# Patient Record
Sex: Female | Born: 1950 | Race: Black or African American | Hispanic: No | Marital: Married | Smoking: Former smoker
Health system: Southern US, Community
[De-identification: ages and names within clinical notes are randomized; demographics above are authoritative.]

## PROBLEM LIST (undated history)

## (undated) DIAGNOSIS — R12 Heartburn: Secondary | ICD-10-CM

## (undated) DIAGNOSIS — D122 Benign neoplasm of ascending colon: Secondary | ICD-10-CM

## (undated) DIAGNOSIS — E785 Hyperlipidemia, unspecified: Secondary | ICD-10-CM

## (undated) DIAGNOSIS — D529 Folate deficiency anemia, unspecified: Secondary | ICD-10-CM

## (undated) DIAGNOSIS — R1310 Dysphagia, unspecified: Secondary | ICD-10-CM

## (undated) DIAGNOSIS — D649 Anemia, unspecified: Secondary | ICD-10-CM

## (undated) DIAGNOSIS — D124 Benign neoplasm of descending colon: Secondary | ICD-10-CM

## (undated) DIAGNOSIS — R1319 Other dysphagia: Secondary | ICD-10-CM

## (undated) DIAGNOSIS — D125 Benign neoplasm of sigmoid colon: Secondary | ICD-10-CM

## (undated) DIAGNOSIS — I10 Essential (primary) hypertension: Secondary | ICD-10-CM

## (undated) DIAGNOSIS — M1990 Unspecified osteoarthritis, unspecified site: Secondary | ICD-10-CM

## (undated) DIAGNOSIS — J449 Chronic obstructive pulmonary disease, unspecified: Secondary | ICD-10-CM

## (undated) DIAGNOSIS — K219 Gastro-esophageal reflux disease without esophagitis: Secondary | ICD-10-CM

## (undated) HISTORY — DX: Benign neoplasm of sigmoid colon: D12.5

## (undated) HISTORY — DX: Heartburn: R12

## (undated) HISTORY — DX: Benign neoplasm of descending colon: D12.4

## (undated) HISTORY — DX: Anemia, unspecified: D64.9

## (undated) HISTORY — DX: Hyperlipidemia, unspecified: E78.5

## (undated) HISTORY — DX: Dysphagia, unspecified: R13.10

## (undated) HISTORY — DX: Chronic obstructive pulmonary disease, unspecified: J44.9

## (undated) HISTORY — DX: Benign neoplasm of ascending colon: D12.2

## (undated) HISTORY — DX: Gastro-esophageal reflux disease without esophagitis: K21.9

## (undated) HISTORY — DX: Folate deficiency anemia, unspecified: D52.9

## (undated) HISTORY — PX: ECTOPIC PREGNANCY SURGERY: SHX613

## (undated) HISTORY — DX: Essential (primary) hypertension: I10

## (undated) HISTORY — DX: Other dysphagia: R13.19

---

## 1999-06-15 ENCOUNTER — Emergency Department (HOSPITAL_COMMUNITY): Admission: EM | Admit: 1999-06-15 | Discharge: 1999-06-15 | Payer: Self-pay | Admitting: Emergency Medicine

## 1999-06-22 ENCOUNTER — Encounter: Admission: RE | Admit: 1999-06-22 | Discharge: 1999-06-22 | Payer: Self-pay | Admitting: Family Medicine

## 1999-06-22 ENCOUNTER — Encounter: Admission: RE | Admit: 1999-06-22 | Discharge: 1999-07-24 | Payer: Self-pay | Admitting: Family Medicine

## 1999-06-22 ENCOUNTER — Encounter: Payer: Self-pay | Admitting: Family Medicine

## 2005-05-10 ENCOUNTER — Ambulatory Visit: Payer: Self-pay

## 2005-11-22 ENCOUNTER — Ambulatory Visit: Payer: Self-pay

## 2007-01-05 ENCOUNTER — Ambulatory Visit: Payer: Self-pay | Admitting: Family Medicine

## 2009-02-10 ENCOUNTER — Ambulatory Visit: Payer: Self-pay | Admitting: Family Medicine

## 2009-05-20 ENCOUNTER — Ambulatory Visit: Payer: Self-pay | Admitting: Family Medicine

## 2010-02-18 ENCOUNTER — Ambulatory Visit: Payer: Self-pay | Admitting: Family Medicine

## 2010-03-24 ENCOUNTER — Ambulatory Visit: Payer: Self-pay | Admitting: Family Medicine

## 2011-01-08 ENCOUNTER — Ambulatory Visit: Payer: Self-pay | Admitting: Family Medicine

## 2011-05-18 ENCOUNTER — Ambulatory Visit: Payer: Self-pay | Admitting: Family Medicine

## 2014-09-02 ENCOUNTER — Other Ambulatory Visit: Payer: Self-pay | Admitting: Family Medicine

## 2014-09-02 DIAGNOSIS — E785 Hyperlipidemia, unspecified: Secondary | ICD-10-CM

## 2014-09-02 DIAGNOSIS — I10 Essential (primary) hypertension: Secondary | ICD-10-CM

## 2014-10-03 ENCOUNTER — Other Ambulatory Visit: Payer: Self-pay | Admitting: Family Medicine

## 2014-10-03 DIAGNOSIS — E785 Hyperlipidemia, unspecified: Secondary | ICD-10-CM

## 2014-10-03 DIAGNOSIS — I10 Essential (primary) hypertension: Secondary | ICD-10-CM

## 2014-10-04 ENCOUNTER — Other Ambulatory Visit: Payer: Self-pay | Admitting: Family Medicine

## 2014-10-04 DIAGNOSIS — I10 Essential (primary) hypertension: Secondary | ICD-10-CM

## 2014-10-18 ENCOUNTER — Other Ambulatory Visit: Payer: Self-pay | Admitting: Family Medicine

## 2014-10-18 DIAGNOSIS — I10 Essential (primary) hypertension: Secondary | ICD-10-CM

## 2014-10-18 DIAGNOSIS — E785 Hyperlipidemia, unspecified: Secondary | ICD-10-CM

## 2014-10-29 ENCOUNTER — Ambulatory Visit (INDEPENDENT_AMBULATORY_CARE_PROVIDER_SITE_OTHER): Payer: BC Managed Care – PPO | Admitting: Family Medicine

## 2014-10-29 ENCOUNTER — Encounter: Payer: Self-pay | Admitting: Family Medicine

## 2014-10-29 VITALS — BP 110/80 | HR 80 | Ht 67.0 in | Wt 153.0 lb

## 2014-10-29 DIAGNOSIS — R69 Illness, unspecified: Secondary | ICD-10-CM

## 2014-10-29 DIAGNOSIS — D520 Dietary folate deficiency anemia: Secondary | ICD-10-CM

## 2014-10-29 DIAGNOSIS — Z1239 Encounter for other screening for malignant neoplasm of breast: Secondary | ICD-10-CM | POA: Diagnosis not present

## 2014-10-29 DIAGNOSIS — E785 Hyperlipidemia, unspecified: Secondary | ICD-10-CM

## 2014-10-29 DIAGNOSIS — I10 Essential (primary) hypertension: Secondary | ICD-10-CM | POA: Diagnosis not present

## 2014-10-29 DIAGNOSIS — Z1211 Encounter for screening for malignant neoplasm of colon: Secondary | ICD-10-CM

## 2014-10-29 DIAGNOSIS — G629 Polyneuropathy, unspecified: Secondary | ICD-10-CM

## 2014-10-29 LAB — HEMOCCULT GUIAC POC 1CARD (OFFICE): Fecal Occult Blood, POC: NEGATIVE

## 2014-10-29 MED ORDER — METOPROLOL SUCCINATE ER 50 MG PO TB24: 50.0000 mg | ORAL_TABLET | Freq: Every day | ORAL | Status: DC

## 2014-10-29 MED ORDER — LOSARTAN POTASSIUM 100 MG PO TABS: 100.0000 mg | ORAL_TABLET | Freq: Every day | ORAL | Status: DC

## 2014-10-29 MED ORDER — PRAVASTATIN SODIUM 40 MG PO TABS: 40.0000 mg | ORAL_TABLET | Freq: Every day | ORAL | Status: DC

## 2014-10-29 MED ORDER — FOLIC ACID 0.8 MG PO CAPS: 0.4000 mg | ORAL_CAPSULE | Freq: Every day | ORAL | Status: DC

## 2014-10-29 NOTE — Progress Notes (Signed)
Name: Taylor Griffin   MRN: [id number]    DOB: August 08, 1950   Date:10/29/2014       Progress Note  Subjective  Chief Complaint  Chief Complaint  Patient presents with  . Hypertension  . Hyperlipidemia  . Anemia    Hypertension This is a chronic problem. The current episode started more than 1 year ago. The problem has been gradually improving since onset. The problem is controlled. Pertinent negatives include no anxiety, blurred vision, chest pain, headaches, malaise/fatigue, neck pain, orthopnea, palpitations, peripheral edema, PND, shortness of breath or sweats. There are no associated agents to hypertension. Risk factors for coronary artery disease include dyslipidemia, diabetes mellitus, post-menopausal state and smoking/tobacco exposure. Past treatments include angiotensin blockers and beta blockers. The current treatment provides no improvement. There are no compliance problems.  There is no history of angina, kidney disease, CAD/MI, CVA, heart failure, left ventricular hypertrophy, PVD, renovascular disease or retinopathy. There is no history of chronic renal disease.  Hyperlipidemia This is a chronic problem. The current episode started more than 1 year ago. The problem is controlled. Recent lipid tests were reviewed and are normal. She has no history of chronic renal disease, diabetes, hypothyroidism, liver disease, obesity or nephrotic syndrome. Factors aggravating her hyperlipidemia include beta blockers. Associated symptoms include a focal sensory loss. Pertinent negatives include no chest pain, focal weakness, leg pain, myalgias or shortness of breath. She is currently on no antihyperlipidemic treatment. The current treatment provides moderate improvement of lipids. There are no compliance problems.   Anemia Presents for follow-up visit. There has been no abdominal pain, anorexia, bruising/bleeding easily, confusion, fever, leg swelling, light-headedness, malaise/fatigue, pallor,  palpitations, paresthesias, pica or weight loss. Signs of blood loss that are not present include hematemesis, hematochezia, melena and menorrhagia. Past treatments include folic acid. There is no history of alcohol abuse, cancer, chronic liver disease, chronic renal disease, clotting disorder, dementia, heart failure, hemoglobinopathy, HIV/AIDS, hypothyroidism, inflammatory bowel disease, malabsorption, malnutrition, neuropathy, recent illness, recent surgery, recent trauma or rheumatic disease.  Neurologic Problem The patient's primary symptoms include focal sensory loss. The patient's pertinent negatives include no focal weakness, loss of balance or weakness. This is a recurrent problem. The current episode started more than 1 year ago. The neurological problem developed gradually. The problem is unchanged. There was lower extremity focality noted. Pertinent negatives include no abdominal pain, back pain, chest pain, confusion, dizziness, fever, headaches, light-headedness, nausea, neck pain, palpitations or shortness of breath. Past treatments include nothing. There is no history of a clotting disorder or liver disease.    No problem-specific assessment & plan notes found for this encounter.   Past Medical History  Diagnosis Date  . Anemia   . Hyperlipidemia   . Hypertension     Past Surgical History  Procedure Laterality Date  . Ectopic pregnancy surgery      Family History  Problem Relation Age of Onset  . Diabetes Mother   . Hypertension Mother     Social History   Social History  . Marital Status: Married    Spouse Name: N/A  . Number of Children: N/A  . Years of Education: N/A   Occupational History  . Not on file.   Social History Main Topics  . Smoking status: Former Research scientist (life sciences)  . Smokeless tobacco: Not on file  . Alcohol Use: No  . Drug Use: No  . Sexual Activity: No   Other Topics Concern  . Not on file   Social History Narrative  .  No narrative on file     No Known Allergies   Review of Systems  Constitutional: Negative for fever, chills, weight loss and malaise/fatigue.  HENT: Negative for ear discharge, ear pain and sore throat.   Eyes: Negative for blurred vision.  Respiratory: Negative for cough, sputum production, shortness of breath and wheezing.   Cardiovascular: Negative for chest pain, palpitations, orthopnea, leg swelling and PND.  Gastrointestinal: Negative for heartburn, nausea, abdominal pain, diarrhea, constipation, blood in stool, melena, hematochezia, anorexia and hematemesis.  Genitourinary: Negative for dysuria, urgency, frequency, hematuria and menorrhagia.  Musculoskeletal: Negative for myalgias, back pain, joint pain and neck pain.  Skin: Negative for pallor and rash.  Neurological: Negative for dizziness, tingling, sensory change, focal weakness, weakness, light-headedness, headaches, paresthesias and loss of balance.  Endo/Heme/Allergies: Negative for environmental allergies and polydipsia. Does not bruise/bleed easily.  Psychiatric/Behavioral: Negative for depression, suicidal ideas and confusion. The patient is not nervous/anxious and does not have insomnia.      Objective  Filed Vitals:   10/29/14 0936  BP: 110/80  Pulse: 80  Height: 5\' 7"  (1.702 m)  Weight: 153 lb (69.4 kg)    Physical Exam  Constitutional: She is well-developed, well-nourished, and in no distress. No distress.  HENT:  Head: Normocephalic and atraumatic.  Right Ear: External ear normal.  Left Ear: External ear normal.  Nose: Nose normal.  Mouth/Throat: Oropharynx is clear and moist.  Eyes: Conjunctivae and EOM are normal. Pupils are equal, round, and reactive to light. Right eye exhibits no discharge. Left eye exhibits no discharge.  Neck: Normal range of motion. Neck supple. No JVD present. No thyromegaly present.  Cardiovascular: Normal rate, regular rhythm, normal heart sounds and intact distal pulses.  Exam reveals no gallop  and no friction rub.   No murmur heard. Pulmonary/Chest: Effort normal and breath sounds normal. Right breast exhibits no mass and no tenderness. Left breast exhibits no mass and no tenderness. Breasts are symmetrical.  Abdominal: Soft. Bowel sounds are normal. She exhibits no mass. There is no splenomegaly or hepatomegaly. There is no tenderness. There is no guarding.  Genitourinary: Rectum normal. Rectal exam shows no external hemorrhoid, no internal hemorrhoid, no mass and no tenderness. Guaiac negative stool.  Musculoskeletal: Normal range of motion. She exhibits no edema.  Lymphadenopathy:    She has no cervical adenopathy.  Neurological: She is alert. She has normal reflexes.  Skin: Skin is warm and dry. She is not diaphoretic.  Psychiatric: Mood and affect normal.  Nursing note and vitals reviewed.     Assessment & Plan  Problem List Items Addressed This Visit    None    Visit Diagnoses    Essential hypertension    -  Primary    Relevant Medications    aspirin 81 MG tablet    losartan (COZAAR) 100 MG tablet    metoprolol succinate (TOPROL-XL) 50 MG 24 hr tablet    pravastatin (PRAVACHOL) 40 MG tablet    Other Relevant Orders    Renal Function Panel    POCT Urinalysis Dipstick    Hyperlipidemia        Relevant Medications    aspirin 81 MG tablet    losartan (COZAAR) 100 MG tablet    metoprolol succinate (TOPROL-XL) 50 MG 24 hr tablet    pravastatin (PRAVACHOL) 40 MG tablet    Other Relevant Orders    Lipid Profile    Dietary folate deficiency anemia        Relevant Medications  Folic Acid 0.8 MG CAPS    Other Relevant Orders    CBC w/Diff/Platelet    Neuropathy        Relevant Orders    CBC w/Diff/Platelet    Taking medication for chronic disease        Relevant Orders    Hepatic function panel    Breast cancer screening        Relevant Orders    MM Digital Screening    Colon cancer screening        Relevant Orders    POCT Occult Blood Stool     Ambulatory referral to Gastroenterology         Dr. Otilio Miu [facility]  10/29/2014

## 2014-10-30 LAB — CBC WITH DIFFERENTIAL/PLATELET
BASOS ABS: 0.1 10*3/uL (ref 0.0–0.2)
BASOS: 1 %
EOS (ABSOLUTE): 0.3 10*3/uL (ref 0.0–0.4)
EOS: 5 %
HEMATOCRIT: 43.3 % (ref 34.0–46.6)
HEMOGLOBIN: 14.5 g/dL (ref 11.1–15.9)
IMMATURE GRANS (ABS): 0 10*3/uL (ref 0.0–0.1)
Immature Granulocytes: 0 %
LYMPHS ABS: 2.3 10*3/uL (ref 0.7–3.1)
LYMPHS: 43 %
MCH: 31.2 pg (ref 26.6–33.0)
MCHC: 33.5 g/dL (ref 31.5–35.7)
MCV: 93 fL (ref 79–97)
MONOCYTES: 7 %
Monocytes Absolute: 0.4 10*3/uL (ref 0.1–0.9)
NEUTROS ABS: 2.3 10*3/uL (ref 1.4–7.0)
Neutrophils: 44 %
Platelets: 284 10*3/uL (ref 150–379)
RBC: 4.65 x10E6/uL (ref 3.77–5.28)
RDW: 14.3 % (ref 12.3–15.4)
WBC: 5.3 10*3/uL (ref 3.4–10.8)

## 2014-10-30 LAB — LIPID PANEL
Chol/HDL Ratio: 4.9 ratio units — ABNORMAL HIGH (ref 0.0–4.4)
Cholesterol, Total: 241 mg/dL — ABNORMAL HIGH (ref 100–199)
HDL: 49 mg/dL (ref >39)
LDL Calculated: 155 mg/dL — ABNORMAL HIGH (ref 0–99)
TRIGLYCERIDES: 184 mg/dL — AB (ref 0–149)
VLDL Cholesterol Cal: 37 mg/dL (ref 5–40)

## 2014-10-30 LAB — RENAL FUNCTION PANEL: PHOSPHORUS: 3.6 mg/dL (ref 2.5–4.5)

## 2014-10-30 LAB — HEPATIC FUNCTION PANEL: BILIRUBIN, DIRECT: 0.18 mg/dL (ref 0.00–0.40)

## 2014-11-05 ENCOUNTER — Telehealth: Payer: Self-pay | Admitting: Gastroenterology

## 2014-11-05 ENCOUNTER — Other Ambulatory Visit: Payer: Self-pay

## 2014-11-05 ENCOUNTER — Ambulatory Visit
Admission: RE | Admit: 2014-11-05 | Discharge: 2014-11-05 | Disposition: A | Discharge status: Home / Self Care | Payer: BC Managed Care – PPO | Source: Ambulatory Visit | Attending: Family Medicine | Admitting: Family Medicine

## 2014-11-05 DIAGNOSIS — Z1212 Encounter for screening for malignant neoplasm of rectum: Principal | ICD-10-CM

## 2014-11-05 DIAGNOSIS — Z1211 Encounter for screening for malignant neoplasm of colon: Secondary | ICD-10-CM

## 2014-11-05 DIAGNOSIS — Z1231 Encounter for screening mammogram for malignant neoplasm of breast: Secondary | ICD-10-CM | POA: Insufficient documentation

## 2014-11-05 DIAGNOSIS — Z1239 Encounter for other screening for malignant neoplasm of breast: Secondary | ICD-10-CM

## 2014-11-05 NOTE — Telephone Encounter (Signed)
Gastroenterology Pre-Procedure Review  Request Date: 11-22-2014 Requesting Physician: Dr. Ronnald Ramp  PATIENT REVIEW QUESTIONS: The patient responded to the following health history questions as indicated:    1. Are you having any GI issues? no 2. Do you have a personal history of Polyps? no 3. Do you have a family history of Colon Cancer or Polyps? no 4. Diabetes Mellitus? no 5. Joint replacements in the past 12 months?no 6. Major health problems in the past 3 months?no 7. Any artificial heart valves, MVP, or defibrillator?no    MEDICATIONS & ALLERGIES:    Patient reports the following regarding taking any anticoagulation/antiplatelet therapy:   Plavix, Coumadin, Eliquis, Xarelto, Lovenox, Pradaxa, Brilinta, or Effient? no Aspirin? yes (Daily)  Patient confirms/reports the following medications:  Current Outpatient Prescriptions  Medication Sig Dispense Refill   aspirin 81 MG tablet Take 81 mg by mouth daily.     Folic Acid 0.8 MG CAPS Take 0.5 capsules (0.4 mg total) by mouth daily at 6 (six) AM. 30 each 6   losartan (COZAAR) 100 MG tablet Take 1 tablet (100 mg total) by mouth daily. 30 tablet 6   metoprolol succinate (TOPROL-XL) 50 MG 24 hr tablet Take 1 tablet (50 mg total) by mouth daily. Take with or immediately following a meal. 30 tablet 6   pravastatin (PRAVACHOL) 40 MG tablet Take 1 tablet (40 mg total) by mouth daily. 30 tablet 6   No current facility-administered medications for this visit.    Patient confirms/reports the following allergies:  No Known Allergies  No orders of the defined types were placed in this encounter.    AUTHORIZATION INFORMATION Primary Insurance: 1D#: Group #:  Secondary Insurance: 1D#: Group #:  SCHEDULE INFORMATION: Date: 11-22-2014 Time: Location:[facility]

## 2014-11-21 NOTE — Discharge Instructions (Signed)

## 2014-11-22 ENCOUNTER — Ambulatory Visit: Payer: BC Managed Care – PPO | Admitting: Anesthesiology

## 2014-11-22 ENCOUNTER — Encounter
Admission: RE | Disposition: A | Discharge status: Home / Self Care | Payer: Self-pay | Source: Ambulatory Visit | Attending: Gastroenterology

## 2014-11-22 ENCOUNTER — Other Ambulatory Visit: Payer: Self-pay | Admitting: Gastroenterology

## 2014-11-22 ENCOUNTER — Ambulatory Visit
Admission: RE | Admit: 2014-11-22 | Discharge: 2014-11-22 | Disposition: A | Discharge status: Home / Self Care | Payer: BC Managed Care – PPO | Source: Ambulatory Visit | Attending: Gastroenterology | Admitting: Gastroenterology

## 2014-11-22 DIAGNOSIS — F17210 Nicotine dependence, cigarettes, uncomplicated: Secondary | ICD-10-CM | POA: Diagnosis not present

## 2014-11-22 DIAGNOSIS — Z79899 Other long term (current) drug therapy: Secondary | ICD-10-CM | POA: Diagnosis not present

## 2014-11-22 DIAGNOSIS — D649 Anemia, unspecified: Secondary | ICD-10-CM | POA: Diagnosis not present

## 2014-11-22 DIAGNOSIS — E785 Hyperlipidemia, unspecified: Secondary | ICD-10-CM | POA: Diagnosis not present

## 2014-11-22 DIAGNOSIS — D125 Benign neoplasm of sigmoid colon: Secondary | ICD-10-CM

## 2014-11-22 DIAGNOSIS — Z1211 Encounter for screening for malignant neoplasm of colon: Secondary | ICD-10-CM | POA: Diagnosis present

## 2014-11-22 DIAGNOSIS — K640 First degree hemorrhoids: Secondary | ICD-10-CM | POA: Diagnosis not present

## 2014-11-22 DIAGNOSIS — Z9889 Other specified postprocedural states: Secondary | ICD-10-CM | POA: Insufficient documentation

## 2014-11-22 DIAGNOSIS — I10 Essential (primary) hypertension: Secondary | ICD-10-CM | POA: Insufficient documentation

## 2014-11-22 DIAGNOSIS — Z833 Family history of diabetes mellitus: Secondary | ICD-10-CM | POA: Diagnosis not present

## 2014-11-22 DIAGNOSIS — Z7982 Long term (current) use of aspirin: Secondary | ICD-10-CM | POA: Insufficient documentation

## 2014-11-22 DIAGNOSIS — D124 Benign neoplasm of descending colon: Secondary | ICD-10-CM | POA: Diagnosis not present

## 2014-11-22 DIAGNOSIS — K635 Polyp of colon: Secondary | ICD-10-CM | POA: Insufficient documentation

## 2014-11-22 DIAGNOSIS — Z8249 Family history of ischemic heart disease and other diseases of the circulatory system: Secondary | ICD-10-CM | POA: Insufficient documentation

## 2014-11-22 DIAGNOSIS — D122 Benign neoplasm of ascending colon: Secondary | ICD-10-CM | POA: Diagnosis not present

## 2014-11-22 DIAGNOSIS — M1990 Unspecified osteoarthritis, unspecified site: Secondary | ICD-10-CM | POA: Insufficient documentation

## 2014-11-22 DIAGNOSIS — K5730 Diverticulosis of large intestine without perforation or abscess without bleeding: Secondary | ICD-10-CM | POA: Insufficient documentation

## 2014-11-22 HISTORY — PX: COLONOSCOPY WITH PROPOFOL: SHX5780

## 2014-11-22 HISTORY — DX: Unspecified osteoarthritis, unspecified site: M19.90

## 2014-11-22 SURGERY — COLONOSCOPY WITH PROPOFOL
Anesthesia: Monitor Anesthesia Care | Wound class: Contaminated

## 2014-11-22 MED ORDER — LACTATED RINGERS IV SOLN
INTRAVENOUS | Status: DC
  Administered 2014-11-22 (×2): via INTRAVENOUS

## 2014-11-22 MED ORDER — OXYCODONE HCL 5 MG/5ML PO SOLN: 5.0000 mg | Freq: Once | ORAL | Status: DC | PRN

## 2014-11-22 MED ORDER — PROPOFOL 10 MG/ML IV BOLUS
INTRAVENOUS | Status: DC | PRN
  Administered 2014-11-22: 80 mg via INTRAVENOUS
  Administered 2014-11-22: 40 mg via INTRAVENOUS
  Administered 2014-11-22: 80 mg via INTRAVENOUS

## 2014-11-22 MED ORDER — DEXAMETHASONE SODIUM PHOSPHATE 4 MG/ML IJ SOLN
8.0000 mg | Freq: Once | INTRAMUSCULAR | Status: DC | PRN
Start: 2014-11-22 — End: 2014-11-22

## 2014-11-22 MED ORDER — ACETAMINOPHEN 325 MG PO TABS: 325.0000 mg | ORAL_TABLET | ORAL | Status: DC | PRN

## 2014-11-22 MED ORDER — ACETAMINOPHEN 160 MG/5ML PO SOLN: 325.0000 mg | ORAL | Status: DC | PRN

## 2014-11-22 MED ORDER — FENTANYL CITRATE (PF) 100 MCG/2ML IJ SOLN: 25.0000 ug | INTRAMUSCULAR | Status: DC | PRN

## 2014-11-22 MED ORDER — STERILE WATER FOR IRRIGATION IR SOLN
Status: DC | PRN
  Administered 2014-11-22: 08:00:00

## 2014-11-22 MED ORDER — OXYCODONE HCL 5 MG PO TABS: 5.0000 mg | ORAL_TABLET | Freq: Once | ORAL | Status: DC | PRN

## 2014-11-22 MED ORDER — LACTATED RINGERS IV SOLN: 500.0000 mL | INTRAVENOUS | Status: DC

## 2014-11-22 MED ORDER — LIDOCAINE HCL (CARDIAC) 20 MG/ML IV SOLN
INTRAVENOUS | Status: DC | PRN
  Administered 2014-11-22: 30 mg via INTRAVENOUS

## 2014-11-22 SURGICAL SUPPLY — 28 items
CANISTER SUCT 1200ML W/VALVE (MISCELLANEOUS) ×2 IMPLANT
FCP ESCP3.2XJMB 240X2.8X (MISCELLANEOUS)
FORCEPS BIOP RAD 4 LRG CAP 4 (CUTTING FORCEPS) ×2 IMPLANT
FORCEPS BIOP RJ4 240 W/NDL (MISCELLANEOUS)
FORCEPS ESCP3.2XJMB 240X2.8X (MISCELLANEOUS) IMPLANT
GOWN CVR UNV OPN BCK APRN NK (MISCELLANEOUS) ×2 IMPLANT
GOWN ISOL THUMB LOOP REG UNIV (MISCELLANEOUS) ×2
HEMOCLIP INSTINCT (CLIP) IMPLANT
INJECTOR VARIJECT VIN23 (MISCELLANEOUS) IMPLANT
KIT CO2 TUBING (TUBING) IMPLANT
KIT DEFENDO VALVE AND CONN (KITS) IMPLANT
KIT ENDO PROCEDURE OLY (KITS) ×2 IMPLANT
LIGATOR MULTIBAND 6SHOOTER MBL (MISCELLANEOUS) IMPLANT
MARKER SPOT ENDO TATTOO 5ML (MISCELLANEOUS) IMPLANT
PAD GROUND ADULT SPLIT (MISCELLANEOUS) IMPLANT
SNARE SHORT THROW 13M SML OVAL (MISCELLANEOUS) IMPLANT
SNARE SHORT THROW 30M LRG OVAL (MISCELLANEOUS) IMPLANT
SPOT EX ENDOSCOPIC TATTOO (MISCELLANEOUS)
SUCTION POLY TRAP 4CHAMBER (MISCELLANEOUS) IMPLANT
TRAP SUCTION POLY (MISCELLANEOUS) IMPLANT
TUBING CONN 6MMX3.1M (TUBING)
TUBING SUCTION CONN 0.25 STRL (TUBING) IMPLANT
UNDERPAD 30X60 958B10 (PK) (MISCELLANEOUS) IMPLANT
VALVE BIOPSY ENDO (VALVE) IMPLANT
VARIJECT INJECTOR VIN23 (MISCELLANEOUS)
WATER AUXILLARY (MISCELLANEOUS) IMPLANT
WATER STERILE IRR 250ML POUR (IV SOLUTION) ×2 IMPLANT
WATER STERILE IRR 500ML POUR (IV SOLUTION) IMPLANT

## 2014-11-22 NOTE — H&P (Signed)
  [facility]  [address] [place], [place] [address] Phone: [phone] Fax : [phone]  Primary Care Physician:  Otilio Miu, MD Primary Gastroenterologist:  Dr. Allen Norris  Pre-Procedure History & Physical: HPI:  Taylor Griffin is a 64 y.o. female is here for a screening colonoscopy.   Past Medical History  Diagnosis Date  . Hyperlipidemia   . Arthritis     WRIST AND FINGERS  . Hypertension     CONTROLLED WITH MEDS  . Anemia     Past Surgical History  Procedure Laterality Date  . Ectopic pregnancy surgery      Prior to Admission medications   Medication Sig Start Date End Date Taking? Authorizing Provider  aspirin 81 MG tablet Take 81 mg by mouth daily. AM   Yes Historical Provider, MD  Folic Acid 0.8 MG CAPS Take 0.5 capsules (0.4 mg total) by mouth daily at 6 (six) AM. 10/29/14  Yes Juline Patch, MD  losartan (COZAAR) 100 MG tablet Take 1 tablet (100 mg total) by mouth daily. 10/29/14  Yes Juline Patch, MD  metoprolol succinate (TOPROL-XL) 50 MG 24 hr tablet Take 1 tablet (50 mg total) by mouth daily. Take with or immediately following a meal. 10/29/14  Yes Juline Patch, MD  pravastatin (PRAVACHOL) 40 MG tablet Take 1 tablet (40 mg total) by mouth daily. 10/29/14  Yes Juline Patch, MD    Allergies as of 11/05/2014  . (No Known Allergies)    Family History  Problem Relation Age of Onset  . Diabetes Mother   . Hypertension Mother     Social History   Social History  . Marital Status: Married    Spouse Name: N/A  . Number of Children: N/A  . Years of Education: N/A   Occupational History  . Not on file.   Social History Main Topics  . Smoking status: Former Smoker -- 0.50 packs/day for 18 years    Types: Cigarettes  . Smokeless tobacco: Not on file  . Alcohol Use: No  . Drug Use: No  . Sexual Activity: No   Other Topics Concern  . Not on file   Social History Narrative    Review of Systems: See HPI, otherwise  negative ROS  Physical Exam: BP 139/84 mmHg  Pulse 61  Temp(Src) 97.9 F (36.6 C) (Temporal)  Resp 16  Ht 5\' 7"  (1.702 m)  Wt 150 lb (68.04 kg)  BMI 23.49 kg/m2  SpO2 97% General:   Alert,  pleasant and cooperative in NAD Head:  Normocephalic and atraumatic. Neck:  Supple; no masses or thyromegaly. Lungs:  Clear throughout to auscultation.    Heart:  Regular rate and rhythm. Abdomen:  Soft, nontender and nondistended. Normal bowel sounds, without guarding, and without rebound.   Neurologic:  Alert and  oriented x4;  grossly normal neurologically.  Impression/Plan: Taylor Griffin is now here to undergo a screening colonoscopy.  Risks, benefits, and alternatives regarding colonoscopy have been reviewed with the patient.  Questions have been answered.  All parties agreeable.

## 2014-11-22 NOTE — Anesthesia Procedure Notes (Signed)
Procedure Name: MAC Performed by: LEBLANC, MONIQUE Pre-anesthesia Checklist: Patient identified, Emergency Drugs available, Suction available, Patient being monitored and Timeout performed Patient Re-evaluated:Patient Re-evaluated prior to inductionOxygen Delivery Method: Nasal cannula       

## 2014-11-22 NOTE — Anesthesia Postprocedure Evaluation (Signed)
  Anesthesia Post-op Note  Patient: Taylor Griffin  Procedure(s) Performed: Procedure(s) with comments: COLONOSCOPY WITH PROPOFOL (N/A) - LATEX ALLERGY  Anesthesia type:MAC  Patient location: PACU  Post pain: Pain level controlled  Post assessment: Post-op Vital signs reviewed, Patient's Cardiovascular Status Stable, Respiratory Function Stable, Patent Airway and No signs of Nausea or vomiting  Post vital signs: Reviewed and stable  Last Vitals:  Filed Vitals:   11/22/14 0758  BP:   Pulse: 58  Temp:   Resp: 22    Level of consciousness: awake, alert  and patient cooperative  Complications: No apparent anesthesia complications

## 2014-11-22 NOTE — Op Note (Signed)
[facility] Gastroenterology Patient Name: Taylor Griffin Procedure Date: 11/22/2014 7:18 AM MRN: [id number] Account #: [id number] Date of Birth: 07-18-1950 Admit Type: Outpatient Age: 64 Room: [facility] OR ROOM 01 Gender: Female Note Status: Finalized Procedure:         Colonoscopy Indications:       Screening for colorectal malignant neoplasm Providers:         Lucilla Lame, MD Referring MD:      Juline Patch, MD (Referring MD) Medicines:         Propofol per Anesthesia Complications:     No immediate complications. Procedure:         Pre-Anesthesia Assessment:                    - Prior to the procedure, a History and Physical was                     performed, and patient medications and allergies were                     reviewed. The patient's tolerance of previous anesthesia                     was also reviewed. The risks and benefits of the procedure                     and the sedation options and risks were discussed with the                     patient. All questions were answered, and informed consent                     was obtained. Prior Anticoagulants: The patient has taken                     no previous anticoagulant or antiplatelet agents. ASA                     Grade Assessment: II - A patient with mild systemic                     disease. After reviewing the risks and benefits, the                     patient was deemed in satisfactory condition to undergo                     the procedure.                    After obtaining informed consent, the colonoscope was                     passed under direct vision. Throughout the procedure, the                     patient's blood pressure, pulse, and oxygen saturations                     were monitored continuously. The Olympus CF H180AL                     colonoscope (S#: [id number]) was introduced through the anus  and advanced to the the cecum, identified by appendiceal                orifice and ileocecal valve. The colonoscopy was performed                     without difficulty. The patient tolerated the procedure                     well. The quality of the bowel preparation was excellent. Findings:      The perianal and digital rectal examinations were normal.      A 3 mm polyp was found in the ascending colon. The polyp was sessile.       The polyp was removed with a cold biopsy forceps. Resection and       retrieval were complete.      A 4 mm polyp was found in the descending colon. The polyp was sessile.       The polyp was removed with a cold biopsy forceps. Resection and       retrieval were complete.      A 3 mm polyp was found in the sigmoid colon. The polyp was sessile. The       polyp was removed with a cold biopsy forceps. Resection and retrieval       were complete.      Non-bleeding internal hemorrhoids were found during retroflexion. The       hemorrhoids were Grade I (internal hemorrhoids that do not prolapse).      Multiple small-mouthed diverticula were found in the sigmoid colon. Impression:        - One 3 mm polyp in the ascending colon. Resected and                     retrieved.                    - One 4 mm polyp in the descending colon. Resected and                     retrieved.                    - One 3 mm polyp in the sigmoid colon. Resected and                     retrieved.                    - Non-bleeding internal hemorrhoids.                    - Diverticulosis in the sigmoid colon. Recommendation:    - Await pathology results.                    - Repeat colonoscopy in 5 years if polyp adenoma and 10                     years if hyperplastic Procedure Code(s): --- Professional ---                    [phone], Colonoscopy, flexible; with biopsy, single or                     multiple Diagnosis Code(s): --- Professional ---  Z12.11, Encounter for screening for malignant neoplasm of                      colon                    D12.2, Benign neoplasm of ascending colon                    D12.4, Benign neoplasm of descending colon                    D12.5, Benign neoplasm of sigmoid colon CPT copyright 2014 American Medical Association. All rights reserved. The codes documented in this report are preliminary and upon coder review may  be revised to meet current compliance requirements. Lucilla Lame, MD 11/22/2014 7:50:17 AM This report has been signed electronically. Number of Addenda: 0 Note Initiated On: 11/22/2014 7:18 AM Scope Withdrawal Time: 0 hours 7 minutes 3 seconds  Total Procedure Duration: 0 hours 10 minutes 24 seconds       [facility]

## 2014-11-22 NOTE — Anesthesia Preprocedure Evaluation (Signed)
Anesthesia Evaluation  Patient identified by MRN, date of birth, ID band Patient awake    Reviewed: Allergy & Precautions, H&P , NPO status , Patient's Chart, lab work & pertinent test results, reviewed documented beta blocker date and time   Airway Mallampati: II  TM Distance: >3 FB Neck ROM: full    Dental no notable dental hx.    Pulmonary former smoker,    Pulmonary exam normal breath sounds clear to auscultation       Cardiovascular Exercise Tolerance: Good hypertension, On Medications and On Home Beta Blockers  Rhythm:regular Rate:Normal     Neuro/Psych negative neurological ROS  negative psych ROS   GI/Hepatic negative GI ROS, Neg liver ROS,   Endo/Other  negative endocrine ROS  Renal/GU negative Renal ROS  negative genitourinary   Musculoskeletal   Abdominal   Peds  Hematology  (+) anemia ,   Anesthesia Other Findings   Reproductive/Obstetrics negative OB ROS                             Anesthesia Physical Anesthesia Plan  ASA: II  Anesthesia Plan: MAC   Post-op Pain Management:    Induction:   Airway Management Planned:   Additional Equipment:   Intra-op Plan:   Post-operative Plan:   Informed Consent: I have reviewed the patients History and Physical, chart, labs and discussed the procedure including the risks, benefits and alternatives for the proposed anesthesia with the patient or authorized representative who has indicated his/her understanding and acceptance.     Plan Discussed with: CRNA  Anesthesia Plan Comments:         Anesthesia Quick Evaluation

## 2014-11-22 NOTE — Transfer of Care (Signed)
Immediate Anesthesia Transfer of Care Note  Patient: Taylor Griffin  Procedure(s) Performed: Procedure(s) with comments: COLONOSCOPY WITH PROPOFOL (N/A) - LATEX ALLERGY  Patient Location: PACU  Anesthesia Type: MAC  Level of Consciousness: awake, alert  and patient cooperative  Airway and Oxygen Therapy: Patient Spontanous Breathing and Patient connected to supplemental oxygen  Post-op Assessment: Post-op Vital signs reviewed, Patient's Cardiovascular Status Stable, Respiratory Function Stable, Patent Airway and No signs of Nausea or vomiting  Post-op Vital Signs: Reviewed and stable  Complications: No apparent anesthesia complications

## 2014-11-25 ENCOUNTER — Encounter: Payer: Self-pay | Admitting: Gastroenterology

## 2014-11-26 ENCOUNTER — Encounter: Payer: Self-pay | Admitting: Gastroenterology

## 2015-03-13 ENCOUNTER — Encounter (HOSPITAL_COMMUNITY): Payer: Self-pay

## 2015-03-13 ENCOUNTER — Emergency Department (INDEPENDENT_AMBULATORY_CARE_PROVIDER_SITE_OTHER)
Admission: EM | Admit: 2015-03-13 | Discharge: 2015-03-13 | Disposition: A | Discharge status: Home / Self Care | Payer: PPO | Source: Home / Self Care | Attending: Family Medicine | Admitting: Family Medicine

## 2015-03-13 ENCOUNTER — Emergency Department (INDEPENDENT_AMBULATORY_CARE_PROVIDER_SITE_OTHER): Payer: PPO

## 2015-03-13 DIAGNOSIS — M19041 Primary osteoarthritis, right hand: Secondary | ICD-10-CM

## 2015-03-13 MED ORDER — CEPHALEXIN 500 MG PO CAPS: 500.0000 mg | ORAL_CAPSULE | Freq: Three times a day (TID) | ORAL | Status: DC

## 2015-03-13 MED ORDER — NAPROXEN 500 MG PO TABS: 500.0000 mg | ORAL_TABLET | Freq: Two times a day (BID) | ORAL | Status: DC

## 2015-03-13 NOTE — ED Notes (Signed)
Patient complains of right pinky finger being swollen for the last two days Denies any injury or bug bite Very painful

## 2015-03-13 NOTE — ED Provider Notes (Signed)
CSN: [id number]     Arrival date & time 03/13/15  1846 History   First MD Initiated Contact with Patient 03/13/15 1932     Chief Complaint  Patient presents with  . Hand Pain   (Consider location/radiation/quality/duration/timing/severity/associated sxs/prior Treatment) HPI Red swollen right pinky finger 2 days no known injury no known bug bite. He states most of the pain is at the PIP. No history of arthritis. Past Medical History  Diagnosis Date  . Hyperlipidemia   . Arthritis     WRIST AND FINGERS  . Hypertension     CONTROLLED WITH MEDS  . Anemia    Past Surgical History  Procedure Laterality Date  . Ectopic pregnancy surgery    . Colonoscopy with propofol N/A 11/22/2014    Procedure: COLONOSCOPY WITH PROPOFOL;  Surgeon: Lucilla Lame, MD;  Location: [facility];  Service: Endoscopy;  Laterality: N/A;  LATEX ALLERGY   Family History  Problem Relation Age of Onset  . Diabetes Mother   . Hypertension Mother    Social History  Substance Use Topics  . Smoking status: Former Smoker -- 0.50 packs/day for 18 years    Types: Cigarettes  . Smokeless tobacco: None  . Alcohol Use: No   OB History    No data available     Review of Systems Positive for right finger swelling negative for numbness Allergies  Latex  Home Medications   Prior to Admission medications   Medication Sig Start Date End Date Taking? Authorizing Provider  aspirin 81 MG tablet Take 81 mg by mouth daily. AM    Historical Provider, MD  Folic Acid 0.8 MG CAPS Take 0.5 capsules (0.4 mg total) by mouth daily at 6 (six) AM. 10/29/14   Juline Patch, MD  losartan (COZAAR) 100 MG tablet Take 1 tablet (100 mg total) by mouth daily. 10/29/14   Juline Patch, MD  metoprolol succinate (TOPROL-XL) 50 MG 24 hr tablet Take 1 tablet (50 mg total) by mouth daily. Take with or immediately following a meal. 10/29/14   Juline Patch, MD  pravastatin (PRAVACHOL) 40 MG tablet Take 1 tablet (40 mg total) by mouth  daily. 10/29/14   Juline Patch, MD   Meds Ordered and Administered this Visit  Medications - No data to display  There were no vitals taken for this visit. No data found.   Physical Exam  Constitutional: She appears well-developed and well-nourished.  Musculoskeletal:       Right hand: She exhibits decreased range of motion and tenderness.       Hands: Right pinky finger read on the medial aspect of the finger tender over the PIP not fluctuant no signs of external injury.    ED Course  Procedures (including critical care time)  Labs Review Labs Reviewed - No data to display  Imaging Review No results found.   Visual Acuity Review  Right Eye Distance:   Left Eye Distance:   Bilateral Distance:    Right Eye Near:   Left Eye Near:    Bilateral Near:         MDM   1. Inflammation of joint of finger of right hand    Patient is advised to continue home symptomatic treatment. Prescription for naproxen, keflex  sent pharmacy patient has indicated. Patient is advised that if there are new or worsening symptoms or attend the emergency department, or contact primary care provider. Instructions of care provided discharged home in stable condition.  THIS NOTE  WAS GENERATED USING A VOICE RECOGNITION SOFTWARE PROGRAM. ALL REASONABLE EFFORTS  WERE MADE TO PROOFREAD THIS DOCUMENT FOR ACCURACY.     Konrad Felix, [place] 03/13/15 2057

## 2015-03-13 NOTE — Discharge Instructions (Signed)
Osteoarthritis °Osteoarthritis is a disease that causes soreness and inflammation of a joint. It occurs when the cartilage at the affected joint wears down. Cartilage acts as a cushion, covering the ends of bones where they meet to form a joint. Osteoarthritis is the most common form of arthritis. It often occurs in older people. The joints affected most often by this condition include those in the: °· Ends of the fingers. °· Thumbs. °· Neck. °· Lower back. °· Knees. °· Hips. °CAUSES  °Over time, the cartilage that covers the ends of bones begins to wear away. This causes bone to rub on bone, producing pain and stiffness in the affected joints.  °RISK FACTORS °Certain factors can increase your chances of having osteoarthritis, including: °· Older age. °· Excessive body weight. °· Overuse of joints. °· Previous joint injury. °SIGNS AND SYMPTOMS  °· Pain, swelling, and stiffness in the joint. °· Over time, the joint may lose its normal shape. °· Small deposits of bone (osteophytes) may grow on the edges of the joint. °· Bits of bone or cartilage can break off and float inside the joint space. This may cause more pain and damage. °DIAGNOSIS  °Your health care provider will do a physical exam and ask about your symptoms. Various tests may be ordered, such as: °· X-rays of the affected joint. °· Blood tests to rule out other types of arthritis. °Additional tests may be used to diagnose your condition. °TREATMENT  °Goals of treatment are to control pain and improve joint function. Treatment plans may include: °· A prescribed exercise program that allows for rest and joint relief. °· A weight control plan. °· Pain relief techniques, such as: °¨ Properly applied heat and cold. °¨ Electric pulses delivered to nerve endings under the skin (transcutaneous electrical nerve stimulation [TENS]). °¨ Massage. °¨ Certain nutritional supplements. °· Medicines to control pain, such as: °¨ Acetaminophen. °¨ Nonsteroidal  anti-inflammatory drugs (NSAIDs), such as naproxen. °¨ Narcotic or central-acting agents, such as tramadol. °¨ Corticosteroids. These can be given orally or as an injection. °· Surgery to reposition the bones and relieve pain (osteotomy) or to remove loose pieces of bone and cartilage. Joint replacement may be needed in advanced states of osteoarthritis. °HOME CARE INSTRUCTIONS  °· Take medicines only as directed by your health care provider. °· Maintain a healthy weight. Follow your health care provider's instructions for weight control. This may include dietary instructions. °· Exercise as directed. Your health care provider can recommend specific types of exercise. These may include: °¨ Strengthening exercises. These are done to strengthen the muscles that support joints affected by arthritis. They can be performed with weights or with exercise bands to add resistance. °¨ Aerobic activities. These are exercises, such as brisk walking or low-impact aerobics, that get your heart pumping. °¨ Range-of-motion activities. These keep your joints limber. °¨ Balance and agility exercises. These help you maintain daily living skills. °· Rest your affected joints as directed by your health care provider. °· Keep all follow-up visits as directed by your health care provider. °SEEK MEDICAL CARE IF:  °· Your skin turns red. °· You develop a rash in addition to your joint pain. °· You have worsening joint pain. °· You have a fever along with joint or muscle aches. °SEEK IMMEDIATE MEDICAL CARE IF: °· You have a significant loss of weight or appetite. °· You have night sweats. °FOR MORE INFORMATION  °· National Institute of Arthritis and Musculoskeletal and Skin Diseases: www.niams.nih.gov °· National Institute on   Aging: www.nia.nih.gov °· American College of Rheumatology: www.rheumatology.org °  °This information is not intended to replace advice given to you by your health care provider. Make sure you discuss any questions you  have with your health care provider. °  °Document Released: 02/22/2005 Document Revised: 03/15/2014 Document Reviewed: 10/30/2012 °Elsevier Interactive Patient Education ©2016 [employer] ° °Heat Therapy °Heat therapy can help ease sore, stiff, injured, and tight muscles and joints. Heat relaxes your muscles, which may help ease your pain. Heat therapy should only be used on old, pre-existing, or long-lasting (chronic) injuries. Do not use heat therapy unless told by your doctor. °HOW TO USE HEAT THERAPY °There are several different kinds of heat therapy, including: °· Moist heat pack. °· Warm water bath. °· Hot water bottle. °· Electric heating pad. °· Heated gel pack. °· Heated wrap. °· Electric heating pad. °GENERAL HEAT THERAPY RECOMMENDATIONS  °· Do not sleep while using heat therapy. Only use heat therapy while you are awake. °· Your skin may turn pink while using heat therapy. Do not use heat therapy if your skin turns red. °· Do not use heat therapy if you have new pain. °· High heat or long exposure to heat can cause burns. Be careful when using heat therapy to avoid burning your skin. °· Do not use heat therapy on areas of your skin that are already irritated, such as with a rash or sunburn. °GET HELP IF:  °· You have blisters, redness, swelling (puffiness), or numbness. °· You have new pain. °· Your pain is worse. °MAKE SURE YOU: °· Understand these instructions. °· Will watch your condition. °· Will get help right away if you are not doing well or get worse. °  °This information is not intended to replace advice given to you by your health care provider. Make sure you discuss any questions you have with your health care provider. °  °Document Released: 05/17/2011 Document Revised: 03/15/2014 Document Reviewed: 04/17/2013 °Elsevier Interactive Patient Education ©2016 [employer] Inc. ° °

## 2015-05-05 ENCOUNTER — Encounter: Payer: Self-pay | Admitting: Family Medicine

## 2015-05-05 ENCOUNTER — Ambulatory Visit (INDEPENDENT_AMBULATORY_CARE_PROVIDER_SITE_OTHER): Payer: PPO | Admitting: Family Medicine

## 2015-05-05 VITALS — BP 138/94 | HR 80 | Ht 67.0 in | Wt 157.0 lb

## 2015-05-05 DIAGNOSIS — R0609 Other forms of dyspnea: Secondary | ICD-10-CM

## 2015-05-05 DIAGNOSIS — E785 Hyperlipidemia, unspecified: Secondary | ICD-10-CM | POA: Diagnosis not present

## 2015-05-05 DIAGNOSIS — R079 Chest pain, unspecified: Secondary | ICD-10-CM

## 2015-05-05 DIAGNOSIS — D529 Folate deficiency anemia, unspecified: Secondary | ICD-10-CM

## 2015-05-05 DIAGNOSIS — I10 Essential (primary) hypertension: Secondary | ICD-10-CM

## 2015-05-05 DIAGNOSIS — H6123 Impacted cerumen, bilateral: Secondary | ICD-10-CM

## 2015-05-05 DIAGNOSIS — R0600 Dyspnea, unspecified: Secondary | ICD-10-CM

## 2015-05-05 MED ORDER — FOLIC ACID 800 MCG PO TABS: 800.0000 ug | ORAL_TABLET | Freq: Every day | ORAL | Status: DC

## 2015-05-05 MED ORDER — PRAVASTATIN SODIUM 40 MG PO TABS: 40.0000 mg | ORAL_TABLET | Freq: Every day | ORAL | Status: DC

## 2015-05-05 MED ORDER — METOPROLOL SUCCINATE ER 50 MG PO TB24: 50.0000 mg | ORAL_TABLET | Freq: Every day | ORAL | Status: DC

## 2015-05-05 MED ORDER — CARBAMIDE PEROXIDE 6.5 % OT SOLN: 5.0000 [drp] | Freq: Two times a day (BID) | OTIC | Status: DC

## 2015-05-05 MED ORDER — LOSARTAN POTASSIUM 100 MG PO TABS: 100.0000 mg | ORAL_TABLET | Freq: Every day | ORAL | Status: DC

## 2015-05-05 NOTE — Progress Notes (Signed)
Name: Taylor Griffin   MRN: [id number]    DOB: 09/03/1950   Date:05/05/2015       Progress Note  Subjective  Chief Complaint  Chief Complaint  Patient presents with  . Hypertension  . Hyperlipidemia  . Anemia    takes folic acid RX  . Shortness of Breath    gets SOB when pushing vacuum cleaner and has a hurting on R) side of neck    Hypertension This is a chronic problem. The current episode started more than 1 year ago. The problem has been waxing and waning since onset. The problem is controlled. Associated symptoms include chest pain and shortness of breath. Pertinent negatives include no anxiety, blurred vision, headaches, malaise/fatigue, neck pain, orthopnea, palpitations, peripheral edema, PND or sweats. (DOE) There are no associated agents to hypertension. There are no known risk factors for coronary artery disease. Past treatments include angiotensin blockers and beta blockers. The current treatment provides mild improvement. There are no compliance problems.  Hypertensive end-organ damage includes angina. There is no history of kidney disease, CAD/MI, CVA, heart failure, left ventricular hypertrophy, PVD, renovascular disease or retinopathy. There is no history of chronic renal disease or a hypertension causing med.  Hyperlipidemia This is a chronic problem. The current episode started more than 1 year ago. Recent lipid tests were reviewed and are normal. She has no history of chronic renal disease, diabetes, hypothyroidism, liver disease, obesity or nephrotic syndrome. There are no known factors aggravating her hyperlipidemia. Associated symptoms include chest pain and shortness of breath. Pertinent negatives include no focal sensory loss, focal weakness, leg pain or myalgias. The current treatment provides moderate improvement of lipids. There are no compliance problems.  Risk factors for coronary artery disease include dyslipidemia.  Anemia Presents for follow-up visit.  There has been no abdominal pain, anorexia, bruising/bleeding easily, confusion, fever, leg swelling, light-headedness, malaise/fatigue, pallor, palpitations, paresthesias, pica or weight loss. Signs of blood loss that are not present include melena. Past treatments include folic acid. There is no history of chronic renal disease, heart failure or hypothyroidism.  Shortness of Breath This is a recurrent problem. The current episode started 1 to 4 weeks ago. The problem occurs every few minutes. The problem has been gradually worsening. Associated symptoms include chest pain. Pertinent negatives include no abdominal pain, ear pain, fever, headaches, leg pain, leg swelling, neck pain, orthopnea, PND, rash, sore throat, sputum production or wheezing. The symptoms are aggravated by any activity and exercise. Associated symptoms comments: diaphoretic. There is no history of a heart failure.  Chest Pain  This is a new problem. The current episode started 1 to 4 weeks ago. The onset quality is sudden. The problem occurs 2 to 4 times per day. The problem has been waxing and waning. The pain is present in the substernal region. The pain is moderate. The quality of the pain is described as pressure ("something not right"). The pain radiates to the right jaw. Associated symptoms include shortness of breath. Pertinent negatives include no abdominal pain, back pain, cough, dizziness, fever, headaches, leg pain, malaise/fatigue, nausea, orthopnea, palpitations, PND or sputum production.  Her past medical history is significant for hyperlipidemia and hypertension.  Pertinent negatives for past medical history include no diabetes and no PVD.    No problem-specific assessment & plan notes found for this encounter.   Past Medical History  Diagnosis Date  . Hyperlipidemia   . Arthritis     WRIST AND FINGERS  . Hypertension  CONTROLLED WITH MEDS  . Anemia     Past Surgical History  Procedure Laterality Date   . Ectopic pregnancy surgery    . Colonoscopy with propofol N/A 11/22/2014    Procedure: COLONOSCOPY WITH PROPOFOL;  Surgeon: Lucilla Lame, MD;  Location: [place];  Service: Endoscopy;  Laterality: N/A;  LATEX ALLERGY    Family History  Problem Relation Age of Onset  . Diabetes Mother   . Hypertension Mother     Social History   Social History  . Marital Status: Married    Spouse Name: N/A  . Number of Children: N/A  . Years of Education: N/A   Occupational History  . Not on file.   Social History Main Topics  . Smoking status: Former Smoker -- 0.50 packs/day for 18 years    Types: Cigarettes  . Smokeless tobacco: Not on file  . Alcohol Use: No  . Drug Use: No  . Sexual Activity: No   Other Topics Concern  . Not on file   Social History Narrative    Allergies  Allergen Reactions  . Latex Itching     Review of Systems  Constitutional: Negative for fever, chills, weight loss and malaise/fatigue.  HENT: Negative for ear discharge, ear pain and sore throat.   Eyes: Negative for blurred vision.  Respiratory: Positive for shortness of breath. Negative for cough, sputum production and wheezing.   Cardiovascular: Positive for chest pain. Negative for palpitations, orthopnea, leg swelling and PND.  Gastrointestinal: Negative for heartburn, nausea, abdominal pain, diarrhea, constipation, blood in stool, melena and anorexia.  Genitourinary: Negative for dysuria, urgency, frequency and hematuria.  Musculoskeletal: Negative for myalgias, back pain, joint pain and neck pain.  Skin: Negative for pallor and rash.  Neurological: Negative for dizziness, tingling, sensory change, focal weakness, light-headedness, headaches and paresthesias.  Endo/Heme/Allergies: Negative for environmental allergies and polydipsia. Does not bruise/bleed easily.  Psychiatric/Behavioral: Negative for depression, suicidal ideas and confusion. The patient is not nervous/anxious and does not  have insomnia.      Objective  Filed Vitals:   05/05/15 0908 05/05/15 1004  BP: 138/92 138/94  Pulse: 80   Height: 5\' 7"  (1.702 m)   Weight: 157 lb (71.215 kg)     Physical Exam  Constitutional: She is well-developed, well-nourished, and in no distress. No distress.  HENT:  Head: Normocephalic and atraumatic.  Right Ear: External ear normal. Decreased hearing is noted.  Left Ear: External ear normal. Decreased hearing is noted.  Nose: Nose normal.  Mouth/Throat: Oropharynx is clear and moist.  Cerumen impaction  Eyes: Conjunctivae and EOM are normal. Pupils are equal, round, and reactive to light. Right eye exhibits no discharge. Left eye exhibits no discharge.  Neck: Normal range of motion. Neck supple. No JVD present. No thyromegaly present.  Cardiovascular: Normal rate, regular rhythm, normal heart sounds and intact distal pulses.  Exam reveals no gallop and no friction rub.   No murmur heard. Pulmonary/Chest: Effort normal and breath sounds normal. No respiratory distress. She has no wheezes. She has no rales. She exhibits no tenderness.  Abdominal: Soft. Bowel sounds are normal. She exhibits no mass. There is no tenderness. There is no guarding.  Musculoskeletal: Normal range of motion. She exhibits no edema.  Lymphadenopathy:    She has no cervical adenopathy.  Neurological: She is alert.  Skin: Skin is warm and dry. She is not diaphoretic.  Psychiatric: Mood and affect normal.  Nursing note and vitals reviewed.     Assessment &  Plan  Problem List Items Addressed This Visit    None    Visit Diagnoses    Chest pain, unspecified chest pain type    -  Primary    Relevant Orders    EKG 12-Lead (Completed)    Ambulatory referral to Cardiology    DG Chest 2 View    Dyspnea on exertion        Relevant Orders    EKG 12-Lead (Completed)    Ambulatory referral to Cardiology    DG Chest 2 View    Essential hypertension        Relevant Medications    losartan  (COZAAR) 100 MG tablet    metoprolol succinate (TOPROL-XL) 50 MG 24 hr tablet    pravastatin (PRAVACHOL) 40 MG tablet    Other Relevant Orders    Renal Function Panel    Hyperlipidemia        Relevant Medications    losartan (COZAAR) 100 MG tablet    metoprolol succinate (TOPROL-XL) 50 MG 24 hr tablet    pravastatin (PRAVACHOL) 40 MG tablet    Other Relevant Orders    Lipid Profile    Anemia due to folic acid deficiency        Relevant Medications    folic acid (FOLVITE) [id number] MCG tablet    Cerumen impaction, bilateral        Relevant Medications    carbamide peroxide (DEBROX) 6.5 % otic solution         Dr. Deanna Jones [facility]  05/05/2015

## 2015-05-06 LAB — RENAL FUNCTION PANEL
Albumin: 4.3 g/dL (ref 3.6–4.8)
BUN/Creatinine Ratio: 14 (ref 11–26)
BUN: 15 mg/dL (ref 8–27)
CO2: 21 mmol/L (ref 18–29)
CREATININE: 1.1 mg/dL — AB (ref 0.57–1.00)
Calcium: 9.6 mg/dL (ref 8.7–10.3)
Chloride: 104 mmol/L (ref 96–106)
GFR calc Af Amer: 61 mL/min/{1.73_m2} (ref >59)
GFR, EST NON AFRICAN AMERICAN: 53 mL/min/{1.73_m2} — AB (ref >59)
GLUCOSE: 75 mg/dL (ref 65–99)
Phosphorus: 3.7 mg/dL (ref 2.5–4.5)
Potassium: 4.8 mmol/L (ref 3.5–5.2)
SODIUM: 145 mmol/L — AB (ref 134–144)

## 2015-05-06 LAB — LIPID PANEL
CHOL/HDL RATIO: 4.1 ratio (ref 0.0–4.4)
Cholesterol, Total: 203 mg/dL — ABNORMAL HIGH (ref 100–199)
HDL: 49 mg/dL (ref >39)
LDL CALC: 125 mg/dL — AB (ref 0–99)
TRIGLYCERIDES: 146 mg/dL (ref 0–149)
VLDL Cholesterol Cal: 29 mg/dL (ref 5–40)

## 2015-05-19 ENCOUNTER — Encounter: Payer: Self-pay | Admitting: Family Medicine

## 2015-05-19 ENCOUNTER — Ambulatory Visit (INDEPENDENT_AMBULATORY_CARE_PROVIDER_SITE_OTHER): Payer: PPO | Admitting: Family Medicine

## 2015-05-19 VITALS — BP 138/86 | HR 72 | Ht 67.0 in | Wt 156.0 lb

## 2015-05-19 DIAGNOSIS — H6123 Impacted cerumen, bilateral: Secondary | ICD-10-CM | POA: Diagnosis not present

## 2015-05-19 NOTE — Progress Notes (Signed)
Name: Taylor Griffin   MRN: [id number]    DOB: 11/01/1950   Date:05/19/2015       Progress Note  Subjective  Chief Complaint  Chief Complaint  Patient presents with  . Cerumen Impaction    Foreign Body in Ear The incident occurred more than 1 week ago. Suspected object: cerumen bilateral/ Q tip. Pertinent negatives include no abdominal pain, chest pain, cough, fever, sore throat or wheezing.    No problem-specific assessment & plan notes found for this encounter.   Past Medical History  Diagnosis Date  . Hyperlipidemia   . Arthritis     WRIST AND FINGERS  . Hypertension     CONTROLLED WITH MEDS  . Anemia     Past Surgical History  Procedure Laterality Date  . Ectopic pregnancy surgery    . Colonoscopy with propofol N/A 11/22/2014    Procedure: COLONOSCOPY WITH PROPOFOL;  Surgeon: Lucilla Lame, MD;  Location: [place];  Service: Endoscopy;  Laterality: N/A;  LATEX ALLERGY    Family History  Problem Relation Age of Onset  . Diabetes Mother   . Hypertension Mother     Social History   Social History  . Marital Status: Married    Spouse Name: N/A  . Number of Children: N/A  . Years of Education: N/A   Occupational History  . Not on file.   Social History Main Topics  . Smoking status: Former Smoker -- 0.50 packs/day for 18 years    Types: Cigarettes  . Smokeless tobacco: Not on file  . Alcohol Use: No  . Drug Use: No  . Sexual Activity: No   Other Topics Concern  . Not on file   Social History Narrative    Allergies  Allergen Reactions  . Latex Itching     Review of Systems  Constitutional: Negative for fever, chills, weight loss and malaise/fatigue.  HENT: Negative for ear discharge, ear pain and sore throat.   Eyes: Negative for blurred vision.  Respiratory: Negative for cough, sputum production, shortness of breath and wheezing.   Cardiovascular: Negative for chest pain, palpitations and leg swelling.  Gastrointestinal:  Negative for heartburn, nausea, abdominal pain, diarrhea, constipation, blood in stool and melena.  Genitourinary: Negative for dysuria, urgency, frequency and hematuria.  Musculoskeletal: Negative for myalgias, back pain, joint pain and neck pain.  Skin: Negative for rash.  Neurological: Negative for dizziness, tingling, sensory change, focal weakness and headaches.  Endo/Heme/Allergies: Negative for environmental allergies and polydipsia. Does not bruise/bleed easily.  Psychiatric/Behavioral: Negative for depression and suicidal ideas. The patient is not nervous/anxious and does not have insomnia.      Objective  Filed Vitals:   05/19/15 1016  BP: 138/86  Pulse: 72  Height: 5\' 7"  (1.702 m)  Weight: 156 lb (70.761 kg)    Physical Exam  Constitutional: She is well-developed, well-nourished, and in no distress.  HENT:  Right Ear: Tympanic membrane and external ear normal. A foreign body is present. Decreased hearing is noted.  Left Ear: Tympanic membrane and external ear normal. A foreign body is present. Decreased hearing is noted.  Nursing note and vitals reviewed.     Assessment & Plan  Problem List Items Addressed This Visit    None    irrigated ears Return to clinic as needed   Dr. Otilio Miu [facility]  05/19/2015

## 2015-05-27 ENCOUNTER — Encounter: Payer: Self-pay | Admitting: Family Medicine

## 2015-05-27 ENCOUNTER — Ambulatory Visit (INDEPENDENT_AMBULATORY_CARE_PROVIDER_SITE_OTHER): Payer: PPO | Admitting: Family Medicine

## 2015-05-27 VITALS — BP 138/80 | HR 76 | Ht 67.0 in | Wt 156.0 lb

## 2015-05-27 DIAGNOSIS — I10 Essential (primary) hypertension: Secondary | ICD-10-CM

## 2015-05-27 MED ORDER — HYDROCHLOROTHIAZIDE 12.5 MG PO TABS: 12.5000 mg | ORAL_TABLET | Freq: Every day | ORAL | Status: DC

## 2015-05-27 NOTE — Progress Notes (Signed)
Name: Taylor Griffin   MRN: [id number]    DOB: 06/05/1950   Date:05/27/2015       Progress Note  Subjective  Chief Complaint  Chief Complaint  Patient presents with  . Hypertension    checked B/P- up and down- told to stay on med and come in for B/P check    Hypertension This is a chronic problem. The current episode started more than 1 year ago. The problem has been gradually improving since onset. The problem is controlled. Pertinent negatives include no anxiety, blurred vision, chest pain, headaches, malaise/fatigue, neck pain, orthopnea, palpitations, peripheral edema, PND, shortness of breath or sweats. There are no associated agents to hypertension. There are no known risk factors for coronary artery disease. Past treatments include angiotensin blockers and diuretics. The current treatment provides no improvement. There are no compliance problems.  There is no history of angina, kidney disease, CAD/MI, CVA, heart failure, left ventricular hypertrophy, PVD, renovascular disease or retinopathy. There is no history of chronic renal disease or a hypertension causing med.    No problem-specific assessment & plan notes found for this encounter.   Past Medical History  Diagnosis Date  . Hyperlipidemia   . Arthritis     WRIST AND FINGERS  . Hypertension     CONTROLLED WITH MEDS  . Anemia     Past Surgical History  Procedure Laterality Date  . Ectopic pregnancy surgery    . Colonoscopy with propofol N/A 11/22/2014    Procedure: COLONOSCOPY WITH PROPOFOL;  Surgeon: Lucilla Lame, MD;  Location: [place];  Service: Endoscopy;  Laterality: N/A;  LATEX ALLERGY    Family History  Problem Relation Age of Onset  . Diabetes Mother   . Hypertension Mother     Social History   Social History  . Marital Status: Married    Spouse Name: N/A  . Number of Children: N/A  . Years of Education: N/A   Occupational History  . Not on file.   Social History Main Topics  .  Smoking status: Former Smoker -- 0.50 packs/day for 18 years    Types: Cigarettes  . Smokeless tobacco: Not on file  . Alcohol Use: No  . Drug Use: No  . Sexual Activity: No   Other Topics Concern  . Not on file   Social History Narrative    Allergies  Allergen Reactions  . Latex Itching     Review of Systems  Constitutional: Negative for fever, chills, weight loss and malaise/fatigue.  HENT: Negative for ear discharge, ear pain and sore throat.   Eyes: Negative for blurred vision.  Respiratory: Negative for cough, sputum production, shortness of breath and wheezing.   Cardiovascular: Negative for chest pain, palpitations, orthopnea, leg swelling and PND.  Gastrointestinal: Negative for heartburn, nausea, abdominal pain, diarrhea, constipation, blood in stool and melena.  Genitourinary: Negative for dysuria, urgency, frequency and hematuria.  Musculoskeletal: Negative for myalgias, back pain, joint pain and neck pain.  Skin: Negative for rash.  Neurological: Negative for dizziness, tingling, sensory change, focal weakness and headaches.  Endo/Heme/Allergies: Negative for environmental allergies and polydipsia. Does not bruise/bleed easily.  Psychiatric/Behavioral: Negative for depression and suicidal ideas. The patient is not nervous/anxious and does not have insomnia.      Objective  Filed Vitals:   05/27/15 1050  BP: 138/80  Pulse: 76  Height: 5\' 7"  (1.702 m)  Weight: 156 lb (70.761 kg)    Physical Exam  Constitutional: She is well-developed, well-nourished, and in  no distress. No distress.  HENT:  Head: Normocephalic and atraumatic.  Right Ear: External ear normal.  Left Ear: External ear normal.  Nose: Nose normal.  Mouth/Throat: Oropharynx is clear and moist.  Eyes: Conjunctivae and EOM are normal. Pupils are equal, round, and reactive to light. Right eye exhibits no discharge. Left eye exhibits no discharge.  Neck: Normal range of motion. Neck supple. No  JVD present. No thyromegaly present.  Cardiovascular: Normal rate, regular rhythm, normal heart sounds and intact distal pulses.  Exam reveals no gallop and no friction rub.   No murmur heard. Pulmonary/Chest: Effort normal and breath sounds normal.  Abdominal: Soft. Bowel sounds are normal. She exhibits no mass. There is no tenderness. There is no guarding.  Musculoskeletal: Normal range of motion. She exhibits no edema.  Lymphadenopathy:    She has no cervical adenopathy.  Neurological: She is alert. She has normal reflexes.  Skin: Skin is warm and dry. She is not diaphoretic.  Psychiatric: Mood and affect normal.  Nursing note and vitals reviewed.     Assessment & Plan  Problem List Items Addressed This Visit    None    Visit Diagnoses    Essential hypertension    -  Primary    continue losartin 100mg     Relevant Medications    hydrochlorothiazide (HYDRODIURIL) 12.5 MG tablet         Dr. Deanna Jones [facility]  05/27/2015

## 2015-05-27 NOTE — Patient Instructions (Signed)
How to Take Your Blood Pressure °HOW DO I GET A BLOOD PRESSURE MACHINE? °· You can buy an electronic home blood pressure machine at your local pharmacy. Insurance will sometimes cover the cost if you have a prescription. °· Ask your doctor what type of machine is best for you. There are different machines for your arm and your wrist. °· If you decide to buy a machine to check your blood pressure on your arm, first check the size of your arm so you can buy the right size cuff. To check the size of your arm:   °¨ Use a measuring tape that shows both inches and centimeters.   °¨ Wrap the measuring tape around the upper-middle part of your arm. You may need someone to help you measure.   °¨ Write down your arm measurement in both inches and centimeters.   °· To measure your blood pressure correctly, it is important to have the right size cuff.   °¨ If your arm is up to 13 inches (up to 34 centimeters), get an adult cuff size. °¨ If your arm is 13 to 17 inches (35 to 44 centimeters), get a large adult cuff size.   °¨  If your arm is 17 to 20 inches (45 to 52 centimeters), get an adult thigh cuff.   °WHAT DO THE NUMBERS MEAN?  °· There are two numbers that make up your blood pressure. For example: 120/80. °¨ The first number (120 in our example) is called the "systolic pressure." It is a measure of the pressure in your blood vessels when your heart is pumping blood. °¨ The second number (80 in our example) is called the "diastolic pressure." It is a measure of the pressure in your blood vessels when your heart is resting between beats. °· Your doctor will tell you what your blood pressure should be. °WHAT SHOULD I DO BEFORE I CHECK MY BLOOD PRESSURE?  °· Try to rest or relax for at least 30 minutes before you check your blood pressure. °· Do not smoke. °· Do not have any drinks with caffeine, such as: °¨ Soda. °¨ Coffee. °¨ Tea. °· Check your blood pressure in a quiet room. °· Sit down and stretch out your arm on a table.  Keep your arm at about the level of your heart. Let your arm relax. °· Make sure that your legs are not crossed. °HOW DO I CHECK MY BLOOD PRESSURE? °· Follow the directions that came with your machine. °· Make sure you remove any tight-fitting clothing from your arm or wrist. Wrap the cuff around your upper arm or wrist. You should be able to fit a finger between the cuff and your arm. If you cannot fit a finger between the cuff and your arm, it is too tight and should be removed and rewrapped. °· Some units require you to manually pump up the arm cuff. °· Automatic units inflate the cuff when you press a button. °· Cuff deflation is automatic in both models. °· After the cuff is inflated, the unit measures your blood pressure and pulse. The readings are shown on a monitor. Hold still and breathe normally while the cuff is inflated. °· Getting a reading takes less than a minute. °· Some models store readings in a memory. Some provide a printout of readings. If your machine does not store your readings, keep a written record. °· Take readings with you to your next visit with your doctor. °  °This information is not intended to replace advice given to   you by your health care provider. Make sure you discuss any questions you have with your health care provider. °  °Document Released: 02/05/2008 Document Revised: 03/15/2014 Document Reviewed: 04/19/2013 °Elsevier Interactive Patient Education ©2016 [employer] ° °

## 2015-06-23 ENCOUNTER — Encounter: Payer: Self-pay | Admitting: Family Medicine

## 2015-06-23 ENCOUNTER — Ambulatory Visit (INDEPENDENT_AMBULATORY_CARE_PROVIDER_SITE_OTHER): Payer: PPO | Admitting: Family Medicine

## 2015-06-23 VITALS — BP 110/72 | HR 72 | Ht 67.0 in | Wt 156.0 lb

## 2015-06-23 DIAGNOSIS — D529 Folate deficiency anemia, unspecified: Secondary | ICD-10-CM

## 2015-06-23 DIAGNOSIS — I10 Essential (primary) hypertension: Secondary | ICD-10-CM | POA: Diagnosis not present

## 2015-06-23 MED ORDER — HYDROCHLOROTHIAZIDE 12.5 MG PO TABS: 12.5000 mg | ORAL_TABLET | Freq: Every day | ORAL | Status: DC

## 2015-06-23 MED ORDER — FOLIC ACID 800 MCG PO TABS: 800.0000 ug | ORAL_TABLET | Freq: Every day | ORAL | Status: DC

## 2015-06-23 NOTE — Progress Notes (Signed)
Name: Taylor Griffin   MRN: [id number]    DOB: 10-19-1950   Date:06/23/2015       Progress Note  Subjective  Chief Complaint  Chief Complaint  Patient presents with  . Hypertension    recheck B/P- high at last visit    Hypertension This is a chronic problem. The current episode started more than 1 year ago. The problem has been waxing and waning since onset. The problem is controlled. Pertinent negatives include no anxiety, blurred vision, chest pain, headaches, malaise/fatigue, neck pain, orthopnea, palpitations, peripheral edema, PND, shortness of breath or sweats. There are no associated agents to hypertension. Risk factors for coronary artery disease include dyslipidemia. Past treatments include angiotensin blockers, calcium channel blockers and diuretics. The current treatment provides moderate improvement. There are no compliance problems.  There is no history of angina, kidney disease, CAD/MI, CVA, heart failure, left ventricular hypertrophy, PVD, renovascular disease or retinopathy. There is no history of chronic renal disease or a hypertension causing med.    No problem-specific assessment & plan notes found for this encounter.   Past Medical History  Diagnosis Date  . Hyperlipidemia   . Arthritis     WRIST AND FINGERS  . Hypertension     CONTROLLED WITH MEDS  . Anemia     Past Surgical History  Procedure Laterality Date  . Ectopic pregnancy surgery    . Colonoscopy with propofol N/A 11/22/2014    Procedure: COLONOSCOPY WITH PROPOFOL;  Surgeon: Lucilla Lame, MD;  Location: [facility];  Service: Endoscopy;  Laterality: N/A;  LATEX ALLERGY    Family History  Problem Relation Age of Onset  . Diabetes Mother   . Hypertension Mother     Social History   Social History  . Marital Status: Married    Spouse Name: N/A  . Number of Children: N/A  . Years of Education: N/A   Occupational History  . Not on file.   Social History Main Topics  . Smoking  status: Former Smoker -- 0.50 packs/day for 18 years    Types: Cigarettes  . Smokeless tobacco: Not on file  . Alcohol Use: No  . Drug Use: No  . Sexual Activity: No   Other Topics Concern  . Not on file   Social History Narrative    Allergies  Allergen Reactions  . Latex Itching     Review of Systems  Constitutional: Negative for fever, chills, weight loss and malaise/fatigue.  HENT: Negative for ear discharge, ear pain and sore throat.   Eyes: Negative for blurred vision.  Respiratory: Negative for cough, sputum production, shortness of breath and wheezing.   Cardiovascular: Negative for chest pain, palpitations, orthopnea, leg swelling and PND.  Gastrointestinal: Negative for heartburn, nausea, abdominal pain, diarrhea, constipation, blood in stool and melena.  Genitourinary: Negative for dysuria, urgency, frequency and hematuria.  Musculoskeletal: Negative for myalgias, back pain, joint pain and neck pain.  Skin: Negative for rash.  Neurological: Negative for dizziness, tingling, sensory change, focal weakness and headaches.  Endo/Heme/Allergies: Negative for environmental allergies and polydipsia. Does not bruise/bleed easily.  Psychiatric/Behavioral: Negative for depression and suicidal ideas. The patient is not nervous/anxious and does not have insomnia.      Objective  Filed Vitals:   06/23/15 1051  BP: 110/72  Pulse: 72  Height: 5\' 7"  (1.702 m)  Weight: 156 lb (70.761 kg)    Physical Exam  Constitutional: She is well-developed, well-nourished, and in no distress. No distress.  HENT:  Head:  Normocephalic and atraumatic.  Right Ear: External ear normal.  Left Ear: External ear normal.  Nose: Nose normal.  Mouth/Throat: Oropharynx is clear and moist.  Eyes: Conjunctivae and EOM are normal. Pupils are equal, round, and reactive to light. Right eye exhibits no discharge. Left eye exhibits no discharge.  Neck: Normal range of motion. Neck supple. No JVD  present. No thyromegaly present.  Cardiovascular: Normal rate, regular rhythm, normal heart sounds and intact distal pulses.  Exam reveals no gallop and no friction rub.   No murmur heard. Pulmonary/Chest: Effort normal and breath sounds normal.  Abdominal: Soft. Bowel sounds are normal. She exhibits no mass. There is no tenderness. There is no guarding.  Musculoskeletal: Normal range of motion. She exhibits no edema.  Lymphadenopathy:    She has no cervical adenopathy.  Neurological: She is alert.  Skin: Skin is warm and dry. She is not diaphoretic.  Psychiatric: Mood and affect normal.  Nursing note and vitals reviewed.     Assessment & Plan  Problem List Items Addressed This Visit    None    Visit Diagnoses    Essential hypertension    -  Primary    continue losartin 100mg     Relevant Medications    hydrochlorothiazide (HYDRODIURIL) 12.5 MG tablet    Anemia due to folic acid deficiency        Relevant Medications    folic acid (FOLVITE) Q000111Q MCG tablet         Dr. Moses Odoherty [facility]  06/23/2015

## 2015-11-03 ENCOUNTER — Encounter: Payer: Self-pay | Admitting: Family Medicine

## 2015-11-03 ENCOUNTER — Ambulatory Visit (INDEPENDENT_AMBULATORY_CARE_PROVIDER_SITE_OTHER): Payer: PPO | Admitting: Family Medicine

## 2015-11-03 VITALS — BP 120/68 | HR 68 | Ht 67.0 in | Wt 153.0 lb

## 2015-11-03 DIAGNOSIS — I10 Essential (primary) hypertension: Secondary | ICD-10-CM

## 2015-11-03 DIAGNOSIS — E785 Hyperlipidemia, unspecified: Secondary | ICD-10-CM | POA: Diagnosis not present

## 2015-11-03 DIAGNOSIS — D529 Folate deficiency anemia, unspecified: Secondary | ICD-10-CM | POA: Diagnosis not present

## 2015-11-03 MED ORDER — PRAVASTATIN SODIUM 40 MG PO TABS: 40.0000 mg | ORAL_TABLET | Freq: Every day | ORAL | 1 refills | Status: DC

## 2015-11-03 MED ORDER — LOSARTAN POTASSIUM 100 MG PO TABS: 100.0000 mg | ORAL_TABLET | Freq: Every day | ORAL | 1 refills | Status: DC

## 2015-11-03 MED ORDER — METOPROLOL SUCCINATE ER 50 MG PO TB24: 50.0000 mg | ORAL_TABLET | Freq: Every day | ORAL | 1 refills | Status: DC

## 2015-11-03 MED ORDER — FOLIC ACID 800 MCG PO TABS: 800.0000 ug | ORAL_TABLET | Freq: Every day | ORAL | 3 refills | Status: DC

## 2015-11-03 MED ORDER — HYDROCHLOROTHIAZIDE 12.5 MG PO TABS: 12.5000 mg | ORAL_TABLET | Freq: Every day | ORAL | 1 refills | Status: DC

## 2015-11-03 NOTE — Progress Notes (Signed)
Name: Taylor Griffin   MRN: [id number]    DOB: 1951/02/04   Date:11/03/2015       Progress Note  Subjective  Chief Complaint  Chief Complaint  Patient presents with  . Hypertension    wants 90 days Rx  . Hyperlipidemia    Hypertension  This is a chronic problem. The current episode started more than 1 year ago. The problem has been gradually improving since onset. The problem is controlled. Pertinent negatives include no anxiety, blurred vision, chest pain, headaches, malaise/fatigue, neck pain, orthopnea, palpitations, peripheral edema, PND, shortness of breath or sweats. There are no associated agents to hypertension. There are no known risk factors for coronary artery disease. Past treatments include angiotensin blockers, beta blockers and diuretics. The current treatment provides moderate improvement. There are no compliance problems.  There is no history of angina, kidney disease, CAD/MI, CVA, heart failure, left ventricular hypertrophy, PVD, renovascular disease or retinopathy. There is no history of chronic renal disease or a hypertension causing med.  Hyperlipidemia  This is a chronic problem. The current episode started more than 1 year ago. The problem is controlled. Recent lipid tests were reviewed and are normal. She has no history of chronic renal disease, diabetes, hypothyroidism, liver disease, obesity or nephrotic syndrome. There are no known factors aggravating her hyperlipidemia. Pertinent negatives include no chest pain, focal weakness, myalgias or shortness of breath. She is currently on no antihyperlipidemic treatment. The current treatment provides moderate improvement of lipids. There are no compliance problems.  Risk factors for coronary artery disease include dyslipidemia, hypertension and post-menopausal.    No problem-specific Assessment & Plan notes found for this encounter.   Past Medical History:  Diagnosis Date  . Anemia   . Arthritis    WRIST AND  FINGERS  . Hyperlipidemia   . Hypertension    CONTROLLED WITH MEDS    Past Surgical History:  Procedure Laterality Date  . COLONOSCOPY WITH PROPOFOL N/A 11/22/2014   Procedure: COLONOSCOPY WITH PROPOFOL;  Surgeon: Lucilla Lame, MD;  Location: [place];  Service: Endoscopy;  Laterality: N/A;  LATEX ALLERGY  . ECTOPIC PREGNANCY SURGERY      Family History  Problem Relation Age of Onset  . Diabetes Mother   . Hypertension Mother     Social History   Social History  . Marital status: Married    Spouse name: N/A  . Number of children: N/A  . Years of education: N/A   Occupational History  . Not on file.   Social History Main Topics  . Smoking status: Former Smoker    Packs/day: 0.50    Years: 18.00    Types: Cigarettes  . Smokeless tobacco: Never Used  . Alcohol use No  . Drug use: No  . Sexual activity: No   Other Topics Concern  . Not on file   Social History Narrative  . No narrative on file    Allergies  Allergen Reactions  . Latex Itching     Review of Systems  Constitutional: Negative for chills, fever, malaise/fatigue and weight loss.  HENT: Negative for ear discharge, ear pain and sore throat.   Eyes: Negative for blurred vision.  Respiratory: Negative for cough, sputum production, shortness of breath and wheezing.   Cardiovascular: Negative for chest pain, palpitations, orthopnea, leg swelling and PND.  Gastrointestinal: Negative for abdominal pain, blood in stool, constipation, diarrhea, heartburn, melena and nausea.  Genitourinary: Negative for dysuria, frequency, hematuria and urgency.  Musculoskeletal: Negative for back  pain, joint pain, myalgias and neck pain.  Skin: Negative for rash.  Neurological: Negative for dizziness, tingling, sensory change, focal weakness and headaches.  Endo/Heme/Allergies: Negative for environmental allergies and polydipsia. Does not bruise/bleed easily.  Psychiatric/Behavioral: Negative for depression and  suicidal ideas. The patient is not nervous/anxious and does not have insomnia.      Objective  Vitals:   11/03/15 1010  BP: 120/68  Pulse: 68  Weight: 153 lb (69.4 kg)  Height: 5\' 7"  (1.702 m)    Physical Exam  Constitutional: She is well-developed, well-nourished, and in no distress. No distress.  HENT:  Head: Normocephalic and atraumatic.  Right Ear: External ear normal.  Left Ear: External ear normal.  Nose: Nose normal.  Mouth/Throat: Oropharynx is clear and moist.  Eyes: Conjunctivae and EOM are normal. Pupils are equal, round, and reactive to light. Right eye exhibits no discharge. Left eye exhibits no discharge.  Neck: Normal range of motion. Neck supple. No JVD present. No thyromegaly present.  Cardiovascular: Normal rate, regular rhythm, normal heart sounds and intact distal pulses.  Exam reveals no gallop and no friction rub.   No murmur heard. Pulmonary/Chest: Effort normal and breath sounds normal.  Abdominal: Soft. Bowel sounds are normal. She exhibits no mass. There is no tenderness. There is no guarding.  Musculoskeletal: Normal range of motion. She exhibits no edema.  Lymphadenopathy:    She has no cervical adenopathy.  Neurological: She is alert. She has normal reflexes.  Skin: Skin is warm and dry. She is not diaphoretic.  Psychiatric: Mood and affect normal.  Nursing note and vitals reviewed.     Assessment & Plan  Problem List Items Addressed This Visit    None    Visit Diagnoses    Essential hypertension    -  Primary   Relevant Medications   hydrochlorothiazide (HYDRODIURIL) 12.5 MG tablet   losartan (COZAAR) 100 MG tablet   metoprolol succinate (TOPROL-XL) 50 MG 24 hr tablet   pravastatin (PRAVACHOL) 40 MG tablet   Other Relevant Orders   Renal function panel   Hyperlipidemia       Relevant Medications   hydrochlorothiazide (HYDRODIURIL) 12.5 MG tablet   losartan (COZAAR) 100 MG tablet   metoprolol succinate (TOPROL-XL) 50 MG 24 hr  tablet   pravastatin (PRAVACHOL) 40 MG tablet   Essential hypertension       continue losartin 100mg    Relevant Medications   hydrochlorothiazide (HYDRODIURIL) 12.5 MG tablet   losartan (COZAAR) 100 MG tablet   metoprolol succinate (TOPROL-XL) 50 MG 24 hr tablet   pravastatin (PRAVACHOL) 40 MG tablet   Other Relevant Orders   Renal function panel   Anemia due to folic acid deficiency       Relevant Medications   folic acid (FOLVITE) Q000111Q MCG tablet        Dr. Antavia Tandy [facility]  11/03/15

## 2015-11-04 LAB — RENAL FUNCTION PANEL
Albumin: 4.5 g/dL (ref 3.6–4.8)
BUN / CREAT RATIO: 14 (ref 12–28)
BUN: 18 mg/dL (ref 8–27)
CHLORIDE: 100 mmol/L (ref 96–106)
CO2: 25 mmol/L (ref 18–29)
Calcium: 9.5 mg/dL (ref 8.7–10.3)
Creatinine, Ser: 1.25 mg/dL — ABNORMAL HIGH (ref 0.57–1.00)
GFR calc non Af Amer: 45 mL/min/{1.73_m2} — ABNORMAL LOW (ref >59)
GFR, EST AFRICAN AMERICAN: 52 mL/min/{1.73_m2} — AB (ref >59)
GLUCOSE: 88 mg/dL (ref 65–99)
POTASSIUM: 4.2 mmol/L (ref 3.5–5.2)
Phosphorus: 3.7 mg/dL (ref 2.5–4.5)
SODIUM: 144 mmol/L (ref 134–144)

## 2015-12-16 ENCOUNTER — Other Ambulatory Visit: Payer: Self-pay | Admitting: Family Medicine

## 2015-12-16 DIAGNOSIS — D520 Dietary folate deficiency anemia: Secondary | ICD-10-CM

## 2016-02-11 ENCOUNTER — Other Ambulatory Visit: Payer: Self-pay | Admitting: Family Medicine

## 2016-02-11 DIAGNOSIS — D520 Dietary folate deficiency anemia: Secondary | ICD-10-CM

## 2016-04-08 ENCOUNTER — Other Ambulatory Visit: Payer: Self-pay | Admitting: Family Medicine

## 2016-04-08 DIAGNOSIS — D520 Dietary folate deficiency anemia: Secondary | ICD-10-CM

## 2016-04-12 ENCOUNTER — Ambulatory Visit (INDEPENDENT_AMBULATORY_CARE_PROVIDER_SITE_OTHER): Payer: Medicare HMO | Admitting: Family Medicine

## 2016-04-12 ENCOUNTER — Encounter: Payer: Self-pay | Admitting: Family Medicine

## 2016-04-12 VITALS — BP 102/80 | HR 64 | Temp 98.5°F | Ht 67.0 in | Wt 155.0 lb

## 2016-04-12 DIAGNOSIS — K5792 Diverticulitis of intestine, part unspecified, without perforation or abscess without bleeding: Secondary | ICD-10-CM

## 2016-04-12 DIAGNOSIS — J40 Bronchitis, not specified as acute or chronic: Secondary | ICD-10-CM

## 2016-04-12 DIAGNOSIS — D529 Folate deficiency anemia, unspecified: Secondary | ICD-10-CM | POA: Diagnosis not present

## 2016-04-12 DIAGNOSIS — J0100 Acute maxillary sinusitis, unspecified: Secondary | ICD-10-CM | POA: Diagnosis not present

## 2016-04-12 MED ORDER — METRONIDAZOLE 500 MG PO TABS: 500.0000 mg | ORAL_TABLET | Freq: Three times a day (TID) | ORAL | 0 refills | Status: DC

## 2016-04-12 MED ORDER — GUAIFENESIN-CODEINE 100-10 MG/5ML PO SYRP: 5.0000 mL | ORAL_SOLUTION | Freq: Three times a day (TID) | ORAL | 0 refills | Status: DC | PRN

## 2016-04-12 MED ORDER — AMOXICILLIN-POT CLAVULANATE 875-125 MG PO TABS: 1.0000 | ORAL_TABLET | Freq: Two times a day (BID) | ORAL | 0 refills | Status: DC

## 2016-04-12 MED ORDER — FOLIC ACID 800 MCG PO TABS: 800.0000 ug | ORAL_TABLET | Freq: Every day | ORAL | 1 refills | Status: DC

## 2016-04-12 NOTE — Patient Instructions (Signed)

## 2016-04-12 NOTE — Progress Notes (Signed)
Name: Taylor Griffin   MRN: [id number]    DOB: 1950/09/18   Date:04/12/2016       Progress Note  Subjective  Chief Complaint  Chief Complaint  Patient presents with  . Sinusitis    cough and cong- when coughing a pain comes in the side    Cough  This is a new problem. The current episode started in the past 7 days. The problem has been gradually worsening. The problem occurs every few minutes. The cough is productive of purulent sputum. Associated symptoms include a fever, rhinorrhea and a sore throat. Pertinent negatives include no chest pain, chills, ear congestion, ear pain, headaches, heartburn, hemoptysis, myalgias, nasal congestion, postnasal drip, rash, shortness of breath, weight loss or wheezing. The symptoms are aggravated by cold air. She has tried OTC cough suppressant for the symptoms. There is no history of environmental allergies.  Sinusitis  This is a chronic problem. The current episode started in the past 7 days. The problem has been gradually worsening since onset. Associated symptoms include congestion, coughing, sinus pressure and a sore throat. Pertinent negatives include no chills, ear pain, headaches, neck pain or shortness of breath.  Abdominal Pain  This is a new problem. The current episode started in the past 7 days (saturday onset). The onset quality is gradual. The problem occurs constantly. The problem has been gradually worsening. The pain is located in the LLQ. The pain is at a severity of 10/10. The pain is moderate. The abdominal pain radiates to the left flank and back. Associated symptoms include a fever. Pertinent negatives include no constipation, diarrhea, dysuria, frequency, headaches, hematochezia, hematuria, melena, myalgias, nausea, vomiting or weight loss. The pain is aggravated by coughing. The pain is relieved by recumbency.    No problem-specific Assessment & Plan notes found for this encounter.   Past Medical History:  Diagnosis Date  .  Anemia   . Arthritis    WRIST AND FINGERS  . Hyperlipidemia   . Hypertension    CONTROLLED WITH MEDS    Past Surgical History:  Procedure Laterality Date  . COLONOSCOPY WITH PROPOFOL N/A 11/22/2014   Procedure: COLONOSCOPY WITH PROPOFOL;  Surgeon: Lucilla Lame, MD;  Location: [facility];  Service: Endoscopy;  Laterality: N/A;  LATEX ALLERGY  . ECTOPIC PREGNANCY SURGERY      Family History  Problem Relation Age of Onset  . Diabetes Mother   . Hypertension Mother     Social History   Social History  . Marital status: Married    Spouse name: N/A  . Number of children: N/A  . Years of education: N/A   Occupational History  . Not on file.   Social History Main Topics  . Smoking status: Former Smoker    Packs/day: 0.50    Years: 18.00    Types: Cigarettes  . Smokeless tobacco: Never Used  . Alcohol use No  . Drug use: No  . Sexual activity: No   Other Topics Concern  . Not on file   Social History Narrative  . No narrative on file    Allergies  Allergen Reactions  . Latex Itching     Review of Systems  Constitutional: Positive for fever. Negative for chills, malaise/fatigue and weight loss.  HENT: Positive for congestion, rhinorrhea, sinus pressure and sore throat. Negative for ear discharge, ear pain and postnasal drip.   Eyes: Negative for blurred vision.  Respiratory: Positive for cough. Negative for hemoptysis, sputum production, shortness of breath and wheezing.  Cardiovascular: Negative for chest pain, palpitations and leg swelling.  Gastrointestinal: Positive for abdominal pain. Negative for blood in stool, constipation, diarrhea, heartburn, hematochezia, melena, nausea and vomiting.  Genitourinary: Negative for dysuria, frequency, hematuria and urgency.  Musculoskeletal: Negative for back pain, joint pain, myalgias and neck pain.  Skin: Negative for rash.  Neurological: Negative for dizziness, tingling, sensory change, focal weakness and  headaches.  Endo/Heme/Allergies: Negative for environmental allergies and polydipsia. Does not bruise/bleed easily.  Psychiatric/Behavioral: Negative for depression and suicidal ideas. The patient is not nervous/anxious and does not have insomnia.      Objective  Vitals:   04/12/16 1029  BP: 102/80  Pulse: 64  Temp: 98.5 F (36.9 C)  TempSrc: Oral  Weight: 155 lb (70.3 kg)  Height: 5\' 7"  (1.702 m)    Physical Exam  Constitutional: She is well-developed, well-nourished, and in no distress. No distress.  HENT:  Head: Normocephalic and atraumatic.  Right Ear: External ear normal.  Left Ear: External ear normal.  Nose: Nose normal.  Mouth/Throat: Oropharynx is clear and moist.  Eyes: Conjunctivae and EOM are normal. Pupils are equal, round, and reactive to light. Right eye exhibits no discharge. Left eye exhibits no discharge.  Neck: Normal range of motion. Neck supple. No JVD present. No thyromegaly present.  Cardiovascular: Normal rate, regular rhythm, normal heart sounds and intact distal pulses.  Exam reveals no gallop and no friction rub.   No murmur heard. Pulmonary/Chest: Effort normal and breath sounds normal. She has no wheezes. She has no rales.  Abdominal: Soft. Bowel sounds are normal. She exhibits no distension and no mass. There is tenderness. There is no rebound and no guarding.  Musculoskeletal: Normal range of motion. She exhibits no edema.  Lymphadenopathy:    She has no cervical adenopathy.  Neurological: She is alert. She has normal reflexes.  Skin: Skin is warm and dry. She is not diaphoretic.  Psychiatric: Mood and affect normal.  Nursing note and vitals reviewed.     Assessment & Plan  Problem List Items Addressed This Visit    None    Visit Diagnoses    Acute maxillary sinusitis, recurrence not specified    -  Primary   Relevant Medications   amoxicillin-clavulanate (AUGMENTIN) 875-125 MG tablet   metroNIDAZOLE (FLAGYL) 500 MG tablet    guaiFENesin-codeine (ROBITUSSIN AC) 100-10 MG/5ML syrup   Anemia due to folic acid deficiency, unspecified deficiency type       Relevant Medications   folic acid (FOLVITE) Q000111Q MCG tablet   Bronchitis       Relevant Medications   amoxicillin-clavulanate (AUGMENTIN) 875-125 MG tablet   guaiFENesin-codeine (ROBITUSSIN AC) 100-10 MG/5ML syrup   Diverticulitis of intestine without perforation or abscess without bleeding, unspecified part of intestinal tract       Relevant Medications   amoxicillin-clavulanate (AUGMENTIN) 875-125 MG tablet   metroNIDAZOLE (FLAGYL) 500 MG tablet   Other Relevant Orders   CBC with Differential/Platelet        Dr. Deanna Jones [facility]  04/12/16

## 2016-04-13 LAB — CBC WITH DIFFERENTIAL/PLATELET
Basophils Absolute: 0 10*3/uL (ref 0.0–0.2)
Basos: 0 %
EOS (ABSOLUTE): 0 10*3/uL (ref 0.0–0.4)
EOS: 0 %
HEMOGLOBIN: 14.1 g/dL (ref 11.1–15.9)
Hematocrit: 42.6 % (ref 34.0–46.6)
Immature Grans (Abs): 0 10*3/uL (ref 0.0–0.1)
Immature Granulocytes: 0 %
Lymphocytes Absolute: 1.4 10*3/uL (ref 0.7–3.1)
Lymphs: 56 %
MCH: 29.9 pg (ref 26.6–33.0)
MCHC: 33.1 g/dL (ref 31.5–35.7)
MCV: 90 fL (ref 79–97)
MONOCYTES: 18 %
MONOS ABS: 0.4 10*3/uL (ref 0.1–0.9)
NEUTROS PCT: 26 %
Neutrophils Absolute: 0.6 10*3/uL — ABNORMAL LOW (ref 1.4–7.0)
Platelets: 238 10*3/uL (ref 150–379)
RBC: 4.72 x10E6/uL (ref 3.77–5.28)
RDW: 14.3 % (ref 12.3–15.4)
WBC: 2.4 10*3/uL — CL (ref 3.4–10.8)

## 2016-04-14 ENCOUNTER — Other Ambulatory Visit: Payer: Self-pay | Admitting: Family Medicine

## 2016-04-14 DIAGNOSIS — D72819 Decreased white blood cell count, unspecified: Secondary | ICD-10-CM

## 2016-04-15 LAB — CBC WITH DIFFERENTIAL/PLATELET
Basophils Absolute: 0.1 10*3/uL (ref 0.0–0.2)
Basos: 2 %
EOS (ABSOLUTE): 0 10*3/uL (ref 0.0–0.4)
EOS: 1 %
Hematocrit: 40.7 % (ref 34.0–46.6)
Hemoglobin: 13.6 g/dL (ref 11.1–15.9)
IMMATURE GRANS (ABS): 0 10*3/uL (ref 0.0–0.1)
IMMATURE GRANULOCYTES: 0 %
LYMPHS: 46 %
Lymphocytes Absolute: 1.8 10*3/uL (ref 0.7–3.1)
MCH: 30.5 pg (ref 26.6–33.0)
MCHC: 33.4 g/dL (ref 31.5–35.7)
MCV: 91 fL (ref 79–97)
MONOCYTES: 18 %
Monocytes Absolute: 0.7 10*3/uL (ref 0.1–0.9)
NRBC: 1 % — ABNORMAL HIGH (ref 0–0)
Neutrophils Absolute: 1.3 10*3/uL — ABNORMAL LOW (ref 1.4–7.0)
Neutrophils: 33 %
Platelets: 241 10*3/uL (ref 150–379)
RBC: 4.46 x10E6/uL (ref 3.77–5.28)
RDW: 13.9 % (ref 12.3–15.4)
WBC: 3.9 10*3/uL (ref 3.4–10.8)

## 2016-04-22 DIAGNOSIS — H52223 Regular astigmatism, bilateral: Secondary | ICD-10-CM | POA: Diagnosis not present

## 2016-04-22 DIAGNOSIS — H5203 Hypermetropia, bilateral: Secondary | ICD-10-CM | POA: Diagnosis not present

## 2016-04-22 DIAGNOSIS — H524 Presbyopia: Secondary | ICD-10-CM | POA: Diagnosis not present

## 2016-05-03 ENCOUNTER — Encounter: Payer: Self-pay | Admitting: Family Medicine

## 2016-05-03 ENCOUNTER — Ambulatory Visit (INDEPENDENT_AMBULATORY_CARE_PROVIDER_SITE_OTHER): Payer: Medicare HMO | Admitting: Family Medicine

## 2016-05-03 VITALS — BP 118/80 | HR 80 | Ht 67.0 in | Wt 151.0 lb

## 2016-05-03 DIAGNOSIS — Z23 Encounter for immunization: Secondary | ICD-10-CM | POA: Diagnosis not present

## 2016-05-03 DIAGNOSIS — Z1239 Encounter for other screening for malignant neoplasm of breast: Secondary | ICD-10-CM

## 2016-05-03 DIAGNOSIS — Z1231 Encounter for screening mammogram for malignant neoplasm of breast: Secondary | ICD-10-CM

## 2016-05-03 DIAGNOSIS — Z1159 Encounter for screening for other viral diseases: Secondary | ICD-10-CM | POA: Diagnosis not present

## 2016-05-03 DIAGNOSIS — E785 Hyperlipidemia, unspecified: Secondary | ICD-10-CM | POA: Insufficient documentation

## 2016-05-03 DIAGNOSIS — D529 Folate deficiency anemia, unspecified: Secondary | ICD-10-CM

## 2016-05-03 DIAGNOSIS — I10 Essential (primary) hypertension: Secondary | ICD-10-CM

## 2016-05-03 DIAGNOSIS — E782 Mixed hyperlipidemia: Secondary | ICD-10-CM

## 2016-05-03 HISTORY — DX: Folate deficiency anemia, unspecified: D52.9

## 2016-05-03 MED ORDER — ASPIRIN 81 MG PO TABS
81.0000 mg | ORAL_TABLET | Freq: Every day | ORAL | 11 refills | Status: AC
Start: 2016-05-03 — End: ?

## 2016-05-03 MED ORDER — LOSARTAN POTASSIUM 100 MG PO TABS: 100.0000 mg | ORAL_TABLET | Freq: Every day | ORAL | 1 refills | Status: DC

## 2016-05-03 MED ORDER — PRAVASTATIN SODIUM 40 MG PO TABS: 40.0000 mg | ORAL_TABLET | Freq: Every day | ORAL | 1 refills | Status: DC

## 2016-05-03 MED ORDER — HYDROCHLOROTHIAZIDE 12.5 MG PO TABS: 12.5000 mg | ORAL_TABLET | Freq: Every day | ORAL | 1 refills | Status: DC

## 2016-05-03 MED ORDER — METOPROLOL SUCCINATE ER 50 MG PO TB24: 50.0000 mg | ORAL_TABLET | Freq: Every day | ORAL | 1 refills | Status: DC

## 2016-05-03 MED ORDER — FOLIC ACID 800 MCG PO TABS: 800.0000 ug | ORAL_TABLET | Freq: Every day | ORAL | 3 refills | Status: DC

## 2016-05-03 NOTE — Progress Notes (Signed)
Name: Taylor Griffin   MRN: [id number]    DOB: [birth year]   Date:05/03/2016       Progress Note  Subjective  Chief Complaint  Chief Complaint  Patient presents with  . Anemia  . Hyperlipidemia  . Hypertension    Anemia  Presents for follow-up (folic acid anemia) visit. There has been no abdominal pain, anorexia, bruising/bleeding easily, confusion, fever, leg swelling, light-headedness, malaise/fatigue, pallor, palpitations, paresthesias, pica or weight loss. Signs of blood loss that are not present include hematemesis, hematochezia, melena, menorrhagia and vaginal bleeding. There is no history of chronic renal disease, heart failure or hypothyroidism. There are no compliance problems.   Hyperlipidemia  This is a chronic problem. The problem is controlled. Recent lipid tests were reviewed and are normal. She has no history of chronic renal disease, diabetes, hypothyroidism, liver disease, obesity or nephrotic syndrome. There are no known factors aggravating her hyperlipidemia. Pertinent negatives include no chest pain, focal sensory loss, focal weakness, leg pain, myalgias or shortness of breath. Current antihyperlipidemic treatment includes statins. The current treatment provides mild improvement of lipids. There are no compliance problems.  Risk factors for coronary artery disease include dyslipidemia and hypertension.  Hypertension  This is a chronic problem. The current episode started more than 1 year ago. The problem has been gradually improving since onset. The problem is controlled. Pertinent negatives include no anxiety, blurred vision, chest pain, headaches, malaise/fatigue, neck pain, orthopnea, palpitations, peripheral edema, PND, shortness of breath or sweats. There are no associated agents to hypertension. There are no known risk factors for coronary artery disease. Past treatments include angiotensin blockers, beta blockers and diuretics. The current treatment provides  moderate improvement. There are no compliance problems.  There is no history of angina, kidney disease, CVA, heart failure, PVD, renovascular disease or retinopathy. There is no history of chronic renal disease or a hypertension causing med.    No problem-specific Assessment & Plan notes found for this encounter.   Past Medical History:  Diagnosis Date  . Anemia   . Arthritis    WRIST AND FINGERS  . Hyperlipidemia   . Hypertension    CONTROLLED WITH MEDS    Past Surgical History:  Procedure Laterality Date  . COLONOSCOPY WITH PROPOFOL N/A 11/22/2014   Procedure: COLONOSCOPY WITH PROPOFOL;  Surgeon: Lucilla Lame, MD;  Location: [facility];  Service: Endoscopy;  Laterality: N/A;  LATEX ALLERGY  . ECTOPIC PREGNANCY SURGERY      Family History  Problem Relation Age of Onset  . Diabetes Mother   . Hypertension Mother     Social History   Social History  . Marital status: Married    Spouse name: N/A  . Number of children: N/A  . Years of education: N/A   Occupational History  . Not on file.   Social History Main Topics  . Smoking status: Former Smoker    Packs/day: 0.50    Years: 18.00    Types: Cigarettes  . Smokeless tobacco: Never Used  . Alcohol use No  . Drug use: No  . Sexual activity: No   Other Topics Concern  . Not on file   Social History Narrative  . No narrative on file    Allergies  Allergen Reactions  . Latex Itching    Outpatient Medications Prior to Visit  Medication Sig Dispense Refill  . aspirin 81 MG tablet Take 81 mg by mouth daily. AM    . folic acid (FOLVITE) Q000111Q MCG tablet Take 1  tablet (800 mcg total) by mouth daily. Pt needs to take 1 whole pill, not the half- please d/c the half tablet 90 tablet 1  . hydrochlorothiazide (HYDRODIURIL) 12.5 MG tablet Take 1 tablet (12.5 mg total) by mouth daily. 90 tablet 1  . losartan (COZAAR) 100 MG tablet Take 1 tablet (100 mg total) by mouth daily. 90 tablet 1  . metoprolol succinate  (TOPROL-XL) 50 MG 24 hr tablet Take 1 tablet (50 mg total) by mouth daily. Take with or immediately following a meal. 90 tablet 1  . pravastatin (PRAVACHOL) 40 MG tablet Take 1 tablet (40 mg total) by mouth daily. 90 tablet 1  . amoxicillin-clavulanate (AUGMENTIN) 875-125 MG tablet Take 1 tablet by mouth 2 (two) times daily. 20 tablet 0  . guaiFENesin-codeine (ROBITUSSIN AC) 100-10 MG/5ML syrup Take 5 mLs by mouth 3 (three) times daily as needed for cough. 150 mL 0  . metroNIDAZOLE (FLAGYL) 500 MG tablet Take 1 tablet (500 mg total) by mouth 3 (three) times daily. 21 tablet 0   No facility-administered medications prior to visit.     Review of Systems  Constitutional: Negative for chills, fever, malaise/fatigue and weight loss.  HENT: Negative for ear discharge, ear pain and sore throat.   Eyes: Negative for blurred vision.  Respiratory: Negative for cough, sputum production, shortness of breath and wheezing.   Cardiovascular: Negative for chest pain, palpitations, orthopnea, leg swelling and PND.  Gastrointestinal: Negative for abdominal pain, anorexia, blood in stool, constipation, diarrhea, heartburn, hematemesis, hematochezia, melena and nausea.  Genitourinary: Negative for dysuria, frequency, hematuria, menorrhagia, urgency and vaginal bleeding.  Musculoskeletal: Negative for back pain, joint pain, myalgias and neck pain.  Skin: Negative for pallor and rash.  Neurological: Negative for dizziness, tingling, sensory change, focal weakness, light-headedness, headaches and paresthesias.  Endo/Heme/Allergies: Negative for environmental allergies and polydipsia. Does not bruise/bleed easily.  Psychiatric/Behavioral: Negative for confusion, depression and suicidal ideas. The patient is not nervous/anxious and does not have insomnia.        No ahedonism     Objective  Vitals:   05/03/16 1013  BP: 118/80  Pulse: 80  Weight: 151 lb (68.5 kg)  Height: 5\' 7"  (1.702 m)    Physical Exam   Constitutional: She is well-developed, well-nourished, and in no distress. No distress.  HENT:  Head: Normocephalic and atraumatic.  Right Ear: Tympanic membrane, external ear and ear canal normal.  Left Ear: Tympanic membrane, external ear and ear canal normal.  Nose: Nose normal.  Mouth/Throat: Oropharynx is clear and moist.  Eyes: Conjunctivae and EOM are normal. Pupils are equal, round, and reactive to light. Right eye exhibits no discharge. Left eye exhibits no discharge.  Neck: Normal range of motion. Neck supple. No JVD present. No thyromegaly present.  Cardiovascular: Normal rate, regular rhythm, normal heart sounds and intact distal pulses.  Exam reveals no gallop and no friction rub.   No murmur heard. Pulmonary/Chest: Effort normal and breath sounds normal. She has no wheezes. She has no rales. Right breast exhibits no inverted nipple, no mass, no nipple discharge, no skin change and no tenderness. Left breast exhibits no inverted nipple, no mass, no nipple discharge, no skin change and no tenderness. Breasts are symmetrical.  Abdominal: Soft. Bowel sounds are normal. She exhibits no mass. There is no tenderness. There is no guarding.  Musculoskeletal: Normal range of motion. She exhibits no edema.  Lymphadenopathy:    She has no cervical adenopathy.  Neurological: She is alert. She has normal  reflexes.  Skin: Skin is warm and dry. She is not diaphoretic.  Psychiatric: Mood and affect normal.  Nursing note and vitals reviewed.     Assessment & Plan  Problem List Items Addressed This Visit      Cardiovascular and Mediastinum   Essential hypertension   Relevant Medications   aspirin 81 MG tablet   pravastatin (PRAVACHOL) 40 MG tablet   hydrochlorothiazide (HYDRODIURIL) 12.5 MG tablet   losartan (COZAAR) 100 MG tablet   metoprolol succinate (TOPROL-XL) 50 MG 24 hr tablet   Other Relevant Orders   Renal Function Panel     Other   Mixed hyperlipidemia   Relevant  Medications   aspirin 81 MG tablet   pravastatin (PRAVACHOL) 40 MG tablet   hydrochlorothiazide (HYDRODIURIL) 12.5 MG tablet   losartan (COZAAR) 100 MG tablet   metoprolol succinate (TOPROL-XL) 50 MG 24 hr tablet   Other Relevant Orders   Lipid Profile   Anemia due to folic acid deficiency   Relevant Medications   folic acid (FOLVITE) Q000111Q MCG tablet    Other Visit Diagnoses    Breast cancer screening    -  Primary   Relevant Orders   MM Digital Screening   Need for hepatitis C screening test       Relevant Orders   Hepatitis C antibody   Need for diphtheria-tetanus-pertussis (Tdap) vaccine       Need for vaccination with 13-polyvalent pneumococcal conjugate vaccine          Meds ordered this encounter  Medications  . aspirin 81 MG tablet    Sig: Take 1 tablet (81 mg total) by mouth daily. AM    Dispense:  30 tablet    Refill:  11  . pravastatin (PRAVACHOL) 40 MG tablet    Sig: Take 1 tablet (40 mg total) by mouth daily.    Dispense:  90 tablet    Refill:  1  . hydrochlorothiazide (HYDRODIURIL) 12.5 MG tablet    Sig: Take 1 tablet (12.5 mg total) by mouth daily.    Dispense:  90 tablet    Refill:  1  . losartan (COZAAR) 100 MG tablet    Sig: Take 1 tablet (100 mg total) by mouth daily.    Dispense:  90 tablet    Refill:  1  . metoprolol succinate (TOPROL-XL) 50 MG 24 hr tablet    Sig: Take 1 tablet (50 mg total) by mouth daily. Take with or immediately following a meal.    Dispense:  90 tablet    Refill:  1  . folic acid (FOLVITE) Q000111Q MCG tablet    Sig: Take 1 tablet (800 mcg total) by mouth daily. Pt needs to take 1 whole pill, not the half- please d/c the half tablet    Dispense:  90 tablet    Refill:  3      Dr. Otilio Miu [facility]  05/03/16

## 2016-05-04 LAB — RENAL FUNCTION PANEL
ALBUMIN: 4.2 g/dL (ref 3.6–4.8)
BUN/Creatinine Ratio: 13 (ref 12–28)
BUN: 20 mg/dL (ref 8–27)
CALCIUM: 9.2 mg/dL (ref 8.7–10.3)
CO2: 27 mmol/L (ref 18–29)
Chloride: 98 mmol/L (ref 96–106)
Creatinine, Ser: 1.52 mg/dL — ABNORMAL HIGH (ref 0.57–1.00)
GFR calc Af Amer: 41 mL/min/{1.73_m2} — ABNORMAL LOW (ref >59)
GFR, EST NON AFRICAN AMERICAN: 35 mL/min/{1.73_m2} — AB (ref >59)
GLUCOSE: 92 mg/dL (ref 65–99)
PHOSPHORUS: 4 mg/dL (ref 2.5–4.5)
POTASSIUM: 3.9 mmol/L (ref 3.5–5.2)
SODIUM: 141 mmol/L (ref 134–144)

## 2016-05-04 LAB — HEPATITIS C ANTIBODY: Hep C Virus Ab: <0.1 s/co ratio (ref 0.0–0.9)

## 2016-05-04 LAB — LIPID PANEL
CHOL/HDL RATIO: 6 ratio — AB (ref 0.0–4.4)
Cholesterol, Total: 199 mg/dL (ref 100–199)
HDL: 33 mg/dL — AB (ref >39)
LDL Calculated: 129 mg/dL — ABNORMAL HIGH (ref 0–99)
TRIGLYCERIDES: 184 mg/dL — AB (ref 0–149)
VLDL Cholesterol Cal: 37 mg/dL (ref 5–40)

## 2016-05-12 ENCOUNTER — Ambulatory Visit: Payer: PPO

## 2016-05-25 ENCOUNTER — Other Ambulatory Visit: Payer: Medicare HMO

## 2016-05-25 DIAGNOSIS — R7989 Other specified abnormal findings of blood chemistry: Secondary | ICD-10-CM

## 2016-05-26 LAB — CREATININE, SERUM
Creatinine, Ser: 1.12 mg/dL — ABNORMAL HIGH (ref 0.57–1.00)
GFR, EST AFRICAN AMERICAN: 59 mL/min/{1.73_m2} — AB (ref >59)
GFR, EST NON AFRICAN AMERICAN: 51 mL/min/{1.73_m2} — AB (ref >59)

## 2016-05-31 ENCOUNTER — Ambulatory Visit
Admission: RE | Admit: 2016-05-31 | Discharge: 2016-05-31 | Disposition: A | Discharge status: Home / Self Care | Payer: Medicare HMO | Source: Ambulatory Visit | Attending: Family Medicine | Admitting: Family Medicine

## 2016-05-31 DIAGNOSIS — Z1231 Encounter for screening mammogram for malignant neoplasm of breast: Secondary | ICD-10-CM | POA: Diagnosis not present

## 2016-05-31 DIAGNOSIS — Z1239 Encounter for other screening for malignant neoplasm of breast: Secondary | ICD-10-CM

## 2016-06-14 ENCOUNTER — Ambulatory Visit (INDEPENDENT_AMBULATORY_CARE_PROVIDER_SITE_OTHER): Payer: Medicare HMO | Admitting: Family Medicine

## 2016-06-14 VITALS — BP 132/84 | HR 64 | Temp 98.4°F | Ht 67.0 in | Wt 158.6 lb

## 2016-06-14 DIAGNOSIS — K5732 Diverticulitis of large intestine without perforation or abscess without bleeding: Secondary | ICD-10-CM

## 2016-06-14 DIAGNOSIS — R1031 Right lower quadrant pain: Secondary | ICD-10-CM | POA: Diagnosis not present

## 2016-06-14 LAB — POCT URINALYSIS DIPSTICK
Bilirubin, UA: NEGATIVE
Glucose, UA: NEGATIVE
Ketones, UA: NEGATIVE
Leukocytes, UA: NEGATIVE
Nitrite, UA: NEGATIVE
Protein, UA: NEGATIVE
Spec Grav, UA: 1.025
Urobilinogen, UA: NEGATIVE
pH, UA: 5

## 2016-06-14 LAB — HEMOCCULT GUIAC POC 1CARD (OFFICE): Fecal Occult Blood, POC: NEGATIVE

## 2016-06-14 MED ORDER — AMOXICILLIN-POT CLAVULANATE 875-125 MG PO TABS: 1.0000 | ORAL_TABLET | Freq: Two times a day (BID) | ORAL | 0 refills | Status: DC

## 2016-06-14 MED ORDER — METRONIDAZOLE 500 MG PO TABS
500.0000 mg | ORAL_TABLET | Freq: Two times a day (BID) | ORAL | 0 refills | Status: DC
Start: 2016-06-14 — End: 2016-11-01

## 2016-06-14 NOTE — Progress Notes (Signed)
Name: Taylor Griffin   MRN: [id number]    DOB: [birth year]   Date:06/14/2016       Progress Note  Subjective  Chief Complaint  Chief Complaint  Patient presents with  . Abdominal Pain    Pain starts from back to front. All on the right side. Fluctuates. Shooting pain. Pt still has her appendix.     Abdominal Pain  This is a new problem. The current episode started in the past 7 days (Friday). The onset quality is sudden. The problem occurs intermittently. The problem has been gradually worsening. The pain is located in the RLQ. The pain is at a severity of 8/10 (not hurting presently). The pain is moderate. The quality of the pain is sharp. Associated symptoms include flatus. Pertinent negatives include no anorexia, constipation, diarrhea, dysuria, fever, frequency, headaches, hematochezia, hematuria, melena, myalgias, nausea, vomiting or weight loss. The pain is aggravated by certain positions and movement. Treatments tried: aleve. The treatment provided no relief. There is no history of abdominal surgery, gallstones, GERD, PUD or ulcerative colitis.    No problem-specific Assessment & Plan notes found for this encounter.   Past Medical History:  Diagnosis Date  . Anemia   . Arthritis    WRIST AND FINGERS  . Hyperlipidemia   . Hypertension    CONTROLLED WITH MEDS    Past Surgical History:  Procedure Laterality Date  . COLONOSCOPY WITH PROPOFOL N/A 11/22/2014   Procedure: COLONOSCOPY WITH PROPOFOL;  Surgeon: Lucilla Lame, MD;  Location: [facility];  Service: Endoscopy;  Laterality: N/A;  LATEX ALLERGY  . ECTOPIC PREGNANCY SURGERY      Family History  Problem Relation Age of Onset  . Diabetes Mother   . Hypertension Mother   . Breast cancer Sister 59    Social History   Social History  . Marital status: Married    Spouse name: N/A  . Number of children: N/A  . Years of education: N/A   Occupational History  . Not on file.   Social History Main Topics   . Smoking status: Former Smoker    Packs/day: 0.50    Years: 18.00    Types: Cigarettes  . Smokeless tobacco: Never Used  . Alcohol use No  . Drug use: No  . Sexual activity: No   Other Topics Concern  . Not on file   Social History Narrative  . No narrative on file    Allergies  Allergen Reactions  . Latex Itching    Outpatient Medications Prior to Visit  Medication Sig Dispense Refill  . aspirin 81 MG tablet Take 1 tablet (81 mg total) by mouth daily. AM 30 tablet 11  . folic acid (FOLVITE) 086 MCG tablet Take 1 tablet (800 mcg total) by mouth daily. Pt needs to take 1 whole pill, not the half- please d/c the half tablet 90 tablet 3  . hydrochlorothiazide (HYDRODIURIL) 12.5 MG tablet Take 1 tablet (12.5 mg total) by mouth daily. 90 tablet 1  . losartan (COZAAR) 100 MG tablet Take 1 tablet (100 mg total) by mouth daily. 90 tablet 1  . metoprolol succinate (TOPROL-XL) 50 MG 24 hr tablet Take 1 tablet (50 mg total) by mouth daily. Take with or immediately following a meal. 90 tablet 1  . pravastatin (PRAVACHOL) 40 MG tablet Take 1 tablet (40 mg total) by mouth daily. 90 tablet 1   No facility-administered medications prior to visit.     Review of Systems  Constitutional: Negative for chills,  fever, malaise/fatigue and weight loss.  HENT: Negative for ear discharge, ear pain and sore throat.   Eyes: Negative for blurred vision.  Respiratory: Negative for cough, sputum production, shortness of breath and wheezing.   Cardiovascular: Negative for chest pain, palpitations and leg swelling.  Gastrointestinal: Positive for abdominal pain and flatus. Negative for anorexia, blood in stool, constipation, diarrhea, heartburn, hematochezia, melena, nausea and vomiting.  Genitourinary: Negative for dysuria, frequency, hematuria and urgency.  Musculoskeletal: Negative for back pain, joint pain, myalgias and neck pain.  Skin: Negative for rash.  Neurological: Negative for dizziness,  tingling, sensory change, focal weakness and headaches.  Endo/Heme/Allergies: Negative for environmental allergies and polydipsia. Does not bruise/bleed easily.  Psychiatric/Behavioral: Negative for depression and suicidal ideas. The patient is not nervous/anxious and does not have insomnia.      Objective  Vitals:   06/14/16 1045  BP: 132/84  Pulse: 64  Temp: 98.4 F (36.9 C)  SpO2: 98%  Weight: 158 lb 9.6 oz (71.9 kg)  Height: 5\' 7"  (1.702 m)    Physical Exam  Constitutional: She is well-developed, well-nourished, and in no distress. No distress.  HENT:  Head: Normocephalic and atraumatic.  Right Ear: External ear normal.  Left Ear: External ear normal.  Nose: Nose normal.  Mouth/Throat: Oropharynx is clear and moist.  Eyes: Conjunctivae and EOM are normal. Pupils are equal, round, and reactive to light. Right eye exhibits no discharge. Left eye exhibits no discharge.  Neck: Normal range of motion. Neck supple. No JVD present. No thyromegaly present.  Cardiovascular: Normal rate, regular rhythm, normal heart sounds and intact distal pulses.  Exam reveals no gallop and no friction rub.   No murmur heard. Pulmonary/Chest: Effort normal and breath sounds normal. She has no wheezes. She has no rales.  Abdominal: Soft. Normal aorta and bowel sounds are normal. She exhibits no mass. There is no hepatosplenomegaly. There is tenderness in the right lower quadrant. There is no rigidity, no rebound, no guarding and no CVA tenderness.  Genitourinary: Rectum normal. Rectal exam shows no mass, no tenderness and guaiac negative stool.  Musculoskeletal: Normal range of motion. She exhibits no edema.  Lymphadenopathy:    She has no cervical adenopathy.  Neurological: She is alert. She has normal reflexes.  Skin: Skin is warm and dry. She is not diaphoretic.  Psychiatric: Mood and affect normal.  Nursing note and vitals reviewed.     Assessment & Plan  Problem List Items Addressed  This Visit    None    Visit Diagnoses    Right lower quadrant abdominal pain    -  Primary   Relevant Medications   amoxicillin-clavulanate (AUGMENTIN) 875-125 MG tablet   metroNIDAZOLE (FLAGYL) 500 MG tablet   Other Relevant Orders   POCT urinalysis dipstick   POCT occult blood stool (Completed)   CBC w/Diff/Platelet   Diverticulitis of large intestine without perforation or abscess without bleeding       Relevant Medications   amoxicillin-clavulanate (AUGMENTIN) 875-125 MG tablet   metroNIDAZOLE (FLAGYL) 500 MG tablet   Other Relevant Orders   CBC w/Diff/Platelet      Meds ordered this encounter  Medications  . amoxicillin-clavulanate (AUGMENTIN) 875-125 MG tablet    Sig: Take 1 tablet by mouth 2 (two) times daily.    Dispense:  20 tablet    Refill:  0  . metroNIDAZOLE (FLAGYL) 500 MG tablet    Sig: Take 1 tablet (500 mg total) by mouth 2 (two) times daily.  Dispense:  14 tablet    Refill:  0      Dr. Otilio Miu [facility]  06/14/16

## 2016-06-14 NOTE — Patient Instructions (Signed)

## 2016-06-15 LAB — CBC WITH DIFFERENTIAL/PLATELET
Basophils Absolute: 0.1 x10E3/uL (ref 0.0–0.2)
Basos: 1 %
EOS (ABSOLUTE): 0.2 x10E3/uL (ref 0.0–0.4)
Eos: 5 %
Hematocrit: 38.8 % (ref 34.0–46.6)
Hemoglobin: 13.4 g/dL (ref 11.1–15.9)
Immature Grans (Abs): 0 x10E3/uL (ref 0.0–0.1)
Immature Granulocytes: 0 %
Lymphocytes Absolute: 2 x10E3/uL (ref 0.7–3.1)
Lymphs: 40 %
MCH: 31.1 pg (ref 26.6–33.0)
MCHC: 34.5 g/dL (ref 31.5–35.7)
MCV: 90 fL (ref 79–97)
Monocytes Absolute: 0.4 x10E3/uL (ref 0.1–0.9)
Monocytes: 7 %
Neutrophils Absolute: 2.4 x10E3/uL (ref 1.4–7.0)
Neutrophils: 47 %
Platelets: 301 x10E3/uL (ref 150–379)
RBC: 4.31 x10E6/uL (ref 3.77–5.28)
RDW: 14.6 % (ref 12.3–15.4)
WBC: 5.1 x10E3/uL (ref 3.4–10.8)

## 2016-07-07 ENCOUNTER — Other Ambulatory Visit: Payer: Self-pay

## 2016-11-01 ENCOUNTER — Ambulatory Visit (INDEPENDENT_AMBULATORY_CARE_PROVIDER_SITE_OTHER): Payer: Medicare HMO | Admitting: Family Medicine

## 2016-11-01 ENCOUNTER — Encounter: Payer: Self-pay | Admitting: Family Medicine

## 2016-11-01 VITALS — BP 108/74 | HR 62 | Temp 98.3°F | Resp 16 | Ht 67.0 in | Wt 156.0 lb

## 2016-11-01 DIAGNOSIS — R1084 Generalized abdominal pain: Secondary | ICD-10-CM

## 2016-11-01 DIAGNOSIS — I10 Essential (primary) hypertension: Secondary | ICD-10-CM

## 2016-11-01 DIAGNOSIS — Z23 Encounter for immunization: Secondary | ICD-10-CM

## 2016-11-01 DIAGNOSIS — D529 Folate deficiency anemia, unspecified: Secondary | ICD-10-CM | POA: Diagnosis not present

## 2016-11-01 DIAGNOSIS — E782 Mixed hyperlipidemia: Secondary | ICD-10-CM | POA: Diagnosis not present

## 2016-11-01 DIAGNOSIS — R1310 Dysphagia, unspecified: Secondary | ICD-10-CM | POA: Diagnosis not present

## 2016-11-01 DIAGNOSIS — R1319 Other dysphagia: Secondary | ICD-10-CM

## 2016-11-01 MED ORDER — LOSARTAN POTASSIUM 100 MG PO TABS: 100.0000 mg | ORAL_TABLET | Freq: Every day | ORAL | 1 refills | Status: DC

## 2016-11-01 MED ORDER — FOLIC ACID 800 MCG PO TABS: 800.0000 ug | ORAL_TABLET | Freq: Every day | ORAL | 3 refills | Status: DC

## 2016-11-01 MED ORDER — METOPROLOL SUCCINATE ER 50 MG PO TB24: 50.0000 mg | ORAL_TABLET | Freq: Every day | ORAL | 1 refills | Status: DC

## 2016-11-01 MED ORDER — HYDROCHLOROTHIAZIDE 12.5 MG PO TABS: 12.5000 mg | ORAL_TABLET | Freq: Every day | ORAL | 1 refills | Status: DC

## 2016-11-01 MED ORDER — PRAVASTATIN SODIUM 40 MG PO TABS: 40.0000 mg | ORAL_TABLET | Freq: Every day | ORAL | 1 refills | Status: DC

## 2016-11-01 NOTE — Progress Notes (Signed)
Name: Taylor Griffin   MRN: [id number]    DOB: [birth year]   Date:11/01/2016       Progress Note  Subjective  Chief Complaint  Chief Complaint  Patient presents with  . Follow-up    htn, anemia, hyperlidemia    Hypertension  This is a chronic problem. The current episode started more than 1 year ago. The problem is unchanged. The problem is controlled. Pertinent negatives include no anxiety, blurred vision, chest pain, headaches, malaise/fatigue, neck pain, orthopnea, palpitations, peripheral edema, PND, shortness of breath or sweats. There are no associated agents to hypertension. There are no known risk factors for coronary artery disease. Past treatments include angiotensin blockers, beta blockers and diuretics. The current treatment provides mild improvement. There are no compliance problems.  There is no history of angina, kidney disease, CAD/MI, CVA, heart failure, left ventricular hypertrophy, PVD or retinopathy. There is no history of chronic renal disease, a hypertension causing med or renovascular disease.  Hyperlipidemia  This is a chronic problem. The problem is controlled. Recent lipid tests were reviewed and are normal. She has no history of chronic renal disease. Pertinent negatives include no chest pain, focal weakness, myalgias or shortness of breath. Current antihyperlipidemic treatment includes statins. The current treatment provides moderate improvement of lipids. There are no compliance problems.  There are no known risk factors for coronary artery disease.  Anemia  Presents for follow-up visit. There has been no abdominal pain, anorexia, bruising/bleeding easily, fever, leg swelling, light-headedness, malaise/fatigue, palpitations, paresthesias or weight loss. Signs of blood loss that are not present include melena. There is no history of chronic renal disease or heart failure. There are no compliance problems.     No problem-specific Assessment & Plan notes found for  this encounter.   Past Medical History:  Diagnosis Date  . Anemia   . Arthritis    WRIST AND FINGERS  . Hyperlipidemia   . Hypertension    CONTROLLED WITH MEDS    Past Surgical History:  Procedure Laterality Date  . COLONOSCOPY WITH PROPOFOL N/A 11/22/2014   Procedure: COLONOSCOPY WITH PROPOFOL;  Surgeon: Lucilla Lame, MD;  Location: [facility];  Service: Endoscopy;  Laterality: N/A;  LATEX ALLERGY  . ECTOPIC PREGNANCY SURGERY      Family History  Problem Relation Age of Onset  . Diabetes Mother   . Hypertension Mother   . Breast cancer Sister 79    Social History   Social History  . Marital status: Married    Spouse name: N/A  . Number of children: N/A  . Years of education: N/A   Occupational History  . Not on file.   Social History Main Topics  . Smoking status: Former Smoker    Packs/day: 0.50    Years: 18.00    Types: Cigarettes  . Smokeless tobacco: Never Used  . Alcohol use No  . Drug use: No  . Sexual activity: No   Other Topics Concern  . Not on file   Social History Narrative  . No narrative on file    Allergies  Allergen Reactions  . Latex Itching    Outpatient Medications Prior to Visit  Medication Sig Dispense Refill  . aspirin 81 MG tablet Take 1 tablet (81 mg total) by mouth daily. AM 30 tablet 11  . Multiple Vitamins-Minerals (MULTIVITAMIN WITH MINERALS) tablet Take 1 tablet by mouth daily.    . folic acid (FOLVITE) 413 MCG tablet Take 1 tablet (800 mcg total) by mouth daily. Pt  needs to take 1 whole pill, not the half- please d/c the half tablet 90 tablet 3  . hydrochlorothiazide (HYDRODIURIL) 12.5 MG tablet Take 1 tablet (12.5 mg total) by mouth daily. 90 tablet 1  . losartan (COZAAR) 100 MG tablet Take 1 tablet (100 mg total) by mouth daily. 90 tablet 1  . metoprolol succinate (TOPROL-XL) 50 MG 24 hr tablet Take 1 tablet (50 mg total) by mouth daily. Take with or immediately following a meal. 90 tablet 1  . pravastatin  (PRAVACHOL) 40 MG tablet Take 1 tablet (40 mg total) by mouth daily. 90 tablet 1  . amoxicillin-clavulanate (AUGMENTIN) 875-125 MG tablet Take 1 tablet by mouth 2 (two) times daily. 20 tablet 0  . metroNIDAZOLE (FLAGYL) 500 MG tablet Take 1 tablet (500 mg total) by mouth 2 (two) times daily. 14 tablet 0   No facility-administered medications prior to visit.     Review of Systems  Constitutional: Negative for chills, fever, malaise/fatigue and weight loss.  HENT: Negative for ear discharge, ear pain and sore throat.   Eyes: Negative for blurred vision.  Respiratory: Negative for cough, sputum production, shortness of breath and wheezing.   Cardiovascular: Negative for chest pain, palpitations, orthopnea, leg swelling and PND.  Gastrointestinal: Negative for abdominal pain, anorexia, blood in stool, constipation, diarrhea, heartburn, melena and nausea.  Genitourinary: Negative for dysuria, frequency, hematuria and urgency.  Musculoskeletal: Negative for back pain, joint pain, myalgias and neck pain.  Skin: Negative for rash.  Neurological: Negative for dizziness, tingling, sensory change, focal weakness, light-headedness, headaches and paresthesias.  Endo/Heme/Allergies: Negative for environmental allergies and polydipsia. Does not bruise/bleed easily.  Psychiatric/Behavioral: Negative for depression and suicidal ideas. The patient is not nervous/anxious and does not have insomnia.      Objective  Vitals:   11/01/16 1015  BP: 108/74  Pulse: 62  Resp: 16  Temp: 98.3 F (36.8 C)  TempSrc: Oral  SpO2: 98%  Weight: 156 lb (70.8 kg)  Height: 5\' 7"  (1.702 m)    Physical Exam  Constitutional: She is well-developed, well-nourished, and in no distress. No distress.  HENT:  Head: Normocephalic and atraumatic.  Right Ear: External ear normal.  Left Ear: External ear normal.  Nose: Nose normal.  Mouth/Throat: Oropharynx is clear and moist.  Eyes: Pupils are equal, round, and reactive  to light. Conjunctivae and EOM are normal. Right eye exhibits no discharge. Left eye exhibits no discharge.  Neck: Normal range of motion. Neck supple. No JVD present. No thyromegaly present.  Cardiovascular: Normal rate, regular rhythm, normal heart sounds and intact distal pulses.  Exam reveals no gallop and no friction rub.   No murmur heard. Pulmonary/Chest: Effort normal and breath sounds normal. She has no wheezes. She has no rales.  Abdominal: Soft. Bowel sounds are normal. She exhibits no mass. There is no hepatosplenomegaly. There is no tenderness. There is no rigidity and no guarding.  Musculoskeletal: Normal range of motion. She exhibits no edema.  Lymphadenopathy:    She has no cervical adenopathy.  Neurological: She is alert. She has normal reflexes.  Skin: Skin is warm and dry. She is not diaphoretic.  Psychiatric: Mood and affect normal.  Nursing note and vitals reviewed.     Assessment & Plan  Problem List Items Addressed This Visit      Cardiovascular and Mediastinum   Essential hypertension   Relevant Medications   hydrochlorothiazide (HYDRODIURIL) 12.5 MG tablet   losartan (COZAAR) 100 MG tablet   metoprolol succinate (TOPROL-XL)  50 MG 24 hr tablet   pravastatin (PRAVACHOL) 40 MG tablet   Other Relevant Orders   Renal Function Panel     Other   Mixed hyperlipidemia   Relevant Medications   hydrochlorothiazide (HYDRODIURIL) 12.5 MG tablet   losartan (COZAAR) 100 MG tablet   metoprolol succinate (TOPROL-XL) 50 MG 24 hr tablet   pravastatin (PRAVACHOL) 40 MG tablet   Other Relevant Orders   Lipid Profile   Anemia due to folic acid deficiency   Relevant Medications   folic acid (FOLVITE) 267 MCG tablet   Other Relevant Orders   CBC with Differential/Platelet    Other Visit Diagnoses    Need for immunization against influenza    -  Primary   Relevant Orders   Flu vaccine HIGH DOSE PF (Fluzone High dose) (Completed)   Generalized abdominal pain        Relevant Orders   Hepatic Function Panel (6)   POCT Urinalysis Dipstick   Esophageal dysphagia          Meds ordered this encounter  Medications  . folic acid (FOLVITE) 124 MCG tablet    Sig: Take 1 tablet (800 mcg total) by mouth daily. Pt needs to take 1 whole pill, not the half- please d/c the half tablet    Dispense:  90 tablet    Refill:  3  . hydrochlorothiazide (HYDRODIURIL) 12.5 MG tablet    Sig: Take 1 tablet (12.5 mg total) by mouth daily.    Dispense:  90 tablet    Refill:  1  . losartan (COZAAR) 100 MG tablet    Sig: Take 1 tablet (100 mg total) by mouth daily.    Dispense:  90 tablet    Refill:  1  . metoprolol succinate (TOPROL-XL) 50 MG 24 hr tablet    Sig: Take 1 tablet (50 mg total) by mouth daily. Take with or immediately following a meal.    Dispense:  90 tablet    Refill:  1  . pravastatin (PRAVACHOL) 40 MG tablet    Sig: Take 1 tablet (40 mg total) by mouth daily.    Dispense:  90 tablet    Refill:  1      Dr. Otilio Miu [facility]  11/01/16

## 2016-11-02 LAB — RENAL FUNCTION PANEL
ALBUMIN: 4.4 g/dL (ref 3.6–4.8)
BUN/Creatinine Ratio: 14 (ref 12–28)
BUN: 17 mg/dL (ref 8–27)
CALCIUM: 10.2 mg/dL (ref 8.7–10.3)
CHLORIDE: 102 mmol/L (ref 96–106)
CO2: 24 mmol/L (ref 20–29)
Creatinine, Ser: 1.23 mg/dL — ABNORMAL HIGH (ref 0.57–1.00)
GFR calc non Af Amer: 45 mL/min/{1.73_m2} — ABNORMAL LOW (ref >59)
GFR, EST AFRICAN AMERICAN: 52 mL/min/{1.73_m2} — AB (ref >59)
GLUCOSE: 87 mg/dL (ref 65–99)
POTASSIUM: 4.1 mmol/L (ref 3.5–5.2)
Phosphorus: 3.4 mg/dL (ref 2.5–4.5)
Sodium: 144 mmol/L (ref 134–144)

## 2016-11-02 LAB — LIPID PANEL
CHOLESTEROL TOTAL: 200 mg/dL — AB (ref 100–199)
Chol/HDL Ratio: 4.4 ratio (ref 0.0–4.4)
HDL: 45 mg/dL (ref >39)
LDL Calculated: 122 mg/dL — ABNORMAL HIGH (ref 0–99)
Triglycerides: 164 mg/dL — ABNORMAL HIGH (ref 0–149)
VLDL CHOLESTEROL CAL: 33 mg/dL (ref 5–40)

## 2016-11-02 LAB — CBC WITH DIFFERENTIAL/PLATELET
BASOS: 1 %
Basophils Absolute: 0 10*3/uL (ref 0.0–0.2)
EOS (ABSOLUTE): 0.2 10*3/uL (ref 0.0–0.4)
EOS: 5 %
HEMATOCRIT: 40.9 % (ref 34.0–46.6)
HEMOGLOBIN: 14.4 g/dL (ref 11.1–15.9)
IMMATURE GRANULOCYTES: 0 %
Immature Grans (Abs): 0 10*3/uL (ref 0.0–0.1)
Lymphocytes Absolute: 2.5 10*3/uL (ref 0.7–3.1)
Lymphs: 48 %
MCH: 31.2 pg (ref 26.6–33.0)
MCHC: 35.2 g/dL (ref 31.5–35.7)
MCV: 89 fL (ref 79–97)
MONOCYTES: 6 %
Monocytes Absolute: 0.3 10*3/uL (ref 0.1–0.9)
NEUTROS PCT: 40 %
Neutrophils Absolute: 2.1 10*3/uL (ref 1.4–7.0)
Platelets: 287 10*3/uL (ref 150–379)
RBC: 4.62 x10E6/uL (ref 3.77–5.28)
RDW: 14.5 % (ref 12.3–15.4)
WBC: 5.2 10*3/uL (ref 3.4–10.8)

## 2016-11-02 LAB — HEPATIC FUNCTION PANEL (6)
ALK PHOS: 72 IU/L (ref 39–117)
ALT: 16 IU/L (ref 0–32)
AST: 23 IU/L (ref 0–40)
BILIRUBIN, DIRECT: 0.18 mg/dL (ref 0.00–0.40)
Bilirubin Total: 0.9 mg/dL (ref 0.0–1.2)

## 2016-11-15 ENCOUNTER — Encounter: Payer: Self-pay | Admitting: Family Medicine

## 2016-11-15 ENCOUNTER — Ambulatory Visit (INDEPENDENT_AMBULATORY_CARE_PROVIDER_SITE_OTHER): Payer: Medicare HMO | Admitting: Family Medicine

## 2016-11-15 VITALS — BP 120/80 | HR 60 | Ht 67.0 in | Wt 156.0 lb

## 2016-11-15 DIAGNOSIS — R1084 Generalized abdominal pain: Secondary | ICD-10-CM

## 2016-11-15 DIAGNOSIS — R1319 Other dysphagia: Secondary | ICD-10-CM

## 2016-11-15 DIAGNOSIS — R102 Pelvic and perineal pain: Secondary | ICD-10-CM

## 2016-11-15 DIAGNOSIS — R1310 Dysphagia, unspecified: Secondary | ICD-10-CM | POA: Diagnosis not present

## 2016-11-15 LAB — HEMOCCULT GUIAC POC 1CARD (OFFICE): Fecal Occult Blood, POC: NEGATIVE

## 2016-11-15 NOTE — Progress Notes (Signed)
Name: Taylor Griffin   MRN: [id number]    DOB: 1950/09/20   Date:11/15/2016       Progress Note  Subjective  Chief Complaint  Chief Complaint  Patient presents with  . Abdominal Pain    upper middle abdominal pain- feels like "when you have diarrhea"- hasn't had any changes in BM- been going on for approx 6 months    Abdominal Pain  This is a chronic problem. The current episode started more than 1 month ago (6 months). The onset quality is gradual. The problem occurs intermittently. Duration: not over 30 minutes. The problem has been unchanged. The pain is located in the generalized abdominal region. The quality of the pain is cramping. The abdominal pain does not radiate. Pertinent negatives include no anorexia, arthralgias, belching, constipation, diarrhea, dysuria, fever, flatus, frequency, headaches, hematochezia, hematuria, melena, myalgias, nausea, vomiting or weight loss. Nothing aggravates the pain. The pain is relieved by nothing. She has tried acetaminophen for the symptoms. The treatment provided mild relief. Prior diagnostic workup includes lower endoscopy. There is no history of colon cancer, Crohn's disease, gallstones or pancreatitis.    No problem-specific Assessment & Plan notes found for this encounter.   Past Medical History:  Diagnosis Date  . Anemia   . Arthritis    WRIST AND FINGERS  . Hyperlipidemia   . Hypertension    CONTROLLED WITH MEDS    Past Surgical History:  Procedure Laterality Date  . COLONOSCOPY WITH PROPOFOL N/A 11/22/2014   Procedure: COLONOSCOPY WITH PROPOFOL;  Surgeon: Lucilla Lame, MD;  Location: [facility];  Service: Endoscopy;  Laterality: N/A;  LATEX ALLERGY  . ECTOPIC PREGNANCY SURGERY      Family History  Problem Relation Age of Onset  . Diabetes Mother   . Hypertension Mother   . Breast cancer Sister 19    Social History   Social History  . Marital status: Married    Spouse name: N/A  . Number of children:  N/A  . Years of education: N/A   Occupational History  . Not on file.   Social History Main Topics  . Smoking status: Former Smoker    Packs/day: 0.50    Years: 18.00    Types: Cigarettes  . Smokeless tobacco: Never Used  . Alcohol use No  . Drug use: No  . Sexual activity: No   Other Topics Concern  . Not on file   Social History Narrative  . No narrative on file    Allergies  Allergen Reactions  . Latex Itching    Outpatient Medications Prior to Visit  Medication Sig Dispense Refill  . aspirin 81 MG tablet Take 1 tablet (81 mg total) by mouth daily. AM 30 tablet 11  . folic acid (FOLVITE) 478 MCG tablet Take 1 tablet (800 mcg total) by mouth daily. Pt needs to take 1 whole pill, not the half- please d/c the half tablet 90 tablet 3  . hydrochlorothiazide (HYDRODIURIL) 12.5 MG tablet Take 1 tablet (12.5 mg total) by mouth daily. 90 tablet 1  . losartan (COZAAR) 100 MG tablet Take 1 tablet (100 mg total) by mouth daily. 90 tablet 1  . metoprolol succinate (TOPROL-XL) 50 MG 24 hr tablet Take 1 tablet (50 mg total) by mouth daily. Take with or immediately following a meal. 90 tablet 1  . Multiple Vitamins-Minerals (MULTIVITAMIN WITH MINERALS) tablet Take 1 tablet by mouth daily.    . pravastatin (PRAVACHOL) 40 MG tablet Take 1 tablet (40 mg total)  by mouth daily. 90 tablet 1   No facility-administered medications prior to visit.     Review of Systems  Constitutional: Negative for chills, fever, malaise/fatigue and weight loss.  HENT: Negative for ear discharge, ear pain and sore throat.   Eyes: Negative for blurred vision.  Respiratory: Negative for cough, sputum production, shortness of breath and wheezing.   Cardiovascular: Negative for chest pain, palpitations and leg swelling.  Gastrointestinal: Positive for abdominal pain. Negative for anorexia, blood in stool, constipation, diarrhea, flatus, heartburn, hematochezia, melena, nausea and vomiting.  Genitourinary:  Negative for dysuria, frequency, hematuria and urgency.  Musculoskeletal: Negative for arthralgias, back pain, joint pain, myalgias and neck pain.  Skin: Negative for rash.  Neurological: Negative for dizziness, tingling, sensory change, focal weakness and headaches.  Endo/Heme/Allergies: Negative for environmental allergies and polydipsia. Does not bruise/bleed easily.  Psychiatric/Behavioral: Negative for depression and suicidal ideas. The patient is not nervous/anxious and does not have insomnia.      Objective  Vitals:   11/15/16 1041  BP: 120/80  Pulse: 60  Weight: 156 lb (70.8 kg)  Height: 5\' 7"  (1.702 m)    Physical Exam  Constitutional: She is well-developed, well-nourished, and in no distress. No distress.  HENT:  Head: Normocephalic and atraumatic.  Right Ear: External ear normal.  Left Ear: External ear normal.  Nose: Nose normal.  Mouth/Throat: Oropharynx is clear and moist.  Eyes: Pupils are equal, round, and reactive to light. Conjunctivae and EOM are normal. Right eye exhibits no discharge. Left eye exhibits no discharge.  Neck: Normal range of motion. Neck supple. No JVD present. No thyromegaly present.  Cardiovascular: Normal rate, regular rhythm, normal heart sounds and intact distal pulses.  Exam reveals no gallop and no friction rub.   No murmur heard. Pulmonary/Chest: Effort normal and breath sounds normal. No respiratory distress. She has no wheezes. She has no rales. She exhibits no tenderness.  Abdominal: Soft. Bowel sounds are normal. She exhibits no mass. There is no tenderness. There is no guarding.  Genitourinary: Rectum normal. Rectal exam shows no external hemorrhoid and guaiac negative stool.  Musculoskeletal: Normal range of motion. She exhibits no edema.  Lymphadenopathy:    She has no cervical adenopathy.  Neurological: She is alert. She has normal reflexes.  Skin: Skin is warm and dry. She is not diaphoretic.  Psychiatric: Mood and affect  normal.  Nursing note and vitals reviewed.     Assessment & Plan  Problem List Items Addressed This Visit    None    Visit Diagnoses    Generalized abdominal pain    -  Primary   Relevant Orders   Hepatic Function Panel (6)   CBC with Differential/Platelet   Lipase   US Abdomen Complete   POCT occult blood stool (Completed)   US Transvaginal Non-OB   US Pelvis Complete   Pelvic pain       Relevant Orders   US Pelvis Complete   Esophageal dysphagia       Relevant Orders   Ambulatory referral to Gastroenterology      No orders of the defined types were placed in this encounter.     Dr. [facility]  11/15/16

## 2016-11-16 LAB — CBC WITH DIFFERENTIAL/PLATELET
BASOS: 1 %
Basophils Absolute: 0.1 10*3/uL (ref 0.0–0.2)
EOS (ABSOLUTE): 0.2 10*3/uL (ref 0.0–0.4)
EOS: 4 %
HEMATOCRIT: 42.1 % (ref 34.0–46.6)
HEMOGLOBIN: 14.4 g/dL (ref 11.1–15.9)
IMMATURE GRANS (ABS): 0 10*3/uL (ref 0.0–0.1)
IMMATURE GRANULOCYTES: 0 %
Lymphocytes Absolute: 2.4 10*3/uL (ref 0.7–3.1)
Lymphs: 46 %
MCH: 30.8 pg (ref 26.6–33.0)
MCHC: 34.2 g/dL (ref 31.5–35.7)
MCV: 90 fL (ref 79–97)
MONOCYTES: 5 %
MONOS ABS: 0.3 10*3/uL (ref 0.1–0.9)
NEUTROS PCT: 44 %
Neutrophils Absolute: 2.3 10*3/uL (ref 1.4–7.0)
Platelets: 329 10*3/uL (ref 150–379)
RBC: 4.67 x10E6/uL (ref 3.77–5.28)
RDW: 14.4 % (ref 12.3–15.4)
WBC: 5.3 10*3/uL (ref 3.4–10.8)

## 2016-11-16 LAB — HEPATIC FUNCTION PANEL (6)
ALBUMIN: 4.5 g/dL (ref 3.6–4.8)
ALT: 18 IU/L (ref 0–32)
AST: 24 IU/L (ref 0–40)
Alkaline Phosphatase: 71 IU/L (ref 39–117)
Bilirubin Total: 0.9 mg/dL (ref 0.0–1.2)
Bilirubin, Direct: 0.18 mg/dL (ref 0.00–0.40)

## 2016-11-16 LAB — LIPASE: LIPASE: 40 U/L (ref 14–72)

## 2016-11-18 ENCOUNTER — Ambulatory Visit
Admission: RE | Admit: 2016-11-18 | Discharge: 2016-11-18 | Disposition: A | Discharge status: Home / Self Care | Payer: Medicare HMO | Source: Ambulatory Visit | Attending: Family Medicine | Admitting: Family Medicine

## 2016-11-18 DIAGNOSIS — R1084 Generalized abdominal pain: Secondary | ICD-10-CM | POA: Insufficient documentation

## 2016-11-18 DIAGNOSIS — R93421 Abnormal radiologic findings on diagnostic imaging of right kidney: Secondary | ICD-10-CM | POA: Insufficient documentation

## 2016-11-18 DIAGNOSIS — N289 Disorder of kidney and ureter, unspecified: Secondary | ICD-10-CM | POA: Diagnosis not present

## 2016-11-18 DIAGNOSIS — R102 Pelvic and perineal pain: Secondary | ICD-10-CM

## 2016-11-18 DIAGNOSIS — R938 Abnormal findings on diagnostic imaging of other specified body structures: Secondary | ICD-10-CM | POA: Diagnosis not present

## 2016-12-20 ENCOUNTER — Encounter (INDEPENDENT_AMBULATORY_CARE_PROVIDER_SITE_OTHER): Payer: Self-pay

## 2016-12-20 ENCOUNTER — Ambulatory Visit (INDEPENDENT_AMBULATORY_CARE_PROVIDER_SITE_OTHER): Payer: Medicare HMO | Admitting: Gastroenterology

## 2016-12-20 ENCOUNTER — Other Ambulatory Visit: Payer: Self-pay

## 2016-12-20 VITALS — BP 144/77 | HR 66 | Temp 98.3°F | Ht 67.0 in | Wt 156.0 lb

## 2016-12-20 DIAGNOSIS — R1310 Dysphagia, unspecified: Secondary | ICD-10-CM | POA: Diagnosis not present

## 2016-12-20 DIAGNOSIS — R1319 Other dysphagia: Secondary | ICD-10-CM

## 2016-12-20 DIAGNOSIS — R12 Heartburn: Secondary | ICD-10-CM | POA: Diagnosis not present

## 2016-12-20 MED ORDER — OMEPRAZOLE 40 MG PO CPDR: 40.0000 mg | DELAYED_RELEASE_CAPSULE | Freq: Every day | ORAL | 0 refills | Status: DC

## 2016-12-20 NOTE — Progress Notes (Signed)
Cephas Darby, MD [address]  [place]  [place], [place] [address]  Main: [phone]  Fax: [phone]    Gastroenterology Consultation  Referring Provider:     Juline Patch, MD Primary Care Physician:  Juline Patch, MD Primary Gastroenterologist:  Dr. Cephas Darby Reason for Consultation:     Dysphagia        HPI:   Taylor Griffin is a 66 y.o. y/o female referred by Dr. Juline Patch, MD  for consultation & management of dysphagia. Chronic dysphagia, "sensation of something stuck" since 2011. X-ray UGI at that time was unremarkable. Symptoms are frequent in last few weeks, most of days in a week, only with solids, often preceded by hiccups, did not lose weight, but a/w retrosternal burning pain and regurgiattion. Notices mostly with fried chicken. Denies food impaction. Bloating with milk products. Denies episodes of food impaction Did not try antacids so far Ex-smoker, quit 1 yr ago, 30+ yrs No GI surgeries in the past GI Procedures: Colonoscopy 2016 by Dr Allen Norris, found to have polyps. Due in 2021 Denies fam h/o GI malignancy  Past Medical History:  Diagnosis Date  . Anemia   . Arthritis    WRIST AND FINGERS  . Hyperlipidemia   . Hypertension    CONTROLLED WITH MEDS    Past Surgical History:  Procedure Laterality Date  . COLONOSCOPY WITH PROPOFOL N/A 11/22/2014   Procedure: COLONOSCOPY WITH PROPOFOL;  Surgeon: Lucilla Lame, MD;  Location: [facility];  Service: Endoscopy;  Laterality: N/A;  LATEX ALLERGY  . ECTOPIC PREGNANCY SURGERY      Prior to Admission medications   Medication Sig Start Date End Date Taking? Authorizing Provider  aspirin 81 MG tablet Take 1 tablet (81 mg total) by mouth daily. AM 05/03/16  Yes Juline Patch, MD  folic acid (FOLVITE) 627 MCG tablet Take 1 tablet (800 mcg total) by mouth daily. Pt needs to take 1 whole pill, not the half- please d/c the half tablet 11/01/16  Yes Juline Patch, MD    hydrochlorothiazide (HYDRODIURIL) 12.5 MG tablet Take 1 tablet (12.5 mg total) by mouth daily. 11/01/16  Yes Juline Patch, MD  losartan (COZAAR) 100 MG tablet Take 1 tablet (100 mg total) by mouth daily. 11/01/16  Yes Juline Patch, MD  metoprolol succinate (TOPROL-XL) 50 MG 24 hr tablet Take 1 tablet (50 mg total) by mouth daily. Take with or immediately following a meal. 11/01/16  Yes Juline Patch, MD  Multiple Vitamins-Minerals (MULTIVITAMIN WITH MINERALS) tablet Take 1 tablet by mouth daily.   Yes [provider]  pravastatin (PRAVACHOL) 40 MG tablet Take 1 tablet (40 mg total) by mouth daily. 11/01/16  Yes Juline Patch, MD    Family History  Problem Relation Age of Onset  . Diabetes Mother   . Hypertension Mother   . Breast cancer Sister 58     Social History  Substance Use Topics  . Smoking status: Former Smoker    Packs/day: 0.50    Years: 18.00    Types: Cigarettes  . Smokeless tobacco: Never Used  . Alcohol use No    Allergies as of 12/20/2016 - Review Complete 12/20/2016  Allergen Reaction Noted  . Latex Itching 11/14/2014    Review of Systems:    All systems reviewed and negative except where noted in HPI.   Physical Exam:  BP (!) 144/77   Pulse 66   Temp 98.3 F (36.8  C) (Oral)   Ht 5\' 7"  (1.702 m)   Wt 156 lb (70.8 kg)   BMI 24.43 kg/m  No LMP recorded. Patient is postmenopausal.  General:   Alert,  Well-developed, well-nourished, pleasant and cooperative in NAD Head:  Normocephalic and atraumatic. Eyes:  Sclera clear, no icterus.   Conjunctiva pink. Ears:  Normal auditory acuity. Nose:  No deformity, discharge, or lesions. Mouth:  No deformity or lesions,oropharynx pink & moist. Neck:  Supple; no masses or thyromegaly. Lungs:  Respirations even and unlabored.  Clear throughout to auscultation.   No wheezes, crackles, or rhonchi. No acute distress. Heart:  Regular rate and rhythm; no murmurs, clicks, rubs, or gallops. Abdomen:  Normal  bowel sounds.  No bruits.  Soft, non-tender and non-distended without masses, hepatosplenomegaly or hernias noted.  No guarding or rebound tenderness.   Rectal: Nor performed Msk:  Symmetrical without gross deformities. Good, equal movement & strength bilaterally. Pulses:  Normal pulses noted. Extremities:  No clubbing or edema.  No cyanosis. Neurologic:  Alert and oriented x3;  grossly normal neurologically. Skin:  Intact without significant lesions or rashes. No jaundice. Lymph Nodes:  No significant cervical adenopathy. Psych:  Alert and cooperative. Normal mood and affect.  Imaging Studies: X ray UGI 2011 RESULT:     Barium swallow upper GI examination is performed with a double  contrast technique. The patient easily ingested the liquid barium. The  esophageal mucosa and morphology appear normal. The stomach distends  fully.  The gastric mucosa is unremarkable. Barium flows easily into the small  bowel. There is gastroesophageal reflux demonstrated into the mid to  proximal esophagus with and without reflux maneuvers. No esophageal  mucosal  deformity or esophageal stenosis was evident. Good esophageal peristalsis  was present with a prone swallow with some limited proximal escape  demonstrated.   Assessment and Plan:   Taylor Griffin is a 66 y.o. female, ex- smoker, no major medical problems, chronic intermittent dysphagia to solids only, heart burn with no weight loss, anemia.  - Trail of omeprazole 40mg  daily for 8-12weeks - EGD  I have discussed alternative options, risks & benefits,  which include, but are not limited to, bleeding, infection, perforation,respiratory complication & drug reaction.  The patient agrees with this plan & written consent will be obtained.    Follow up after EGD   Cephas Darby, MD

## 2017-01-25 NOTE — Discharge Instructions (Signed)
General Anesthesia, Adult, Care After °These instructions provide you with information about caring for yourself after your procedure. Your health care provider may also give you more specific instructions. Your treatment has been planned according to current medical practices, but problems sometimes occur. Call your health care provider if you have any problems or questions after your procedure. °What can I expect after the procedure? °After the procedure, it is common to have: °· Vomiting. °· A sore throat. °· Mental slowness. ° °It is common to feel: °· Nauseous. °· Cold or shivery. °· Sleepy. °· Tired. °· Sore or achy, even in parts of your body where you did not have surgery. ° °Follow these instructions at home: °For at least 24 hours after the procedure: °· Do not: °? Participate in activities where you could fall or become injured. °? Drive. °? Use heavy machinery. °? Drink alcohol. °? Take sleeping pills or medicines that cause drowsiness. °? Make important decisions or sign legal documents. °? Take care of children on your own. °· Rest. °Eating and drinking °· If you vomit, drink water, juice, or soup when you can drink without vomiting. °· Drink enough fluid to keep your urine clear or pale yellow. °· Make sure you have little or no nausea before eating solid foods. °· Follow the diet recommended by your health care provider. °General instructions °· Have a responsible adult stay with you until you are awake and alert. °· Return to your normal activities as told by your health care provider. Ask your health care provider what activities are safe for you. °· Take over-the-counter and prescription medicines only as told by your health care provider. °· If you smoke, do not smoke without supervision. °· Keep all follow-up visits as told by your health care provider. This is important. °Contact a health care provider if: °· You continue to have nausea or vomiting at home, and medicines are not helpful. °· You  cannot drink fluids or start eating again. °· You cannot urinate after 8-12 hours. °· You develop a skin rash. °· You have fever. °· You have increasing redness at the site of your procedure. °Get help right away if: °· You have difficulty breathing. °· You have chest pain. °· You have unexpected bleeding. °· You feel that you are having a life-threatening or urgent problem. °This information is not intended to replace advice given to you by your health care provider. Make sure you discuss any questions you have with your health care provider. °Document Released: 05/31/2000 Document Revised: 07/28/2015 Document Reviewed: 02/06/2015 °Elsevier Interactive Patient Education © 2018 [employer] ° °

## 2017-01-26 ENCOUNTER — Ambulatory Visit
Admission: RE | Admit: 2017-01-26 | Discharge: 2017-01-26 | Disposition: A | Discharge status: Home / Self Care | Payer: Medicare HMO | Source: Ambulatory Visit | Attending: Gastroenterology | Admitting: Gastroenterology

## 2017-01-26 ENCOUNTER — Ambulatory Visit: Payer: Medicare HMO | Admitting: Anesthesiology

## 2017-01-26 ENCOUNTER — Encounter
Admission: RE | Disposition: A | Discharge status: Home / Self Care | Payer: Self-pay | Source: Ambulatory Visit | Attending: Gastroenterology

## 2017-01-26 DIAGNOSIS — R1319 Other dysphagia: Secondary | ICD-10-CM

## 2017-01-26 DIAGNOSIS — E785 Hyperlipidemia, unspecified: Secondary | ICD-10-CM | POA: Diagnosis not present

## 2017-01-26 DIAGNOSIS — Z7982 Long term (current) use of aspirin: Secondary | ICD-10-CM | POA: Diagnosis not present

## 2017-01-26 DIAGNOSIS — I10 Essential (primary) hypertension: Secondary | ICD-10-CM | POA: Insufficient documentation

## 2017-01-26 DIAGNOSIS — Z79899 Other long term (current) drug therapy: Secondary | ICD-10-CM | POA: Insufficient documentation

## 2017-01-26 DIAGNOSIS — Z87891 Personal history of nicotine dependence: Secondary | ICD-10-CM | POA: Insufficient documentation

## 2017-01-26 DIAGNOSIS — R12 Heartburn: Secondary | ICD-10-CM

## 2017-01-26 DIAGNOSIS — R1310 Dysphagia, unspecified: Secondary | ICD-10-CM | POA: Diagnosis not present

## 2017-01-26 DIAGNOSIS — K222 Esophageal obstruction: Secondary | ICD-10-CM | POA: Insufficient documentation

## 2017-01-26 HISTORY — PX: ESOPHAGOGASTRODUODENOSCOPY (EGD) WITH PROPOFOL: SHX5813

## 2017-01-26 SURGERY — ESOPHAGOGASTRODUODENOSCOPY (EGD) WITH PROPOFOL
Anesthesia: General | Wound class: Clean Contaminated

## 2017-01-26 MED ORDER — LACTATED RINGERS IV SOLN: INTRAVENOUS | Status: DC

## 2017-01-26 MED ORDER — LACTATED RINGERS IV SOLN
1000.0000 mL | INTRAVENOUS | Status: DC
  Administered 2017-01-26: 1000 mL via INTRAVENOUS

## 2017-01-26 MED ORDER — PROPOFOL 10 MG/ML IV BOLUS
INTRAVENOUS | Status: DC | PRN
  Administered 2017-01-26: 80 mg via INTRAVENOUS
  Administered 2017-01-26: 20 mg via INTRAVENOUS
  Administered 2017-01-26: 50 mg via INTRAVENOUS

## 2017-01-26 MED ORDER — ACETAMINOPHEN 160 MG/5ML PO SOLN: 325.0000 mg | Freq: Once | ORAL | Status: DC

## 2017-01-26 MED ORDER — ACETAMINOPHEN 325 MG PO TABS: 325.0000 mg | ORAL_TABLET | Freq: Once | ORAL | Status: DC

## 2017-01-26 MED ORDER — SIMETHICONE 40 MG/0.6ML PO SUSP
ORAL | Status: DC | PRN
  Administered 2017-01-26: 09:00:00

## 2017-01-26 MED ORDER — GLYCOPYRROLATE 0.2 MG/ML IJ SOLN
INTRAMUSCULAR | Status: DC | PRN
  Administered 2017-01-26: 0.1 mg via INTRAVENOUS

## 2017-01-26 MED ORDER — LIDOCAINE HCL (CARDIAC) 20 MG/ML IV SOLN
INTRAVENOUS | Status: DC | PRN
  Administered 2017-01-26: 40 mg via INTRAVENOUS

## 2017-01-26 SURGICAL SUPPLY — 32 items
BALLN DILATOR 10-12 8 (BALLOONS)
BALLN DILATOR 12-15 8 (BALLOONS)
BALLN DILATOR 15-18 8 (BALLOONS)
BALLN DILATOR CRE 0-12 8 (BALLOONS)
BALLN DILATOR ESOPH 8 10 CRE (MISCELLANEOUS) IMPLANT
BALLOON DILATOR 12-15 8 (BALLOONS) IMPLANT
BALLOON DILATOR 15-18 8 (BALLOONS) IMPLANT
BALLOON DILATOR CRE 0-12 8 (BALLOONS) IMPLANT
BLOCK BITE 60FR ADLT L/F GRN (MISCELLANEOUS) ×2 IMPLANT
CANISTER SUCT 1200ML W/VALVE (MISCELLANEOUS) ×2 IMPLANT
CLIP HMST 235XBRD CATH ROT (MISCELLANEOUS) IMPLANT
CLIP RESOLUTION 360 11X235 (MISCELLANEOUS)
FCP ESCP3.2XJMB 240X2.8X (MISCELLANEOUS)
FORCEPS BIOP RAD 4 LRG CAP 4 (CUTTING FORCEPS) IMPLANT
FORCEPS BIOP RJ4 240 W/NDL (MISCELLANEOUS)
FORCEPS ESCP3.2XJMB 240X2.8X (MISCELLANEOUS) IMPLANT
GOWN CVR UNV OPN BCK APRN NK (MISCELLANEOUS) ×2 IMPLANT
GOWN ISOL THUMB LOOP REG UNIV (MISCELLANEOUS) ×2
INJECTOR VARIJECT VIN23 (MISCELLANEOUS) IMPLANT
KIT DEFENDO VALVE AND CONN (KITS) IMPLANT
KIT ENDO PROCEDURE OLY (KITS) ×2 IMPLANT
MARKER SPOT ENDO TATTOO 5ML (MISCELLANEOUS) IMPLANT
PAD GROUND ADULT SPLIT (MISCELLANEOUS) IMPLANT
RETRIEVER NET PLAT FOOD (MISCELLANEOUS) IMPLANT
SNARE SHORT THROW 13M SML OVAL (MISCELLANEOUS) IMPLANT
SNARE SHORT THROW 30M LRG OVAL (MISCELLANEOUS) IMPLANT
SPOT EX ENDOSCOPIC TATTOO (MISCELLANEOUS)
SYR INFLATION 60ML (SYRINGE) IMPLANT
TRAP ETRAP POLY (MISCELLANEOUS) IMPLANT
VARIJECT INJECTOR VIN23 (MISCELLANEOUS)
WATER STERILE IRR 250ML POUR (IV SOLUTION) ×2 IMPLANT
WIRE CRE 18-20MM 8CM F G (MISCELLANEOUS) IMPLANT

## 2017-01-26 NOTE — Op Note (Signed)
[facility] Gastroenterology Patient Name: Taylor Griffin Procedure Date: 01/26/2017 8:39 AM MRN: [id number] Account #: [id number] Date of Birth: 09-03-1950 Admit Type: Outpatient Age: 66 Room: [facility] OR ROOM 01 Gender: Female Note Status: Finalized Procedure:            Upper GI endoscopy Indications:          Dysphagia Providers:            Lin Landsman MD, MD, Juline Patch, MD Medicines:            Monitored Anesthesia Care Complications:        No immediate complications. Estimated blood loss: None. Procedure:            Pre-Anesthesia Assessment:                       - Prior to the procedure, a History and Physical was                        performed, and patient medications and allergies were                        reviewed. The patient is competent. The risks and                        benefits of the procedure and the sedation options and                        risks were discussed with the patient. All questions                        were answered and informed consent was obtained.                        Patient identification and proposed procedure were                        verified by the physician, the nurse, the                        anesthesiologist, the anesthetist and the technician in                        the pre-procedure area in the procedure room. Mental                        Status Examination: alert and oriented. Airway                        Examination: normal oropharyngeal airway and neck                        mobility. Respiratory Examination: clear to                        auscultation. CV Examination: normal. Prophylactic                        Antibiotics: The patient does not require prophylactic  antibiotics. Prior Anticoagulants: The patient has                        taken no previous anticoagulant or antiplatelet agents.                        ASA Grade Assessment: II - A patient with mild  systemic                        disease. After reviewing the risks and benefits, the                        patient was deemed in satisfactory condition to undergo                        the procedure. The anesthesia plan was to use monitored                        anesthesia care (MAC). Immediately prior to                        administration of medications, the patient was                        re-assessed for adequacy to receive sedatives. The                        heart rate, respiratory rate, oxygen saturations, blood                        pressure, adequacy of pulmonary ventilation, and                        response to care were monitored throughout the                        procedure. The physical status of the patient was                        re-assessed after the procedure.                       After obtaining informed consent, the endoscope was                        passed under direct vision. Throughout the procedure,                        the patient's blood pressure, pulse, and oxygen                        saturations were monitored continuously. The Olympus                        GIF-HQ190 Endoscope (S#. [id number]) was introduced                        through the mouth, and advanced to the second part of  duodenum. The upper GI endoscopy was accomplished                        without difficulty. The patient tolerated the procedure                        well. Findings:      The duodenal bulb and second portion of the duodenum were normal.      The entire examined stomach was normal.      The cardia and gastric fundus were normal on retroflexion.      Esophagogastric landmarks were identified: the gastroesophageal junction       was found at 40 cm from the incisors.      A widely patent and non-obstructing Schatzki ring (acquired) was found       at the gastroesophageal junction.      The examined esophagus was normal. Biopsies were  obtained from the       proximal and distal esophagus with cold forceps for histology of       suspected eosinophilic esophagitis. Impression:           - Normal duodenal bulb and second portion of the                        duodenum.                       - Normal stomach.                       - Esophagogastric landmarks identified.                       - Widely patent and non-obstructing Schatzki ring.                       - Normal esophagus. Biopsied. Recommendation:       - Discharge patient to home.                       - Resume previous diet.                       - Continue present medications.                       - Await pathology results.                       - Return to my office in 2 months. Procedure Code(s):    --- Professional ---                       [phone], Esophagogastroduodenoscopy, flexible, transoral;                        with biopsy, single or multiple Diagnosis Code(s):    --- Professional ---                       K22.2, Esophageal obstruction                       R13.10, Dysphagia, unspecified CPT copyright 2016 American Medical Association. All rights reserved. The codes documented in this report  are preliminary and upon coder review may  be revised to meet current compliance requirements. Dr. Ulyess Mort Lin Landsman MD, MD 01/26/2017 9:06:22 AM This report has been signed electronically. Juline Patch, MD Number of Addenda: 0 Note Initiated On: 01/26/2017 8:39 AM      [facility]

## 2017-01-26 NOTE — Anesthesia Postprocedure Evaluation (Signed)
Anesthesia Post Note  Patient: Faryal [place] Wipperfurth  Procedure(s) Performed: ESOPHAGOGASTRODUODENOSCOPY (EGD) WITH PROPOFOL (N/A )  Patient location during evaluation: PACU Anesthesia Type: General Level of consciousness: awake and alert and oriented Pain management: satisfactory to patient Vital Signs Assessment: post-procedure vital signs reviewed and stable Respiratory status: spontaneous breathing, nonlabored ventilation and respiratory function stable Cardiovascular status: blood pressure returned to baseline and stable Postop Assessment: Adequate PO intake and No signs of nausea or vomiting Anesthetic complications: no    Raliegh Ip

## 2017-01-26 NOTE — Anesthesia Preprocedure Evaluation (Signed)
Anesthesia Evaluation  Patient identified by MRN, date of birth, ID band Patient awake    Reviewed: Allergy & Precautions, H&P , NPO status , Patient's Chart, lab work & pertinent test results  Airway Mallampati: II  TM Distance: >3 FB Neck ROM: full    Dental  (+) Missing   Pulmonary former smoker,    Pulmonary exam normal breath sounds clear to auscultation       Cardiovascular hypertension, Normal cardiovascular exam Rhythm:regular Rate:Normal     Neuro/Psych    GI/Hepatic   Endo/Other    Renal/GU      Musculoskeletal   Abdominal   Peds  Hematology   Anesthesia Other Findings   Reproductive/Obstetrics                             Anesthesia Physical Anesthesia Plan  ASA: II  Anesthesia Plan: General   Post-op Pain Management:    Induction:   PONV Risk Score and Plan: 3 and Propofol infusion  Airway Management Planned: Mask  Additional Equipment:   Intra-op Plan:   Post-operative Plan:   Informed Consent: I have reviewed the patients History and Physical, chart, labs and discussed the procedure including the risks, benefits and alternatives for the proposed anesthesia with the patient or authorized representative who has indicated his/her understanding and acceptance.     Plan Discussed with: CRNA  Anesthesia Plan Comments:         Anesthesia Quick Evaluation

## 2017-01-26 NOTE — Anesthesia Procedure Notes (Signed)
Procedure Name: MAC Date/Time: 01/26/2017 8:50 AM Performed by: Janna Arch, CRNA Pre-anesthesia Checklist: Patient identified, Emergency Drugs available, Suction available and Patient being monitored Patient Re-evaluated:Patient Re-evaluated prior to induction Oxygen Delivery Method: Nasal cannula

## 2017-01-26 NOTE — H&P (Signed)
Cephas Darby, MD [address]  [place], [place] [address]  Main: [phone]  Fax: [phone] Pager: [phone]  Primary Care Physician:  Juline Patch, MD Primary Gastroenterologist:  Dr. Cephas Darby  Pre-Procedure History & Physical: HPI:  Taylor Griffin is a 66 y.o. female is here for an endoscopy.   Past Medical History:  Diagnosis Date  . Anemia   . Arthritis    WRIST AND FINGERS  . Hyperlipidemia   . Hypertension    CONTROLLED WITH MEDS    Past Surgical History:  Procedure Laterality Date  . COLONOSCOPY WITH PROPOFOL N/A 11/22/2014   Procedure: COLONOSCOPY WITH PROPOFOL;  Surgeon: Lucilla Lame, MD;  Location: [place];  Service: Endoscopy;  Laterality: N/A;  LATEX ALLERGY  . ECTOPIC PREGNANCY SURGERY      Prior to Admission medications   Medication Sig Start Date End Date Taking? Authorizing Provider  aspirin 81 MG tablet Take 1 tablet (81 mg total) by mouth daily. AM 05/03/16  Yes Juline Patch, MD  folic acid (FOLVITE) 962 MCG tablet Take 1 tablet (800 mcg total) by mouth daily. Pt needs to take 1 whole pill, not the half- please d/c the half tablet 11/01/16  Yes Juline Patch, MD  hydrochlorothiazide (HYDRODIURIL) 12.5 MG tablet Take 1 tablet (12.5 mg total) by mouth daily. 11/01/16  Yes Juline Patch, MD  losartan (COZAAR) 100 MG tablet Take 1 tablet (100 mg total) by mouth daily. 11/01/16  Yes Juline Patch, MD  metoprolol succinate (TOPROL-XL) 50 MG 24 hr tablet Take 1 tablet (50 mg total) by mouth daily. Take with or immediately following a meal. 11/01/16  Yes Juline Patch, MD  Multiple Vitamins-Minerals (MULTIVITAMIN WITH MINERALS) tablet Take 1 tablet by mouth daily.   Yes [provider]  omeprazole (PRILOSEC) 40 MG capsule Take 1 capsule (40 mg total) by mouth daily. 12/20/16 03/20/17 Yes Roshad Hack, Tally Due, MD  pravastatin (PRAVACHOL) 40 MG tablet Take 1 tablet (40 mg total) by mouth daily. 11/01/16   Yes Juline Patch, MD    Allergies as of 12/20/2016 - Review Complete 12/20/2016  Allergen Reaction Noted  . Latex Itching 11/14/2014    Family History  Problem Relation Age of Onset  . Diabetes Mother   . Hypertension Mother   . Breast cancer Sister 10    Social History   Socioeconomic History  . Marital status: Married    Spouse name: Not on file  . Number of children: Not on file  . Years of education: Not on file  . Highest education level: Not on file  Social Needs  . Financial resource strain: Not on file  . Food insecurity - worry: Not on file  . Food insecurity - inability: Not on file  . Transportation needs - medical: Not on file  . Transportation needs - non-medical: Not on file  Occupational History  . Not on file  Tobacco Use  . Smoking status: Former Smoker    Packs/day: 0.50    Years: 18.00    Pack years: 9.00    Types: Cigarettes  . Smokeless tobacco: Never Used  Substance and Sexual Activity  . Alcohol use: No    Alcohol/week: 0.0 oz  . Drug use: No  . Sexual activity: No  Other Topics Concern  . Not on file  Social History Narrative  . Not on file    Review of Systems: See HPI, otherwise negative ROS  Physical Exam: BP 134/85   Pulse 64   Temp (!) 97.5 F (36.4 C) (Tympanic)   Resp 16   Ht 5\' 7"  (1.702 m)   Wt 155 lb (70.3 kg)   SpO2 97%   BMI 24.28 kg/m  General:   Alert,  pleasant and cooperative in NAD Head:  Normocephalic and atraumatic. Neck:  Supple; no masses or thyromegaly. Lungs:  Clear throughout to auscultation.    Heart:  Regular rate and rhythm. Abdomen:  Soft, nontender and nondistended. Normal bowel sounds, without guarding, and without rebound.   Neurologic:  Alert and  oriented x4;  grossly normal neurologically.  Impression/Plan: Taylor Griffin is here for an endoscopy to be performed for chronic intermittent dysphagia  Risks, benefits, limitations, and alternatives regarding  endoscopy have been  reviewed with the patient.  Questions have been answered.  All parties agreeable.   Sherri Sear, MD  01/26/2017, 7:49 AM

## 2017-01-26 NOTE — Transfer of Care (Signed)
Immediate Anesthesia Transfer of Care Note  Patient: Taylor Griffin  Procedure(s) Performed: ESOPHAGOGASTRODUODENOSCOPY (EGD) WITH PROPOFOL (N/A )  Patient Location: PACU  Anesthesia Type: General  Level of Consciousness: awake, alert  and patient cooperative  Airway and Oxygen Therapy: Patient Spontanous Breathing and Patient connected to supplemental oxygen  Post-op Assessment: Post-op Vital signs reviewed, Patient's Cardiovascular Status Stable, Respiratory Function Stable, Patent Airway and No signs of Nausea or vomiting  Post-op Vital Signs: Reviewed and stable  Complications: No apparent anesthesia complications

## 2017-01-28 ENCOUNTER — Encounter: Payer: Self-pay | Admitting: Gastroenterology

## 2017-02-01 ENCOUNTER — Encounter: Payer: Self-pay | Admitting: Gastroenterology

## 2017-02-02 ENCOUNTER — Encounter: Payer: Self-pay | Admitting: Gastroenterology

## 2017-04-06 ENCOUNTER — Telehealth: Payer: Self-pay | Admitting: Gastroenterology

## 2017-04-06 ENCOUNTER — Other Ambulatory Visit: Payer: Self-pay

## 2017-04-06 DIAGNOSIS — R1319 Other dysphagia: Secondary | ICD-10-CM

## 2017-04-06 DIAGNOSIS — R1310 Dysphagia, unspecified: Secondary | ICD-10-CM

## 2017-04-06 DIAGNOSIS — R12 Heartburn: Secondary | ICD-10-CM

## 2017-04-06 MED ORDER — OMEPRAZOLE 40 MG PO CPDR: 40.0000 mg | DELAYED_RELEASE_CAPSULE | Freq: Every day | ORAL | 0 refills | Status: DC

## 2017-04-06 NOTE — Telephone Encounter (Signed)
rx sent

## 2017-04-06 NOTE — Telephone Encounter (Signed)
*  STAT* If patient is at the pharmacy, call can be transferred to refill team.   1. Which medications need to be refilled? (please list name of each medication and dose if known) Omeprazole 40 mg  2. Which pharmacy/location (including street and city if local pharmacy) is medication to be sent to? [facility] [address] [place]  3. Do they need a 30 day or 90 day supply? 90 day

## 2017-05-16 ENCOUNTER — Other Ambulatory Visit: Payer: Self-pay | Admitting: Family Medicine

## 2017-05-16 DIAGNOSIS — Z1231 Encounter for screening mammogram for malignant neoplasm of breast: Secondary | ICD-10-CM

## 2017-06-09 ENCOUNTER — Other Ambulatory Visit: Payer: Self-pay | Admitting: Family Medicine

## 2017-06-09 DIAGNOSIS — E782 Mixed hyperlipidemia: Secondary | ICD-10-CM

## 2017-06-09 DIAGNOSIS — I10 Essential (primary) hypertension: Secondary | ICD-10-CM

## 2017-06-13 ENCOUNTER — Ambulatory Visit
Admission: RE | Admit: 2017-06-13 | Discharge: 2017-06-13 | Disposition: A | Discharge status: Home / Self Care | Payer: Medicare HMO | Source: Ambulatory Visit | Attending: Family Medicine | Admitting: Family Medicine

## 2017-06-13 DIAGNOSIS — Z1231 Encounter for screening mammogram for malignant neoplasm of breast: Secondary | ICD-10-CM | POA: Insufficient documentation

## 2017-07-01 ENCOUNTER — Other Ambulatory Visit: Payer: Self-pay | Admitting: Family Medicine

## 2017-07-01 DIAGNOSIS — E782 Mixed hyperlipidemia: Secondary | ICD-10-CM

## 2017-07-01 DIAGNOSIS — I10 Essential (primary) hypertension: Secondary | ICD-10-CM

## 2017-07-05 ENCOUNTER — Other Ambulatory Visit: Payer: Self-pay | Admitting: Family Medicine

## 2017-07-05 DIAGNOSIS — I10 Essential (primary) hypertension: Secondary | ICD-10-CM

## 2017-07-06 ENCOUNTER — Telehealth: Payer: Self-pay | Admitting: Gastroenterology

## 2017-07-06 NOTE — Telephone Encounter (Signed)
Pt is calling to get rx omeprazoledr 40 mg send to [facility]

## 2017-07-07 ENCOUNTER — Other Ambulatory Visit: Payer: Self-pay

## 2017-07-07 DIAGNOSIS — R1310 Dysphagia, unspecified: Secondary | ICD-10-CM

## 2017-07-07 DIAGNOSIS — R1319 Other dysphagia: Secondary | ICD-10-CM

## 2017-07-07 DIAGNOSIS — R12 Heartburn: Secondary | ICD-10-CM

## 2017-07-07 MED ORDER — OMEPRAZOLE 40 MG PO CPDR
40.0000 mg | DELAYED_RELEASE_CAPSULE | Freq: Every day | ORAL | 0 refills | Status: DC
Start: 2017-07-07 — End: 2017-09-29

## 2017-07-07 NOTE — Telephone Encounter (Signed)
Notified patient her rx has been sent to pharmacy

## 2017-07-10 ENCOUNTER — Other Ambulatory Visit: Payer: Self-pay | Admitting: Family Medicine

## 2017-07-10 DIAGNOSIS — I10 Essential (primary) hypertension: Secondary | ICD-10-CM

## 2017-07-11 ENCOUNTER — Ambulatory Visit (INDEPENDENT_AMBULATORY_CARE_PROVIDER_SITE_OTHER): Payer: Medicare HMO | Admitting: Family Medicine

## 2017-07-11 ENCOUNTER — Encounter: Payer: Self-pay | Admitting: Family Medicine

## 2017-07-11 VITALS — BP 120/80 | HR 64 | Ht 67.0 in | Wt 157.0 lb

## 2017-07-11 DIAGNOSIS — K219 Gastro-esophageal reflux disease without esophagitis: Secondary | ICD-10-CM | POA: Insufficient documentation

## 2017-07-11 DIAGNOSIS — I10 Essential (primary) hypertension: Secondary | ICD-10-CM | POA: Diagnosis not present

## 2017-07-11 DIAGNOSIS — B070 Plantar wart: Secondary | ICD-10-CM

## 2017-07-11 DIAGNOSIS — Z23 Encounter for immunization: Secondary | ICD-10-CM

## 2017-07-11 DIAGNOSIS — E785 Hyperlipidemia, unspecified: Secondary | ICD-10-CM

## 2017-07-11 DIAGNOSIS — L301 Dyshidrosis [pompholyx]: Secondary | ICD-10-CM | POA: Diagnosis not present

## 2017-07-11 HISTORY — DX: Gastro-esophageal reflux disease without esophagitis: K21.9

## 2017-07-11 MED ORDER — TRIAMCINOLONE ACETONIDE 0.1 % EX CREA: 1.0000 "application " | TOPICAL_CREAM | Freq: Two times a day (BID) | CUTANEOUS | 0 refills | Status: DC

## 2017-07-11 NOTE — Progress Notes (Signed)
Name: Taylor Griffin   MRN: [id number]    DOB: 05/22/1950   Date:07/11/2017       Progress Note  Subjective  Chief Complaint  Chief Complaint  Patient presents with  . Hypertension  . Hyperlipidemia  . Recurrent Skin Infections    L) foot- places that itch and are " blistered up"    Hypertension  This is a new problem. The current episode started more than 1 year ago. The problem is unchanged. The problem is controlled. Pertinent negatives include no anxiety, blurred vision, chest pain, headaches, malaise/fatigue, neck pain, orthopnea, palpitations, peripheral edema, PND, shortness of breath or sweats. There are no associated agents to hypertension. There are no known risk factors for coronary artery disease. Past treatments include beta blockers and angiotensin blockers. The current treatment provides moderate improvement. There are no compliance problems.  There is no history of angina, kidney disease, CAD/MI, CVA, heart failure, left ventricular hypertrophy, PVD or retinopathy. There is no history of chronic renal disease, a hypertension causing med or renovascular disease.  Gastroesophageal Reflux  She reports no abdominal pain, no belching, no chest pain, no choking, no coughing, no dysphagia, no early satiety, no globus sensation, no heartburn, no hoarse voice, no nausea, no sore throat, no stridor, no tooth decay, no water brash or no wheezing. This is a chronic problem. The current episode started more than 1 year ago. The problem occurs occasionally. The symptoms are aggravated by certain foods. Pertinent negatives include no anemia, fatigue, melena, muscle weakness, orthopnea or weight loss. There are no known risk factors. She has tried a PPI for the symptoms. The treatment provided moderate relief.  Hyperlipidemia  This is a chronic problem. The current episode started more than 1 year ago. The problem is controlled. Recent lipid tests were reviewed and are normal. She has no  history of chronic renal disease, diabetes, hypothyroidism, liver disease or nephrotic syndrome. Factors aggravating her hyperlipidemia include thiazides. Pertinent negatives include no chest pain, focal sensory loss, focal weakness, leg pain, myalgias or shortness of breath. Current antihyperlipidemic treatment includes statins.  Rash  The current episode started 1 to 4 weeks ago. The problem has been waxing and waning since onset. The affected locations include the right foot and left foot. The rash is characterized by itchiness. She was exposed to nothing. Pertinent negatives include no congestion, cough, diarrhea, fatigue, fever, joint pain, shortness of breath or sore throat.    No problem-specific Assessment & Plan notes found for this encounter.   Past Medical History:  Diagnosis Date  . Anemia   . Arthritis    WRIST AND FINGERS  . Hyperlipidemia   . Hypertension    CONTROLLED WITH MEDS    Past Surgical History:  Procedure Laterality Date  . COLONOSCOPY WITH PROPOFOL N/A 11/22/2014   Procedure: COLONOSCOPY WITH PROPOFOL;  Surgeon: Lucilla Lame, MD;  Location: [place];  Service: Endoscopy;  Laterality: N/A;  LATEX ALLERGY  . ECTOPIC PREGNANCY SURGERY    . ESOPHAGOGASTRODUODENOSCOPY (EGD) WITH PROPOFOL N/A 01/26/2017   Procedure: ESOPHAGOGASTRODUODENOSCOPY (EGD) WITH PROPOFOL;  Surgeon: Lin Landsman, MD;  Location: [place];  Service: Endoscopy;  Laterality: N/A;    Family History  Problem Relation Age of Onset  . Diabetes Mother   . Hypertension Mother   . Breast cancer Sister 46    Social History   Socioeconomic History  . Marital status: Married    Spouse name: Not on file  . Number of children: Not  on file  . Years of education: Not on file  . Highest education level: Not on file  Occupational History  . Not on file  Social Needs  . Financial resource strain: Not on file  . Food insecurity:    Worry: Not on file    Inability: Not on  file  . Transportation needs:    Medical: Not on file    Non-medical: Not on file  Tobacco Use  . Smoking status: Former Smoker    Packs/day: 0.50    Years: 18.00    Pack years: 9.00    Types: Cigarettes  . Smokeless tobacco: Never Used  Substance and Sexual Activity  . Alcohol use: No    Alcohol/week: 0.0 oz  . Drug use: No  . Sexual activity: Never  Lifestyle  . Physical activity:    Days per week: Not on file    Minutes per session: Not on file  . Stress: Not on file  Relationships  . Social connections:    Talks on phone: Not on file    Gets together: Not on file    Attends religious service: Not on file    Active member of club or organization: Not on file    Attends meetings of clubs or organizations: Not on file    Relationship status: Not on file  . Intimate partner violence:    Fear of current or ex partner: Not on file    Emotionally abused: Not on file    Physically abused: Not on file    Forced sexual activity: Not on file  Other Topics Concern  . Not on file  Social History Narrative  . Not on file    Allergies  Allergen Reactions  . Latex Itching    Outpatient Medications Prior to Visit  Medication Sig Dispense Refill  . aspirin 81 MG tablet Take 1 tablet (81 mg total) by mouth daily. AM 30 tablet 11  . folic acid (FOLVITE) 578 MCG tablet Take 1 tablet (800 mcg total) by mouth daily. Pt needs to take 1 whole pill, not the half- please d/c the half tablet 90 tablet 3  . hydrochlorothiazide (HYDRODIURIL) 12.5 MG tablet TAKE 1 TABLET BY MOUTH EVERY DAY 30 tablet 0  . losartan (COZAAR) 100 MG tablet TAKE 1 TABLET BY MOUTH EVERY DAY 30 tablet 0  . metoprolol succinate (TOPROL-XL) 50 MG 24 hr tablet TAKE 1 TABLET (50 MG TOTAL) BY MOUTH DAILY. TAKE WITH OR IMMEDIATELY FOLLOWING A MEAL. 30 tablet 0  . Multiple Vitamins-Minerals (MULTIVITAMIN WITH MINERALS) tablet Take 1 tablet by mouth daily.    Marland Kitchen omeprazole (PRILOSEC) 40 MG capsule Take 1 capsule (40 mg  total) by mouth daily. (Patient taking differently: Take 40 mg by mouth daily. otc) 90 capsule 0  . pravastatin (PRAVACHOL) 40 MG tablet TAKE 1 TABLET BY MOUTH EVERY DAY 30 tablet 0   No facility-administered medications prior to visit.     Review of Systems  Constitutional: Negative for chills, fatigue, fever, malaise/fatigue and weight loss.  HENT: Negative for congestion, ear discharge, ear pain, hoarse voice and sore throat.   Eyes: Negative for blurred vision.  Respiratory: Negative for cough, sputum production, choking, shortness of breath and wheezing.   Cardiovascular: Negative for chest pain, palpitations, orthopnea, leg swelling and PND.  Gastrointestinal: Negative for abdominal pain, blood in stool, constipation, diarrhea, dysphagia, heartburn, melena and nausea.  Genitourinary: Negative for dysuria, frequency, hematuria and urgency.  Musculoskeletal: Negative for back pain, joint pain,  myalgias, muscle weakness and neck pain.  Skin: Positive for rash.  Neurological: Negative for dizziness, tingling, sensory change, focal weakness and headaches.  Endo/Heme/Allergies: Negative for environmental allergies and polydipsia. Does not bruise/bleed easily.  Psychiatric/Behavioral: Negative for depression and suicidal ideas. The patient is not nervous/anxious and does not have insomnia.      Objective  Vitals:   07/11/17 1036  BP: 120/80  Pulse: 64  Weight: 157 lb (71.2 kg)  Height: 5\' 7"  (1.702 m)    Physical Exam  Constitutional: She is oriented to person, place, and time. She appears well-developed and well-nourished.  HENT:  Head: Normocephalic.  Right Ear: External ear normal.  Left Ear: External ear normal.  Mouth/Throat: Oropharynx is clear and moist.  Eyes: Pupils are equal, round, and reactive to light. Conjunctivae and EOM are normal. Lids are everted and swept, no foreign bodies found. Left eye exhibits no hordeolum. No foreign body present in the left eye. Right  conjunctiva is not injected. Left conjunctiva is not injected. No scleral icterus.  Neck: Normal range of motion. Neck supple. No JVD present. No tracheal deviation present. No thyromegaly present.  Cardiovascular: Normal rate, regular rhythm, normal heart sounds and intact distal pulses. Exam reveals no gallop and no friction rub.  No murmur heard. Pulmonary/Chest: Effort normal and breath sounds normal. No respiratory distress. She has no wheezes. She has no rales.  Abdominal: Soft. Bowel sounds are normal. She exhibits no mass. There is no hepatosplenomegaly. There is no tenderness. There is no rebound and no guarding.  Musculoskeletal: Normal range of motion. She exhibits no edema or tenderness.  Lymphadenopathy:    She has no cervical adenopathy.  Neurological: She is alert and oriented to person, place, and time. She has normal strength. She displays normal reflexes. No cranial nerve deficit.  Skin: Skin is warm. Rash noted. Rash is vesicular.  Left sole hyperkeratotic  X 2 years  Psychiatric: She has a normal mood and affect. Her mood appears not anxious. She does not exhibit a depressed mood.  Nursing note and vitals reviewed.     Assessment & Plan  Problem List Items Addressed This Visit      Cardiovascular and Mediastinum   Hypertension - Primary   Relevant Orders   Renal Function Panel     Digestive   Gastroesophageal reflux disease     Musculoskeletal and Integument   Dyshidrotic eczema   Relevant Medications   triamcinolone cream (KENALOG) 0.1 %     Other   Hyperlipidemia, unspecified   Relevant Orders   Lipid panel    Other Visit Diagnoses    Plantar wart       Relevant Orders   Ambulatory referral to Dermatology   Need for 23-polyvalent pneumococcal polysaccharide vaccine       Relevant Orders   Pneumococcal polysaccharide vaccine 23-valent greater than or equal to 2yo subcutaneous/IM (Completed)      Meds ordered this encounter  Medications  .  triamcinolone cream (KENALOG) 0.1 %    Sig: Apply 1 application topically 2 (two) times daily.    Dispense:  30 g    Refill:  0      Dr. Macon [facility]  07/11/17

## 2017-07-12 LAB — LIPID PANEL
CHOL/HDL RATIO: 5.2 ratio — AB (ref 0.0–4.4)
Cholesterol, Total: 225 mg/dL — ABNORMAL HIGH (ref 100–199)
HDL: 43 mg/dL (ref >39)
LDL CALC: 148 mg/dL — AB (ref 0–99)
Triglycerides: 168 mg/dL — ABNORMAL HIGH (ref 0–149)
VLDL CHOLESTEROL CAL: 34 mg/dL (ref 5–40)

## 2017-07-12 LAB — RENAL FUNCTION PANEL
ALBUMIN: 4.6 g/dL (ref 3.6–4.8)
BUN / CREAT RATIO: 16 (ref 12–28)
BUN: 19 mg/dL (ref 8–27)
CHLORIDE: 101 mmol/L (ref 96–106)
CO2: 24 mmol/L (ref 20–29)
Calcium: 9.9 mg/dL (ref 8.7–10.3)
Creatinine, Ser: 1.16 mg/dL — ABNORMAL HIGH (ref 0.57–1.00)
GFR, EST AFRICAN AMERICAN: 56 mL/min/{1.73_m2} — AB (ref >59)
GFR, EST NON AFRICAN AMERICAN: 48 mL/min/{1.73_m2} — AB (ref >59)
GLUCOSE: 90 mg/dL (ref 65–99)
Phosphorus: 3.8 mg/dL (ref 2.5–4.5)
Potassium: 4.4 mmol/L (ref 3.5–5.2)
Sodium: 141 mmol/L (ref 134–144)

## 2017-07-18 DIAGNOSIS — L84 Corns and callosities: Secondary | ICD-10-CM | POA: Diagnosis not present

## 2017-07-28 ENCOUNTER — Other Ambulatory Visit: Payer: Self-pay | Admitting: Family Medicine

## 2017-07-28 DIAGNOSIS — I10 Essential (primary) hypertension: Secondary | ICD-10-CM

## 2017-08-02 ENCOUNTER — Other Ambulatory Visit: Payer: Self-pay | Admitting: Family Medicine

## 2017-08-02 DIAGNOSIS — E782 Mixed hyperlipidemia: Secondary | ICD-10-CM

## 2017-08-02 DIAGNOSIS — I10 Essential (primary) hypertension: Secondary | ICD-10-CM

## 2017-08-06 ENCOUNTER — Other Ambulatory Visit: Payer: Self-pay | Admitting: Family Medicine

## 2017-08-06 DIAGNOSIS — E782 Mixed hyperlipidemia: Secondary | ICD-10-CM

## 2017-08-06 DIAGNOSIS — I10 Essential (primary) hypertension: Secondary | ICD-10-CM

## 2017-08-27 ENCOUNTER — Other Ambulatory Visit: Payer: Self-pay | Admitting: Family Medicine

## 2017-08-27 DIAGNOSIS — L301 Dyshidrosis [pompholyx]: Secondary | ICD-10-CM

## 2017-08-27 DIAGNOSIS — I10 Essential (primary) hypertension: Secondary | ICD-10-CM

## 2017-08-27 DIAGNOSIS — E782 Mixed hyperlipidemia: Secondary | ICD-10-CM

## 2017-08-30 ENCOUNTER — Other Ambulatory Visit: Payer: Self-pay | Admitting: Family Medicine

## 2017-08-30 ENCOUNTER — Other Ambulatory Visit: Payer: Self-pay

## 2017-08-30 DIAGNOSIS — E782 Mixed hyperlipidemia: Secondary | ICD-10-CM

## 2017-08-30 DIAGNOSIS — D529 Folate deficiency anemia, unspecified: Secondary | ICD-10-CM

## 2017-08-30 DIAGNOSIS — I10 Essential (primary) hypertension: Secondary | ICD-10-CM

## 2017-08-30 MED ORDER — LOSARTAN POTASSIUM 100 MG PO TABS: 100.0000 mg | ORAL_TABLET | Freq: Every day | ORAL | 1 refills | Status: DC

## 2017-08-30 MED ORDER — METOPROLOL SUCCINATE ER 50 MG PO TB24: 50.0000 mg | ORAL_TABLET | Freq: Every day | ORAL | 1 refills | Status: DC

## 2017-08-30 MED ORDER — PRAVASTATIN SODIUM 40 MG PO TABS: 40.0000 mg | ORAL_TABLET | Freq: Every day | ORAL | 1 refills | Status: DC

## 2017-08-30 MED ORDER — FOLIC ACID 800 MCG PO TABS: 800.0000 ug | ORAL_TABLET | Freq: Every day | ORAL | 1 refills | Status: DC

## 2017-08-31 ENCOUNTER — Other Ambulatory Visit: Payer: Self-pay | Admitting: Family Medicine

## 2017-08-31 DIAGNOSIS — I10 Essential (primary) hypertension: Secondary | ICD-10-CM

## 2017-09-29 ENCOUNTER — Other Ambulatory Visit: Payer: Self-pay | Admitting: Gastroenterology

## 2017-09-29 DIAGNOSIS — R1310 Dysphagia, unspecified: Secondary | ICD-10-CM

## 2017-09-29 DIAGNOSIS — R12 Heartburn: Secondary | ICD-10-CM

## 2017-09-29 DIAGNOSIS — R1319 Other dysphagia: Secondary | ICD-10-CM

## 2017-10-05 ENCOUNTER — Telehealth: Payer: Self-pay | Admitting: Gastroenterology

## 2017-10-05 NOTE — Telephone Encounter (Signed)
Patient stopped by to get a refill on Omeprazole 40 mg. It's working for her and she would like to have it with refills if possible. [facility] [place]

## 2017-10-06 NOTE — Telephone Encounter (Signed)
Medication was just filled for 90 days on 09/29/17, to soon for refill

## 2017-10-10 NOTE — Telephone Encounter (Signed)
Patient stopped by again about refill on medication omeprazole. Patient has been out for a week, and I called the pharmacy and they stated the medication was not there. Patient requesting prescription be sent again with refills if possible.

## 2017-10-11 NOTE — Telephone Encounter (Signed)
Medication was called into Pharmacy on 10/07/16, pt has been notified

## 2017-11-08 ENCOUNTER — Other Ambulatory Visit: Payer: Self-pay | Admitting: Family Medicine

## 2017-11-08 DIAGNOSIS — L301 Dyshidrosis [pompholyx]: Secondary | ICD-10-CM

## 2017-12-31 ENCOUNTER — Other Ambulatory Visit: Payer: Self-pay | Admitting: Gastroenterology

## 2017-12-31 DIAGNOSIS — R12 Heartburn: Secondary | ICD-10-CM

## 2017-12-31 DIAGNOSIS — R1319 Other dysphagia: Secondary | ICD-10-CM

## 2017-12-31 DIAGNOSIS — R1310 Dysphagia, unspecified: Secondary | ICD-10-CM

## 2018-01-11 ENCOUNTER — Encounter: Payer: Self-pay | Admitting: Family Medicine

## 2018-01-11 ENCOUNTER — Telehealth: Payer: Self-pay | Admitting: Gastroenterology

## 2018-01-11 ENCOUNTER — Ambulatory Visit (INDEPENDENT_AMBULATORY_CARE_PROVIDER_SITE_OTHER): Payer: Medicare HMO | Admitting: Family Medicine

## 2018-01-11 VITALS — BP 130/100 | HR 68 | Ht 67.0 in | Wt 162.0 lb

## 2018-01-11 DIAGNOSIS — I10 Essential (primary) hypertension: Secondary | ICD-10-CM

## 2018-01-11 DIAGNOSIS — E782 Mixed hyperlipidemia: Secondary | ICD-10-CM | POA: Diagnosis not present

## 2018-01-11 DIAGNOSIS — R0602 Shortness of breath: Secondary | ICD-10-CM | POA: Diagnosis not present

## 2018-01-11 DIAGNOSIS — Z23 Encounter for immunization: Secondary | ICD-10-CM

## 2018-01-11 MED ORDER — HYDROCHLOROTHIAZIDE 25 MG PO TABS: 25.0000 mg | ORAL_TABLET | Freq: Every day | ORAL | 3 refills | Status: DC

## 2018-01-11 MED ORDER — METOPROLOL SUCCINATE ER 50 MG PO TB24: 50.0000 mg | ORAL_TABLET | Freq: Every day | ORAL | 1 refills | Status: DC

## 2018-01-11 MED ORDER — PRAVASTATIN SODIUM 40 MG PO TABS: 40.0000 mg | ORAL_TABLET | Freq: Every day | ORAL | 1 refills | Status: DC

## 2018-01-11 MED ORDER — LOSARTAN POTASSIUM 100 MG PO TABS: 100.0000 mg | ORAL_TABLET | Freq: Every day | ORAL | 1 refills | Status: DC

## 2018-01-11 NOTE — Progress Notes (Signed)
Date:  01/11/2018   Name:  Taylor Griffin   DOB:  18-May-1950   MRN:  [id number]   Chief Complaint: Hypertension; Hyperlipidemia; Shortness of Breath (on exertion- getting dressed, etc); and Flu Vaccine Hypertension  This is a chronic problem. The current episode started more than 1 year ago. The problem has been gradually worsening since onset. The problem is uncontrolled. Associated symptoms include shortness of breath. Pertinent negatives include no anxiety, blurred vision, chest pain, headaches, malaise/fatigue, neck pain, orthopnea, palpitations, peripheral edema, PND or sweats. There are no associated agents to hypertension. Past treatments include angiotensin blockers, beta blockers and diuretics. The current treatment provides moderate improvement. There are no compliance problems.  There is no history of angina, kidney disease, CAD/MI, CVA, heart failure, left ventricular hypertrophy, PVD or retinopathy. There is no history of chronic renal disease, a hypertension causing med or renovascular disease.  Hyperlipidemia  This is a chronic problem. The current episode started more than 1 year ago. The problem is controlled. Recent lipid tests were reviewed and are normal. She has no history of chronic renal disease. Factors aggravating her hyperlipidemia include thiazides. Associated symptoms include shortness of breath. Pertinent negatives include no chest pain, focal sensory loss, focal weakness, leg pain or myalgias. Current antihyperlipidemic treatment includes statins. The current treatment provides moderate improvement of lipids. There are no compliance problems.  Risk factors for coronary artery disease include dyslipidemia and hypertension.  Shortness of Breath  This is a new problem. The current episode started more than 1 month ago ("at least 2 months"). The problem occurs daily. The problem has been waxing and waning. Pertinent negatives include no abdominal pain, chest pain,  claudication, coryza, ear pain, fever, headaches, hemoptysis, leg pain, leg swelling, neck pain, orthopnea, PND, rash, rhinorrhea, sore throat, sputum production, swollen glands, syncope, vomiting or wheezing. Nothing aggravates the symptoms. The patient has no known risk factors for DVT/PE. The treatment provided moderate relief. There is no history of allergies, asthma, bronchiolitis, CAD, chronic lung disease, DVT, a heart failure or PE.     Review of Systems  Constitutional: Negative.  Negative for chills, fatigue, fever, malaise/fatigue and unexpected weight change.  HENT: Negative for congestion, ear discharge, ear pain, rhinorrhea, sinus pressure, sneezing and sore throat.   Eyes: Negative for blurred vision, photophobia, pain, discharge, redness and itching.  Respiratory: Positive for shortness of breath. Negative for cough, hemoptysis, sputum production, wheezing and stridor.   Cardiovascular: Negative for chest pain, palpitations, orthopnea, claudication, leg swelling, syncope and PND.  Gastrointestinal: Negative for abdominal pain, blood in stool, constipation, diarrhea, nausea and vomiting.  Endocrine: Negative for cold intolerance, heat intolerance, polydipsia, polyphagia and polyuria.  Genitourinary: Negative for dysuria, flank pain, frequency, hematuria, menstrual problem, pelvic pain, urgency, vaginal bleeding and vaginal discharge.  Musculoskeletal: Negative for arthralgias, back pain, myalgias and neck pain.  Skin: Negative for rash.  Allergic/Immunologic: Negative for environmental allergies and food allergies.  Neurological: Negative for dizziness, focal weakness, weakness, light-headedness, numbness and headaches.  Hematological: Negative for adenopathy. Does not bruise/bleed easily.  Psychiatric/Behavioral: Negative for dysphoric mood. The patient is not nervous/anxious.     Patient Active Problem List   Diagnosis Date Noted  . Dyshidrotic eczema 07/11/2017  .  Gastroesophageal reflux disease 07/11/2017  . Esophageal dysphagia   . Heart burn   . Hyperlipidemia, unspecified 05/03/2016  . Hypertension 05/03/2016  . Anemia due to folic acid deficiency 18/56/3149  . Special screening for malignant neoplasms, colon   .  Benign neoplasm of ascending colon   . Benign neoplasm of descending colon   . Benign neoplasm of sigmoid colon     Allergies  Allergen Reactions  . Latex Itching    Past Surgical History:  Procedure Laterality Date  . COLONOSCOPY WITH PROPOFOL N/A 11/22/2014   Procedure: COLONOSCOPY WITH PROPOFOL;  Surgeon: Lucilla Lame, MD;  Location: [facility];  Service: Endoscopy;  Laterality: N/A;  LATEX ALLERGY  . ECTOPIC PREGNANCY SURGERY    . ESOPHAGOGASTRODUODENOSCOPY (EGD) WITH PROPOFOL N/A 01/26/2017   Procedure: ESOPHAGOGASTRODUODENOSCOPY (EGD) WITH PROPOFOL;  Surgeon: Lin Landsman, MD;  Location: [facility];  Service: Endoscopy;  Laterality: N/A;    Social History   Tobacco Use  . Smoking status: Former Smoker    Packs/day: 0.50    Years: 18.00    Pack years: 9.00    Types: Cigarettes  . Smokeless tobacco: Never Used  Substance Use Topics  . Alcohol use: No    Alcohol/week: 0.0 standard drinks  . Drug use: No     Medication list has been reviewed and updated.  Current Meds  Medication Sig  . aspirin 81 MG tablet Take 1 tablet (81 mg total) by mouth daily. AM  . folic acid (FOLVITE) 081 MCG tablet Take 1 tablet (800 mcg total) by mouth daily. Pt needs to take 1 whole pill, not the half- please d/c the half tablet  . losartan (COZAAR) 100 MG tablet Take 1 tablet (100 mg total) by mouth daily.  . metoprolol succinate (TOPROL-XL) 50 MG 24 hr tablet Take 1 tablet (50 mg total) by mouth daily. Take with or immediately following a meal.  . Multiple Vitamins-Minerals (MULTIVITAMIN WITH MINERALS) tablet Take 1 tablet by mouth daily.  Marland Kitchen omeprazole (PRILOSEC) 40 MG capsule TAKE 1 CAPSULE BY MOUTH EVERY  DAY  . pravastatin (PRAVACHOL) 40 MG tablet Take 1 tablet (40 mg total) by mouth daily.  . [DISCONTINUED] hydrochlorothiazide (HYDRODIURIL) 12.5 MG tablet TAKE 1 TABLET BY MOUTH EVERY DAY  . [DISCONTINUED] losartan (COZAAR) 100 MG tablet Take 1 tablet (100 mg total) by mouth daily.  . [DISCONTINUED] metoprolol succinate (TOPROL-XL) 50 MG 24 hr tablet Take 1 tablet (50 mg total) by mouth daily. Take with or immediately following a meal.  . [DISCONTINUED] pravastatin (PRAVACHOL) 40 MG tablet Take 1 tablet (40 mg total) by mouth daily.    PHQ 2/9 Scores 01/11/2018 07/11/2017 05/05/2015 10/29/2014  PHQ - 2 Score 0 0 0 0  PHQ- 9 Score 0 - - -    Physical Exam  Constitutional: No distress.  HENT:  Head: Normocephalic and atraumatic.  Right Ear: External ear normal.  Left Ear: External ear normal.  Nose: Nose normal.  Mouth/Throat: Oropharynx is clear and moist.  Eyes: Pupils are equal, round, and reactive to light. Conjunctivae and EOM are normal. Right eye exhibits no discharge. Left eye exhibits no discharge.  Neck: Normal range of motion. Neck supple. No JVD present. No thyromegaly present.  Cardiovascular: Normal rate, regular rhythm, normal heart sounds and intact distal pulses. Exam reveals no gallop and no friction rub.  No murmur heard. Pulmonary/Chest: Effort normal and breath sounds normal. She has no decreased breath sounds. She has no wheezes. She has no rhonchi. She has no rales.  Abdominal: Soft. Bowel sounds are normal. She exhibits no mass. There is no tenderness. There is no guarding.  Musculoskeletal: Normal range of motion. She exhibits no edema.  Lymphadenopathy:    She has no cervical adenopathy.  Neurological: She is alert. She has normal reflexes.  Skin: Skin is warm and dry. She is not diaphoretic.  Nursing note and vitals reviewed.   BP (!) 130/100   Pulse 68   Ht 5\' 7"  (1.702 m)   Wt 162 lb (73.5 kg)   SpO2 97%   BMI 25.37 kg/m   Assessment and Plan:  1.  Essential hypertension Stable on med- refill metoprolol and losartan- recheck b/p in 4 weeks - metoprolol succinate (TOPROL-XL) 50 MG 24 hr tablet; Take 1 tablet (50 mg total) by mouth daily. Take with or immediately following a meal.  Dispense: 90 tablet; Refill: 1 - losartan (COZAAR) 100 MG tablet; Take 1 tablet (100 mg total) by mouth daily.  Dispense: 90 tablet; Refill: 1  2. Mixed hyperlipidemia Stable on med- refill pravastatin - pravastatin (PRAVACHOL) 40 MG tablet; Take 1 tablet (40 mg total) by mouth daily.  Dispense: 90 tablet; Refill: 1  3. Shortness of breath Ordered chest xray/ refer to pulmonology for further lung testing - DG Chest 2 View; Future - Ambulatory referral to Pulmonology  4. Flu vaccine need administered - Flu vaccine HIGH DOSE PF (Fluzone High dose)   Dr. [facility]  01/11/2018

## 2018-01-11 NOTE — Telephone Encounter (Signed)
Patient called and wants a refill on Omeprazole DR 40mg  called into [facility] on [address]([phone])last seen by Dr Marius Ditch [phone]. She saw Dr Ronnald Ramp for refill for other medications & she gave her a 30 day supply,this will only get her through the mid of December. Patient has an appointment for 03-13-2018 may she get another refill to last until her appointment. Please advise.

## 2018-01-11 NOTE — Telephone Encounter (Signed)
Patient will need to schedule an appt she has not been seen in office since 2018 which now makes a year.

## 2018-01-12 NOTE — Telephone Encounter (Signed)
I spoke with patient about no being able to obtain refill until her appointment in January 2020. She will call to Dr Stevenson Clinch office & see if she will prescribe meds until appointment.

## 2018-01-13 ENCOUNTER — Other Ambulatory Visit: Payer: Self-pay

## 2018-01-13 DIAGNOSIS — R1319 Other dysphagia: Secondary | ICD-10-CM

## 2018-01-13 DIAGNOSIS — R1310 Dysphagia, unspecified: Secondary | ICD-10-CM

## 2018-01-13 DIAGNOSIS — R12 Heartburn: Secondary | ICD-10-CM

## 2018-01-13 MED ORDER — OMEPRAZOLE 20 MG PO CPDR: 20.0000 mg | DELAYED_RELEASE_CAPSULE | Freq: Every day | ORAL | 0 refills | Status: DC

## 2018-01-16 ENCOUNTER — Other Ambulatory Visit: Payer: Self-pay | Admitting: Pulmonary Disease

## 2018-01-16 DIAGNOSIS — J449 Chronic obstructive pulmonary disease, unspecified: Secondary | ICD-10-CM | POA: Diagnosis not present

## 2018-01-16 DIAGNOSIS — R0609 Other forms of dyspnea: Secondary | ICD-10-CM | POA: Diagnosis not present

## 2018-01-16 DIAGNOSIS — R0600 Dyspnea, unspecified: Secondary | ICD-10-CM

## 2018-01-27 ENCOUNTER — Telehealth: Payer: Self-pay | Admitting: *Deleted

## 2018-01-27 NOTE — Telephone Encounter (Signed)
Received referral for initial lung cancer screening scan. Contacted patient and obtained smoking history, <0.25 packs per day x 48 years with quit date 2016. Patient understands that she does not meet 30 pack year history requirement for lung cancer screening. She will proceed with chest xray that Dr. Otilio Miu has ordered as she feels that she may be having worsening COPD. Encouraged to discuss further with her providers as she may need a non contrast CT that is not a part of the lung screening program. Patient verbalizes understanding.

## 2018-01-30 ENCOUNTER — Ambulatory Visit: Admission: RE | Admit: 2018-01-30 | Payer: Medicare HMO | Source: Ambulatory Visit

## 2018-02-17 ENCOUNTER — Other Ambulatory Visit: Payer: Self-pay | Admitting: Family Medicine

## 2018-02-17 DIAGNOSIS — D529 Folate deficiency anemia, unspecified: Secondary | ICD-10-CM

## 2018-02-20 ENCOUNTER — Ambulatory Visit
Admission: RE | Admit: 2018-02-20 | Discharge: 2018-02-20 | Disposition: A | Discharge status: Home / Self Care | Payer: Medicare HMO | Source: Ambulatory Visit | Attending: Family Medicine | Admitting: Family Medicine

## 2018-02-20 ENCOUNTER — Encounter: Payer: Self-pay | Admitting: Family Medicine

## 2018-02-20 ENCOUNTER — Ambulatory Visit
Admission: RE | Admit: 2018-02-20 | Discharge: 2018-02-20 | Disposition: A | Discharge status: Home / Self Care | Payer: Medicare HMO | Attending: Family Medicine | Admitting: Family Medicine

## 2018-02-20 ENCOUNTER — Ambulatory Visit (INDEPENDENT_AMBULATORY_CARE_PROVIDER_SITE_OTHER): Payer: Medicare HMO | Admitting: Family Medicine

## 2018-02-20 VITALS — BP 120/60 | HR 64 | Ht 67.0 in | Wt 165.0 lb

## 2018-02-20 DIAGNOSIS — J432 Centrilobular emphysema: Secondary | ICD-10-CM

## 2018-02-20 DIAGNOSIS — R0609 Other forms of dyspnea: Secondary | ICD-10-CM

## 2018-02-20 DIAGNOSIS — R0600 Dyspnea, unspecified: Secondary | ICD-10-CM

## 2018-02-20 DIAGNOSIS — R0602 Shortness of breath: Secondary | ICD-10-CM | POA: Diagnosis not present

## 2018-02-20 DIAGNOSIS — I10 Essential (primary) hypertension: Secondary | ICD-10-CM

## 2018-02-20 MED ORDER — HYDROCHLOROTHIAZIDE 25 MG PO TABS: 25.0000 mg | ORAL_TABLET | Freq: Every day | ORAL | 3 refills | Status: DC

## 2018-02-20 NOTE — Progress Notes (Signed)
Date:  02/20/2018   Name:  Taylor Griffin   DOB:  [birth year]   MRN:  [id number]   Chief Complaint: Follow-up (SOB- saw Dr A (pulm)- started her on Anoro also ordered a CT of lung. Has not had chest xray or lung scan yet)  Shortness of Breath  This is a chronic problem. The current episode started more than 1 year ago. The problem occurs intermittently. The problem has been gradually improving. Associated symptoms include orthopnea and PND. Pertinent negatives include no abdominal pain, chest pain, claudication, coryza, ear pain, fever, headaches, hemoptysis, leg pain, leg swelling, neck pain, rash, rhinorrhea, sore throat, sputum production, swollen glands, syncope, vomiting or wheezing. The symptoms are aggravated by odors. She has tried beta agonist inhalers and ipratropium inhalers for the symptoms. The treatment provided moderate relief. Her past medical history is significant for allergies and COPD.  Hypertension  This is a chronic problem. The current episode started more than 1 year ago. The problem has been gradually improving since onset. The problem is controlled. Associated symptoms include orthopnea, PND and shortness of breath. Pertinent negatives include no anxiety, chest pain, headaches, neck pain, palpitations or peripheral edema. Past treatments include diuretics, beta blockers and angiotensin blockers. The current treatment provides moderate improvement.    Review of Systems  Constitutional: Negative for chills, fatigue, fever and unexpected weight change.  HENT: Negative for ear pain, hearing loss, nosebleeds, rhinorrhea, sneezing, sore throat and trouble swallowing.   Eyes: Negative for photophobia, pain, redness, itching and visual disturbance.  Respiratory: Positive for shortness of breath. Negative for cough, hemoptysis, sputum production, chest tightness and wheezing.   Cardiovascular: Positive for orthopnea and PND. Negative for chest pain, palpitations,  claudication, leg swelling and syncope.  Gastrointestinal: Negative for abdominal pain, blood in stool, constipation, diarrhea, nausea, rectal pain and vomiting.  Endocrine: Negative for cold intolerance, heat intolerance, polydipsia, polyphagia and polyuria.  Genitourinary: Negative for difficulty urinating, dysuria, flank pain, hematuria, menstrual problem, pelvic pain, vaginal bleeding and vaginal discharge.  Musculoskeletal: Negative for back pain, joint swelling, neck pain and neck stiffness.  Skin: Negative for color change and rash.  Allergic/Immunologic: Negative for immunocompromised state.  Neurological: Negative for dizziness, tremors, seizures, syncope, facial asymmetry, speech difficulty, weakness, light-headedness, numbness and headaches.  Hematological: Does not bruise/bleed easily.  Psychiatric/Behavioral: Negative for agitation, behavioral problems, confusion, hallucinations, self-injury, sleep disturbance and suicidal ideas. The patient is not nervous/anxious.     Patient Active Problem List   Diagnosis Date Noted  . Dyshidrotic eczema 07/11/2017  . Gastroesophageal reflux disease 07/11/2017  . Esophageal dysphagia   . Heart burn   . Hyperlipidemia, unspecified 05/03/2016  . Hypertension 05/03/2016  . Anemia due to folic acid deficiency 83/41/9622  . Special screening for malignant neoplasms, colon   . Benign neoplasm of ascending colon   . Benign neoplasm of descending colon   . Benign neoplasm of sigmoid colon     Allergies  Allergen Reactions  . Latex Itching    Past Surgical History:  Procedure Laterality Date  . COLONOSCOPY WITH PROPOFOL N/A 11/22/2014   Procedure: COLONOSCOPY WITH PROPOFOL;  Surgeon: Lucilla Lame, MD;  Location: [facility];  Service: Endoscopy;  Laterality: N/A;  LATEX ALLERGY  . ECTOPIC PREGNANCY SURGERY    . ESOPHAGOGASTRODUODENOSCOPY (EGD) WITH PROPOFOL N/A 01/26/2017   Procedure: ESOPHAGOGASTRODUODENOSCOPY (EGD) WITH PROPOFOL;   Surgeon: Lin Landsman, MD;  Location: [facility];  Service: Endoscopy;  Laterality: N/A;    Social History  Tobacco Use  . Smoking status: Former Smoker    Packs/day: 0.50    Years: 18.00    Pack years: 9.00    Types: Cigarettes  . Smokeless tobacco: Never Used  Substance Use Topics  . Alcohol use: No    Alcohol/week: 0.0 standard drinks  . Drug use: No     Medication list has been reviewed and updated.  Current Meds  Medication Sig  . aspirin 81 MG tablet Take 1 tablet (81 mg total) by mouth daily. AM  . [facility] FOLIC ACID 062 MCG tablet TAKE 1 TABLET DAILY. PT NEEDS TO TAKE 1 WHOLE PILL, NOT THE HALF- PLEASE D/C THE HALF TABLET  . fluticasone (FLONASE) 50 MCG/ACT nasal spray Place into the nose. Dr A  . hydrochlorothiazide (HYDRODIURIL) 25 MG tablet Take 1 tablet (25 mg total) by mouth daily.  Marland Kitchen losartan (COZAAR) 100 MG tablet Take 1 tablet (100 mg total) by mouth daily.  . metoprolol succinate (TOPROL-XL) 50 MG 24 hr tablet Take 1 tablet (50 mg total) by mouth daily. Take with or immediately following a meal.  . Multiple Vitamins-Minerals (MULTIVITAMIN WITH MINERALS) tablet Take 1 tablet by mouth daily.  Marland Kitchen omeprazole (PRILOSEC) 20 MG capsule Take 1 capsule (20 mg total) by mouth daily.  . pravastatin (PRAVACHOL) 40 MG tablet Take 1 tablet (40 mg total) by mouth daily.  Marland Kitchen umeclidinium-vilanterol (ANORO ELLIPTA) 62.5-25 MCG/INH AEPB Inhale into the lungs. Dr A  . [DISCONTINUED] hydrochlorothiazide (HYDRODIURIL) 25 MG tablet Take 1 tablet (25 mg total) by mouth daily.    PHQ 2/9 Scores 02/20/2018 01/11/2018 07/11/2017 05/05/2015  PHQ - 2 Score 0 0 0 0  PHQ- 9 Score 0 0 - -    Physical Exam Vitals signs and nursing note reviewed.  Constitutional:      General: She is not in acute distress.    Appearance: She is not diaphoretic.  HENT:     Head: Normocephalic and atraumatic.     Right Ear: External ear normal.     Left Ear: External ear normal.     Nose: Nose  normal.  Eyes:     General:        Right eye: No discharge.        Left eye: No discharge.     Conjunctiva/sclera: Conjunctivae normal.     Pupils: Pupils are equal, round, and reactive to light.  Neck:     Musculoskeletal: Normal range of motion and neck supple.     Thyroid: No thyromegaly.     Vascular: No JVD.  Cardiovascular:     Rate and Rhythm: Normal rate and regular rhythm.     Pulses: No decreased pulses.     Heart sounds: Normal heart sounds, S1 normal and S2 normal. No murmur. No systolic murmur. No diastolic murmur. No friction rub. No gallop. No S3 or S4 sounds.   Pulmonary:     Effort: Pulmonary effort is normal.     Breath sounds: Normal breath sounds. No decreased breath sounds, wheezing, rhonchi or rales.  Abdominal:     General: Bowel sounds are normal.     Palpations: Abdomen is soft. There is no mass.     Tenderness: There is no abdominal tenderness. There is no guarding.  Musculoskeletal: Normal range of motion.  Lymphadenopathy:     Cervical: No cervical adenopathy.  Skin:    General: Skin is warm and dry.  Neurological:     Mental Status: She is alert.  Deep Tendon Reflexes: Reflexes are normal and symmetric.     BP 120/60   Pulse 64   Ht 5\' 7"  (1.702 m)   Wt 165 lb (74.8 kg)   BMI 25.84 kg/m   Assessment and Plan: 1. Essential hypertension Chronic, stable- refill HCTZ 25 mg- blood pressure well controlled on med - hydrochlorothiazide (HYDRODIURIL) 25 MG tablet; Take 1 tablet (25 mg total) by mouth daily.  Dispense: 90 tablet; Refill: 3  2. Centrilobular emphysema ([place]) Order chest xray due to past history of smoking and patient "not qualifying for lung scan" due to radiology - DG Chest 2 View; Future  3. DOE (dyspnea on exertion) Patient was seen by pulmonology and started on Anoro. Obtain chest xray for evaluation. DOE is improved - DG Chest 2 View; Future

## 2018-03-09 ENCOUNTER — Other Ambulatory Visit: Payer: Self-pay | Admitting: Family Medicine

## 2018-03-09 DIAGNOSIS — E782 Mixed hyperlipidemia: Secondary | ICD-10-CM

## 2018-03-09 DIAGNOSIS — I10 Essential (primary) hypertension: Secondary | ICD-10-CM

## 2018-03-13 ENCOUNTER — Other Ambulatory Visit: Payer: Self-pay

## 2018-03-13 ENCOUNTER — Ambulatory Visit: Payer: Medicare HMO | Admitting: Gastroenterology

## 2018-03-13 ENCOUNTER — Encounter: Payer: Self-pay | Admitting: Gastroenterology

## 2018-03-13 VITALS — BP 116/78 | HR 81 | Resp 17 | Ht 67.0 in | Wt 160.4 lb

## 2018-03-13 DIAGNOSIS — R1030 Lower abdominal pain, unspecified: Secondary | ICD-10-CM | POA: Diagnosis not present

## 2018-03-13 DIAGNOSIS — R1310 Dysphagia, unspecified: Secondary | ICD-10-CM | POA: Diagnosis not present

## 2018-03-13 DIAGNOSIS — R1319 Other dysphagia: Secondary | ICD-10-CM

## 2018-03-13 NOTE — Progress Notes (Signed)
Taylor Darby, MD [address]  [place]  [place], [place] [address]  Main: [phone]  Fax: [phone]    Gastroenterology Consultation  Referring Provider:     Juline Patch, MD Primary Care Physician:  Juline Patch, MD Primary Gastroenterologist:  Dr. Cephas Griffin Reason for Consultation:     Dysphagia        HPI:   Taylor Griffin is a 68 y.o. y/o female referred by Dr. Juline Patch, MD  for consultation & management of dysphagia. Chronic dysphagia, "sensation of something stuck" since 2011. X-ray UGI at that time was unremarkable. Symptoms are frequent in last few weeks, most of days in a week, only with solids, often preceded by hiccups, did not lose weight, but a/w retrosternal burning pain and regurgiattion. Notices mostly with fried chicken. Denies food impaction. Bloating with milk products. Denies episodes of food impaction Did not try antacids so far Ex-smoker, quit 1 yr ago, 30+ yrs No GI surgeries in the past  Follow-up visit 03/13/2018 Patient is here for follow-up after 1 year.  Patient underwent EGD with esophageal biopsies for dysphagia and it was unremarkable.  She continued omeprazole 40 mg daily with resolution of her symptoms.  Recently, her PCP changed it to omeprazole 20 mg daily and plan to have her follow-up with GI.  Patient denies heartburn or regurgitation.  She reports having, low-grade, mild abdominal discomfort, below the umbilicus for the last 1 year.  She denies any particular relation to food.  There is no radiation of pain and no other associated symptoms.  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  She reports having similar pain in 2018 for which she underwent ultrasound abdomen which was unremarkable.  Patient gained about 10 pounds within last few months, she states she likes eating cheesecake.  GI Procedures: Colonoscopy 2016 by Dr Allen Norris, found to have polyps. Due  in 2021  EGD 01/26/17 - Normal duodenal bulb and second portion of the duodenum. - Normal stomach. - Esophagogastric landmarks identified. - Widely patent and non-obstructing Schatzki ring. - Normal esophagus. Biopsied.  Denies fam h/o GI malignancy  Past Medical History:  Diagnosis Date  . Anemia   . Arthritis    WRIST AND FINGERS  . Hyperlipidemia   . Hypertension    CONTROLLED WITH MEDS    Past Surgical History:  Procedure Laterality Date  . COLONOSCOPY WITH PROPOFOL N/A 11/22/2014   Procedure: COLONOSCOPY WITH PROPOFOL;  Surgeon: Lucilla Lame, MD;  Location: [facility];  Service: Endoscopy;  Laterality: N/A;  LATEX ALLERGY  . ECTOPIC PREGNANCY SURGERY    . ESOPHAGOGASTRODUODENOSCOPY (EGD) WITH PROPOFOL N/A 01/26/2017   Procedure: ESOPHAGOGASTRODUODENOSCOPY (EGD) WITH PROPOFOL;  Surgeon: Lin Landsman, MD;  Location: [facility];  Service: Endoscopy;  Laterality: N/A;    Current Outpatient Medications:  .  aspirin 81 MG tablet, Take 1 tablet (81 mg total) by mouth daily. AM, Disp: 30 tablet, Rfl: 11 .  [employer] FOLIC ACID 737 MCG tablet, TAKE 1 TABLET DAILY. PT NEEDS TO TAKE 1 WHOLE PILL, NOT THE HALF- PLEASE D/C THE HALF TABLET, Disp: 90 tablet, Rfl: 0 .  fluticasone (FLONASE) 50 MCG/ACT nasal spray, Place into the nose. Dr A, Disp: , Rfl:  .  hydrochlorothiazide (HYDRODIURIL) 25 MG tablet, Take 1 tablet (25 mg total) by mouth daily., Disp: 90 tablet, Rfl: 3 .  losartan (COZAAR) 100 MG tablet, Take 1 tablet (  100 mg total) by mouth daily., Disp: 90 tablet, Rfl: 1 .  metoprolol succinate (TOPROL-XL) 50 MG 24 hr tablet, TAKE 1 TABLET (50 MG TOTAL) BY MOUTH DAILY. TAKE WITH OR IMMEDIATELY FOLLOWING A MEAL., Disp: 90 tablet, Rfl: 1 .  Multiple Vitamins-Minerals (MULTIVITAMIN WITH MINERALS) tablet, Take 1 tablet by mouth daily., Disp: , Rfl:  .  omeprazole (PRILOSEC) 40 MG capsule, Take by mouth., Disp: , Rfl:  .  pravastatin (PRAVACHOL) 40 MG tablet, TAKE 1  TABLET BY MOUTH EVERY DAY, Disp: 90 tablet, Rfl: 0 .  triamcinolone cream (KENALOG) 0.1 %, APPLY TO AFFECTED AREA TWICE A DAY, Disp: 30 g, Rfl: 0 .  umeclidinium-vilanterol (ANORO ELLIPTA) 62.5-25 MCG/INH AEPB, Inhale into the lungs. Dr A, Disp: , Rfl:  .  omeprazole (PRILOSEC) 20 MG capsule, Take 1 capsule (20 mg total) by mouth daily. (Patient not taking: Reported on 03/13/2018), Disp: 90 capsule, Rfl: 0  Family History  Problem Relation Age of Onset  . Diabetes Mother   . Hypertension Mother   . Breast cancer Sister 32     Social History   Tobacco Use  . Smoking status: Former Smoker    Packs/day: 0.50    Years: 18.00    Pack years: 9.00    Types: Cigarettes  . Smokeless tobacco: Never Used  Substance Use Topics  . Alcohol use: No    Alcohol/week: 0.0 standard drinks  . Drug use: No    Allergies as of 03/13/2018 - Review Complete 03/13/2018  Allergen Reaction Noted  . Latex Itching 11/14/2014    Review of Systems:    All systems reviewed and negative except where noted in HPI.   Physical Exam:  BP 116/78 (BP Location: Left Arm, Patient Position: Sitting, Cuff Size: Normal)   Pulse 81   Resp 17   Ht 5\' 7"  (1.702 m)   Wt 160 lb 6.4 oz (72.8 kg)   BMI 25.12 kg/m  No LMP recorded. Patient is postmenopausal.  General:   Alert,  Well-developed, well-nourished, pleasant and cooperative in NAD Head:  Normocephalic and atraumatic. Eyes:  Sclera clear, no icterus.   Conjunctiva pink. Ears:  Normal auditory acuity. Nose:  No deformity, discharge, or lesions. Mouth:  No deformity or lesions,oropharynx pink & moist. Neck:  Supple; no masses or thyromegaly. Lungs:  Respirations even and unlabored.  Clear throughout to auscultation.   No wheezes, crackles, or rhonchi. No acute distress. Heart:  Regular rate and rhythm; no murmurs, clicks, rubs, or gallops. Abdomen:  Normal bowel sounds.  No bruits.  Soft, non-tender and non-distended without masses, hepatosplenomegaly or  hernias noted.  No guarding or rebound tenderness.   Rectal: Nor performed Msk:  Symmetrical without gross deformities. Good, equal movement & strength bilaterally. Pulses:  Normal pulses noted. Extremities:  No clubbing or edema.  No cyanosis. Neurologic:  Alert and oriented x3;  grossly normal neurologically. Skin:  Intact without significant lesions or rashes. No jaundice. Psych:  Alert and cooperative. Normal mood and affect.  Imaging Studies: X ray UGI 2011 RESULT:     Barium swallow upper GI examination is performed with a double  contrast technique. The patient easily ingested the liquid barium. The  esophageal mucosa and morphology appear normal. The stomach distends  fully.  The gastric mucosa is unremarkable. Barium flows easily into the small  bowel. There is gastroesophageal reflux demonstrated into the mid to  proximal esophagus with and without reflux maneuvers. No esophageal  mucosal  deformity or esophageal stenosis was  evident. Good esophageal peristalsis  was present with a prone swallow with some limited proximal escape  demonstrated.   Assessment and Plan:   Jernie [place] Mccully is a 68 y.o. female, ex- smoker, no major medical problems, chronic intermittent dysphagia to solids only, heart burn with no weight loss, anemia.  EGD unremarkable for EOE, there was presence of widely patent Schatzki's ring only.  Symptoms of dysphagia have resolved.  Since it has been more than 1 year that she stayed on omeprazole 40 mg daily, I advised her either to continue present 20 mg daily ER stop to see if she develops any recurrence of symptoms.  She has 1 year history of mild, chronic, persistent, abdominal discomfort in the infraumbilical area without any associated symptoms or alarm features.  She had ultrasound in 2018 which was unremarkable.  Abdominal pain is most likely secondary to stress. I do not recommend any further work-up at this time.  If her pain is progressive,  recommend CT abdomen and pelvis with and without contrast for further evaluation  History of colon polyps, due for surveillance colonoscopy in 2021  Follow up as needed   Taylor Darby, MD

## 2018-03-20 DIAGNOSIS — J449 Chronic obstructive pulmonary disease, unspecified: Secondary | ICD-10-CM | POA: Diagnosis not present

## 2018-03-20 DIAGNOSIS — R69 Illness, unspecified: Secondary | ICD-10-CM | POA: Diagnosis not present

## 2018-04-06 ENCOUNTER — Other Ambulatory Visit: Payer: Self-pay | Admitting: Family Medicine

## 2018-04-06 DIAGNOSIS — R1310 Dysphagia, unspecified: Secondary | ICD-10-CM

## 2018-04-06 DIAGNOSIS — R12 Heartburn: Secondary | ICD-10-CM

## 2018-04-06 DIAGNOSIS — R1319 Other dysphagia: Secondary | ICD-10-CM

## 2018-05-12 ENCOUNTER — Other Ambulatory Visit: Payer: Self-pay | Admitting: Family Medicine

## 2018-05-12 DIAGNOSIS — I10 Essential (primary) hypertension: Secondary | ICD-10-CM

## 2018-05-21 ENCOUNTER — Other Ambulatory Visit: Payer: Self-pay | Admitting: Family Medicine

## 2018-05-21 DIAGNOSIS — D529 Folate deficiency anemia, unspecified: Secondary | ICD-10-CM

## 2018-06-12 ENCOUNTER — Ambulatory Visit: Payer: Self-pay

## 2018-06-17 DIAGNOSIS — R69 Illness, unspecified: Secondary | ICD-10-CM | POA: Diagnosis not present

## 2018-08-07 ENCOUNTER — Other Ambulatory Visit: Payer: Self-pay | Admitting: Family Medicine

## 2018-08-07 DIAGNOSIS — I10 Essential (primary) hypertension: Secondary | ICD-10-CM

## 2018-08-10 DIAGNOSIS — R69 Illness, unspecified: Secondary | ICD-10-CM | POA: Diagnosis not present

## 2018-08-12 ENCOUNTER — Other Ambulatory Visit: Payer: Self-pay | Admitting: Family Medicine

## 2018-08-12 DIAGNOSIS — D529 Folate deficiency anemia, unspecified: Secondary | ICD-10-CM

## 2018-08-18 ENCOUNTER — Other Ambulatory Visit: Payer: Self-pay | Admitting: Family Medicine

## 2018-08-18 DIAGNOSIS — E782 Mixed hyperlipidemia: Secondary | ICD-10-CM

## 2018-08-21 ENCOUNTER — Encounter: Payer: Self-pay | Admitting: Family Medicine

## 2018-08-21 ENCOUNTER — Ambulatory Visit (INDEPENDENT_AMBULATORY_CARE_PROVIDER_SITE_OTHER): Payer: Medicare HMO | Admitting: Family Medicine

## 2018-08-21 ENCOUNTER — Other Ambulatory Visit: Payer: Self-pay

## 2018-08-21 VITALS — BP 130/80 | HR 80 | Ht 67.0 in | Wt 161.0 lb

## 2018-08-21 DIAGNOSIS — I10 Essential (primary) hypertension: Secondary | ICD-10-CM

## 2018-08-21 DIAGNOSIS — R69 Illness, unspecified: Secondary | ICD-10-CM

## 2018-08-21 DIAGNOSIS — D529 Folate deficiency anemia, unspecified: Secondary | ICD-10-CM | POA: Diagnosis not present

## 2018-08-21 DIAGNOSIS — J432 Centrilobular emphysema: Secondary | ICD-10-CM | POA: Diagnosis not present

## 2018-08-21 DIAGNOSIS — E782 Mixed hyperlipidemia: Secondary | ICD-10-CM

## 2018-08-21 DIAGNOSIS — Z1211 Encounter for screening for malignant neoplasm of colon: Secondary | ICD-10-CM

## 2018-08-21 DIAGNOSIS — J449 Chronic obstructive pulmonary disease, unspecified: Secondary | ICD-10-CM | POA: Insufficient documentation

## 2018-08-21 LAB — HEMOCCULT GUIAC POC 1CARD (OFFICE): Fecal Occult Blood, POC: NEGATIVE

## 2018-08-21 MED ORDER — METOPROLOL SUCCINATE ER 50 MG PO TB24: 50.0000 mg | ORAL_TABLET | Freq: Every day | ORAL | 1 refills | Status: DC

## 2018-08-21 MED ORDER — HYDROCHLOROTHIAZIDE 25 MG PO TABS: 25.0000 mg | ORAL_TABLET | Freq: Every day | ORAL | 1 refills | Status: DC

## 2018-08-21 MED ORDER — FOLIC ACID 800 MCG PO TABS: ORAL_TABLET | ORAL | 1 refills | Status: DC

## 2018-08-21 MED ORDER — PRAVASTATIN SODIUM 40 MG PO TABS: 40.0000 mg | ORAL_TABLET | Freq: Every day | ORAL | 1 refills | Status: DC

## 2018-08-21 MED ORDER — LOSARTAN POTASSIUM 100 MG PO TABS: 100.0000 mg | ORAL_TABLET | Freq: Every day | ORAL | 1 refills | Status: DC

## 2018-08-21 NOTE — Patient Instructions (Signed)
Mediterranean Diet  A Mediterranean diet refers to food and lifestyle choices that are based on the traditions of countries located on the [place]. This way of eating has been shown to help prevent certain conditions and improve outcomes for people who have chronic diseases, like kidney disease and heart disease.  What are tips for following this plan?  Lifestyle   Cook and eat meals together with your family, when possible.   Drink enough fluid to keep your urine clear or pale yellow.   Be physically active every day. This includes:  ? Aerobic exercise like running or swimming.  ? Leisure activities like gardening, walking, or housework.   Get 7-8 hours of sleep each night.   If recommended by your health care provider, drink red wine in moderation. This means 1 glass a day for nonpregnant women and 2 glasses a day for men. A glass of wine equals 5 oz (150 mL).  Reading food labels     Check the serving size of packaged foods. For foods such as rice and pasta, the serving size refers to the amount of cooked product, not dry.   Check the total fat in packaged foods. Avoid foods that have saturated fat or trans fats.   Check the ingredients list for added sugars, such as corn syrup.  Shopping   At the grocery store, buy most of your food from the areas near the walls of the store. This includes:  ? Fresh fruits and vegetables (produce).  ? Grains, beans, nuts, and seeds. Some of these may be available in unpackaged forms or large amounts (in bulk).  ? Fresh seafood.  ? Poultry and eggs.  ? Low-fat dairy products.   Buy whole ingredients instead of prepackaged foods.   Buy fresh fruits and vegetables in-season from local farmers markets.   Buy frozen fruits and vegetables in resealable bags.   If you do not have access to quality fresh seafood, buy precooked frozen shrimp or canned fish, such as tuna, salmon, or sardines.   Buy small amounts of raw or cooked vegetables, salads, or olives from  the deli or salad bar at your store.   Stock your pantry so you always have certain foods on hand, such as olive oil, canned tuna, canned tomatoes, rice, pasta, and beans.  Cooking   Cook foods with extra-virgin olive oil instead of using butter or other vegetable oils.   Have meat as a side dish, and have vegetables or grains as your main dish. This means having meat in small portions or adding small amounts of meat to foods like pasta or stew.   Use beans or vegetables instead of meat in common dishes like chili or lasagna.   Experiment with different cooking methods. Try roasting or broiling vegetables instead of steaming or sauteing them.   Add frozen vegetables to soups, stews, pasta, or rice.   Add nuts or seeds for added healthy fat at each meal. You can add these to yogurt, salads, or vegetable dishes.   Marinate fish or vegetables using olive oil, lemon juice, garlic, and fresh herbs.  Meal planning     Plan to eat 1 vegetarian meal one day each week. Try to work up to 2 vegetarian meals, if possible.   Eat seafood 2 or more times a week.   Have healthy snacks readily available, such as:  ? Vegetable sticks with hummus.  ? Greek yogurt.  ? Fruit and nut trail mix.   Eat balanced   meals throughout the week. This includes:  ? Fruit: 2-3 servings a day  ? Vegetables: 4-5 servings a day  ? Low-fat dairy: 2 servings a day  ? Fish, poultry, or lean meat: 1 serving a day  ? Beans and legumes: 2 or more servings a week  ? Nuts and seeds: 1-2 servings a day  ? Whole grains: 6-8 servings a day  ? Extra-virgin olive oil: 3-4 servings a day   Limit red meat and sweets to only a few servings a month  What are my food choices?   Mediterranean diet  ? Recommended  ? Grains: Whole-grain pasta. Brown rice. Bulgar wheat. Polenta. Couscous. Whole-wheat bread. Oatmeal. Quinoa.  ? Vegetables: Artichokes. Beets. Broccoli. Cabbage. Carrots. Eggplant. Green beans. Chard. Kale. Spinach. Onions. Leeks. Peas. Squash.  Tomatoes. Peppers. Radishes.  ? Fruits: Apples. Apricots. Avocado. Berries. Bananas. Cherries. Dates. Figs. Grapes. Lemons. Melon. Oranges. Peaches. Plums. Pomegranate.  ? Meats and other protein foods: Beans. Almonds. Sunflower seeds. Pine nuts. Peanuts. Cod. Salmon. Scallops. Shrimp. Tuna. Tilapia. Clams. Oysters. Eggs.  ? Dairy: Low-fat milk. Cheese. Greek yogurt.  ? Beverages: Water. Red wine. Herbal tea.  ? Fats and oils: Extra virgin olive oil. Avocado oil. Grape seed oil.  ? Sweets and desserts: Greek yogurt with honey. Baked apples. Poached pears. Trail mix.  ? Seasoning and other foods: Basil. Cilantro. Coriander. Cumin. Mint. Parsley. Sage. Rosemary. Tarragon. Garlic. Oregano. Thyme. Pepper. Balsalmic vinegar. Tahini. Hummus. Tomato sauce. Olives. Mushrooms.  ? Limit these  ? Grains: Prepackaged pasta or rice dishes. Prepackaged cereal with added sugar.  ? Vegetables: Deep fried potatoes (french fries).  ? Fruits: Fruit canned in syrup.  ? Meats and other protein foods: Beef. Pork. Lamb. Poultry with skin. Hot dogs. Bacon.  ? Dairy: Ice cream. Sour cream. Whole milk.  ? Beverages: Juice. Sugar-sweetened soft drinks. Beer. Liquor and spirits.  ? Fats and oils: Butter. Canola oil. Vegetable oil. Beef fat (tallow). Lard.  ? Sweets and desserts: Cookies. Cakes. Pies. Candy.  ? Seasoning and other foods: Mayonnaise. Premade sauces and marinades.  ? The items listed may not be a complete list. Talk with your dietitian about what dietary choices are right for you.  Summary   The Mediterranean diet includes both food and lifestyle choices.   Eat a variety of fresh fruits and vegetables, beans, nuts, seeds, and whole grains.   Limit the amount of red meat and sweets that you eat.   Talk with your health care provider about whether it is safe for you to drink red wine in moderation. This means 1 glass a day for nonpregnant women and 2 glasses a day for men. A glass of wine equals 5 oz (150 mL).  This information  is not intended to replace advice given to you by your health care provider. Make sure you discuss any questions you have with your health care provider.  Document Released: 10/16/2015 Document Revised: 11/18/2015 Document Reviewed: 10/16/2015  [employer] Interactive Patient Education  2019 [employer]

## 2018-08-21 NOTE — Progress Notes (Signed)
Date:  08/21/2018   Name:  Taylor Griffin   DOB:  1951/01/08   MRN:  [id number]   Chief Complaint: Anemia, Hypertension, and Hyperlipidemia  Anemia Presents for follow-up (folic acid def anemia) visit. There has been no abdominal pain, anorexia, bruising/bleeding easily, confusion, fever, leg swelling, light-headedness, malaise/fatigue, pallor, palpitations, paresthesias, pica or weight loss. Signs of blood loss that are not present include hematemesis, hematochezia, melena, menorrhagia and vaginal bleeding. There is no history of chronic renal disease, heart failure or hypothyroidism. There are no compliance problems.   Hypertension This is a chronic problem. The current episode started more than 1 year ago. The problem is unchanged. The problem is controlled. Pertinent negatives include no anxiety, blurred vision, chest pain, headaches, malaise/fatigue, neck pain, orthopnea, palpitations, peripheral edema, PND or shortness of breath. There are no associated agents to hypertension. There are no known risk factors for coronary artery disease. Past treatments include angiotensin blockers, beta blockers and diuretics. The current treatment provides mild improvement. There are no compliance problems.  There is no history of angina, kidney disease, CAD/MI, CVA, heart failure, left ventricular hypertrophy, PVD or retinopathy. There is no history of chronic renal disease, a hypertension causing med or renovascular disease.  Hyperlipidemia This is a chronic problem. The current episode started more than 1 year ago. The problem is controlled. Recent lipid tests were reviewed and are normal. She has no history of chronic renal disease, diabetes, hypothyroidism, liver disease, obesity or nephrotic syndrome. Pertinent negatives include no chest pain, focal sensory loss, focal weakness, leg pain, myalgias or shortness of breath. Current antihyperlipidemic treatment includes statins. The current treatment  provides moderate improvement of lipids. There are no compliance problems.  Risk factors for coronary artery disease include dyslipidemia, post-menopausal and hypertension.    Review of Systems  Constitutional: Negative.  Negative for chills, fatigue, fever, malaise/fatigue, unexpected weight change and weight loss.  HENT: Negative for congestion, ear discharge, ear pain, rhinorrhea, sinus pressure, sneezing and sore throat.   Eyes: Negative for blurred vision, photophobia, pain, discharge, redness and itching.  Respiratory: Negative for cough, shortness of breath, wheezing and stridor.   Cardiovascular: Negative for chest pain, palpitations, orthopnea and PND.  Gastrointestinal: Negative for abdominal pain, anorexia, blood in stool, constipation, diarrhea, hematemesis, hematochezia, melena, nausea and vomiting.  Endocrine: Negative for cold intolerance, heat intolerance, polydipsia, polyphagia and polyuria.  Genitourinary: Negative for dysuria, flank pain, frequency, hematuria, menorrhagia, menstrual problem, pelvic pain, urgency, vaginal bleeding and vaginal discharge.  Musculoskeletal: Negative for arthralgias, back pain, myalgias and neck pain.  Skin: Negative for pallor and rash.  Allergic/Immunologic: Negative for environmental allergies and food allergies.  Neurological: Negative for dizziness, focal weakness, weakness, light-headedness, numbness, headaches and paresthesias.  Hematological: Negative for adenopathy. Does not bruise/bleed easily.  Psychiatric/Behavioral: Negative for confusion and dysphoric mood. The patient is not nervous/anxious.     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   Hyperlipidemia, unspecified 05/03/2016   Hypertension 05/03/2016    Allergies  Allergen Reactions   Latex Itching    Past Surgical History:  Procedure Laterality Date   COLONOSCOPY WITH PROPOFOL N/A 11/22/2014   Procedure: COLONOSCOPY WITH  PROPOFOL;  Surgeon: Lucilla Lame, MD;  Location: [facility];  Service: Endoscopy;  Laterality: N/A;  LATEX ALLERGY   ECTOPIC PREGNANCY SURGERY     ESOPHAGOGASTRODUODENOSCOPY (EGD) WITH PROPOFOL N/A 01/26/2017   Procedure: ESOPHAGOGASTRODUODENOSCOPY (EGD) WITH PROPOFOL;  Surgeon: Lin Landsman, MD;  Location: [facility];  Service: Endoscopy;  Laterality: N/A;    Social History   Tobacco Use   Smoking status: Former Smoker    Packs/day: 0.50    Years: 18.00    Pack years: 9.00    Types: Cigarettes   Smokeless tobacco: Never Used  Substance Use Topics   Alcohol use: No    Alcohol/week: 0.0 standard drinks   Drug use: No     Medication list has been reviewed and updated.  Current Meds  Medication Sig   aspirin 81 MG tablet Take 1 tablet (81 mg total) by mouth daily. AM   [facility] FOLIC ACID 314 MCG tablet TAKE 1 TABLET DAILY. PT NEEDS TO TAKE 1 WHOLE PILL, NOT THE HALF- PLEASE D/C THE HALF TABLET   fluticasone (FLONASE) 50 MCG/ACT nasal spray Place into the nose. Dr A   hydrochlorothiazide (HYDRODIURIL) 25 MG tablet Take 1 tablet (25 mg total) by mouth daily.   Ipratropium-Albuterol (COMBIVENT) 20-100 MCG/ACT AERS respimat Inhale into the lungs. Dr A   ipratropium-albuterol (DUONEB) 0.5-2.5 (3) MG/3ML SOLN Inhale into the lungs. Dr A   losartan (COZAAR) 100 MG tablet Take 1 tablet (100 mg total) by mouth daily.   metoprolol succinate (TOPROL-XL) 50 MG 24 hr tablet TAKE 1 TABLET (50 MG TOTAL) BY MOUTH DAILY. TAKE WITH OR IMMEDIATELY FOLLOWING A MEAL.   Multiple Vitamins-Minerals (MULTIVITAMIN WITH MINERALS) tablet Take 1 tablet by mouth daily.   pravastatin (PRAVACHOL) 40 MG tablet TAKE 1 TABLET BY MOUTH EVERY DAY   triamcinolone cream (KENALOG) 0.1 % APPLY TO AFFECTED AREA TWICE A DAY    PHQ 2/9 Scores 08/21/2018 02/20/2018 01/11/2018 07/11/2017  PHQ - 2 Score 0 0 0 0  PHQ- 9 Score 0 0 0 -    BP Readings from Last 3 Encounters:  08/21/18 130/80    03/13/18 116/78  02/20/18 120/60    Physical Exam Vitals signs and nursing note reviewed.  Constitutional:      General: She is not in acute distress.    Appearance: Normal appearance. She is normal weight. She is not diaphoretic.  HENT:     Head: Normocephalic and atraumatic.     Right Ear: Tympanic membrane, ear canal and external ear normal.     Left Ear: Tympanic membrane, ear canal and external ear normal.     Nose: Nose normal.  Eyes:     General:        Right eye: No discharge.        Left eye: No discharge.     Conjunctiva/sclera: Conjunctivae normal.     Pupils: Pupils are equal, round, and reactive to light.  Neck:     Musculoskeletal: Normal range of motion and neck supple.     Thyroid: No thyromegaly.     Vascular: No JVD.  Cardiovascular:     Rate and Rhythm: Normal rate and regular rhythm.  No extrasystoles are present.    Chest Wall: PMI is not displaced. No thrill.     Pulses: Normal pulses. No decreased pulses.          Carotid pulses are 2+ on the right side and 2+ on the left side.      Radial pulses are 2+ on the right side and 2+ on the left side.       Femoral pulses are 2+ on the right side and 2+ on the left side.      Popliteal pulses are 2+ on the right side and 2+  on the left side.       Dorsalis pedis pulses are 2+ on the right side and 2+ on the left side.       Posterior tibial pulses are 2+ on the right side and 2+ on the left side.     Heart sounds: Normal heart sounds, S1 normal and S2 normal. No murmur. No systolic murmur. No diastolic murmur. No friction rub. No gallop. No S3 or S4 sounds.   Pulmonary:     Effort: Pulmonary effort is normal.     Breath sounds: Normal breath sounds. No wheezing or rhonchi.  Abdominal:     General: Bowel sounds are normal. There is no distension.     Palpations: Abdomen is soft. There is no mass.     Tenderness: There is no abdominal tenderness. There is no left CVA tenderness or guarding.  Genitourinary:     Rectum: Normal. Guaiac result negative. No mass.  Musculoskeletal: Normal range of motion.        General: No swelling, tenderness, deformity or signs of injury.     Right lower leg: No edema.     Left lower leg: No edema.  Lymphadenopathy:     Cervical: No cervical adenopathy.  Skin:    General: Skin is warm and dry.  Neurological:     General: No focal deficit present.     Mental Status: She is alert and oriented to person, place, and time.     Cranial Nerves: No cranial nerve deficit.     Deep Tendon Reflexes: Reflexes are normal and symmetric.     Wt Readings from Last 3 Encounters:  08/21/18 161 lb (73 kg)  03/13/18 160 lb 6.4 oz (72.8 kg)  02/20/18 165 lb (74.8 kg)    BP 130/80    Pulse 80    Ht 5\' 7"  (1.702 m)    Wt 161 lb (73 kg)    BMI 25.22 kg/m   Assessment and Plan:  1. Anemia due to folic acid deficiency, unspecified deficiency type Patient has a history of folic acid deficiency with anemia.  Will check a CBC with platelet count to assess indices as well as hemoglobin. - folic acid ([employer]S FOLIC ACID) 616 MCG tablet; TAKE 1 TABLET DAILY. PT NEEDS TO TAKE 1 WHOLE PILL, NOT THE HALF- PLEASE D/C THE HALF TABLET  Dispense: 90 tablet; Refill: 1 - CBC w/Diff/Platelet - POCT Occult Blood Stool  2. Essential hypertension .  Controlled.  Continue hydrochlorothiazide 25 mg, losartan 100 mg, and metoprolol XL 50 mg daily.  Check renal function panel. - hydrochlorothiazide (HYDRODIURIL) 25 MG tablet; Take 1 tablet (25 mg total) by mouth daily.  Dispense: 90 tablet; Refill: 1 - losartan (COZAAR) 100 MG tablet; Take 1 tablet (100 mg total) by mouth daily.  Dispense: 90 tablet; Refill: 1 - metoprolol succinate (TOPROL-XL) 50 MG 24 hr tablet; Take 1 tablet (50 mg total) by mouth daily. Take with or immediately following a meal.  Dispense: 90 tablet; Refill: 1 - Renal Function Panel  3. Mixed hyperlipidemia Chronic.  Controlled.  Continue pravastatin 40 mg once a day.  Will check  renal panel as well as lipid panel. - pravastatin (PRAVACHOL) 40 MG tablet; Take 1 tablet (40 mg total) by mouth daily.  Dispense: 90 tablet; Refill: 1 - Lipid Panel With LDL/HDL Ratio  4. Taking medication for chronic disease Patient has been on statin will assess for hepatotoxicity with  - Hepatic function panel  5. Centrilobular emphysema ([place]) Patient  has a history of centrilobular emphysema which is controlled with albuterol Combivent as well occasional DuoNeb pending severity of her episodes. - Ipratropium-Albuterol (COMBIVENT) 20-100 MCG/ACT AERS respimat; Inhale into the lungs. Dr A - ipratropium-albuterol (DUONEB) 0.5-2.5 (3) MG/3ML SOLN; Inhale into the lungs. Dr A  6. Colon cancer screening Cancer screening was evaluated and was noted to be quite negative. - POCT Occult Blood Stool  Patient underwent [facility] screening for [employer] for which specifically chronic kidney disease was ruled out with renal function panel cognitive function was noted to be normal with exam depression screening with PHQ 9 was normal hepatitis C was noted to be done in 2018 and distal pulses in the lower extremities were noted to be 2+.  Screening was done with a guaiac that was negative and this is the only thing patient while done for which she refuses to have colonoscopy or any further colon evaluation.  Patient did have a colonoscopy in 2016 which needed to be repeated in 5 years.

## 2018-08-22 LAB — CBC WITH DIFFERENTIAL/PLATELET
Basophils Absolute: 0.1 10*3/uL (ref 0.0–0.2)
Basos: 1 %
EOS (ABSOLUTE): 0.2 10*3/uL (ref 0.0–0.4)
Eos: 3 %
Hematocrit: 41.2 % (ref 34.0–46.6)
Hemoglobin: 13.9 g/dL (ref 11.1–15.9)
Immature Grans (Abs): 0 10*3/uL (ref 0.0–0.1)
Immature Granulocytes: 0 %
Lymphocytes Absolute: 1.9 10*3/uL (ref 0.7–3.1)
Lymphs: 31 %
MCH: 31 pg (ref 26.6–33.0)
MCHC: 33.7 g/dL (ref 31.5–35.7)
MCV: 92 fL (ref 79–97)
Monocytes Absolute: 0.5 10*3/uL (ref 0.1–0.9)
Monocytes: 8 %
Neutrophils Absolute: 3.4 10*3/uL (ref 1.4–7.0)
Neutrophils: 57 %
Platelets: 312 10*3/uL (ref 150–450)
RBC: 4.49 x10E6/uL (ref 3.77–5.28)
RDW: 13.1 % (ref 11.7–15.4)
WBC: 6 10*3/uL (ref 3.4–10.8)

## 2018-08-22 LAB — HEPATIC FUNCTION PANEL
ALT: 25 IU/L (ref 0–32)
AST: 39 IU/L (ref 0–40)
Alkaline Phosphatase: 71 IU/L (ref 39–117)
Bilirubin Total: 0.5 mg/dL (ref 0.0–1.2)
Bilirubin, Direct: 0.12 mg/dL (ref 0.00–0.40)
Total Protein: 7.3 g/dL (ref 6.0–8.5)

## 2018-08-22 LAB — LIPID PANEL WITH LDL/HDL RATIO
Cholesterol, Total: 194 mg/dL (ref 100–199)
HDL: 42 mg/dL (ref >39)
LDL Calculated: 127 mg/dL — ABNORMAL HIGH (ref 0–99)
LDl/HDL Ratio: 3 ratio (ref 0.0–3.2)
Triglycerides: 126 mg/dL (ref 0–149)
VLDL Cholesterol Cal: 25 mg/dL (ref 5–40)

## 2018-08-22 LAB — RENAL FUNCTION PANEL
Albumin: 4.5 g/dL (ref 3.8–4.8)
BUN/Creatinine Ratio: 18 (ref 12–28)
BUN: 22 mg/dL (ref 8–27)
CO2: 24 mmol/L (ref 20–29)
Calcium: 9.8 mg/dL (ref 8.7–10.3)
Chloride: 96 mmol/L (ref 96–106)
Creatinine, Ser: 1.19 mg/dL — ABNORMAL HIGH (ref 0.57–1.00)
GFR calc Af Amer: 54 mL/min/{1.73_m2} — ABNORMAL LOW (ref >59)
GFR calc non Af Amer: 47 mL/min/{1.73_m2} — ABNORMAL LOW (ref >59)
Glucose: 99 mg/dL (ref 65–99)
Phosphorus: 3.6 mg/dL (ref 3.0–4.3)
Potassium: 4.3 mmol/L (ref 3.5–5.2)
Sodium: 139 mmol/L (ref 134–144)

## 2018-08-28 DIAGNOSIS — Z1159 Encounter for screening for other viral diseases: Secondary | ICD-10-CM | POA: Diagnosis not present

## 2018-08-29 DIAGNOSIS — R69 Illness, unspecified: Secondary | ICD-10-CM | POA: Diagnosis not present

## 2018-09-01 ENCOUNTER — Other Ambulatory Visit: Payer: Self-pay | Admitting: Family Medicine

## 2018-09-01 DIAGNOSIS — I10 Essential (primary) hypertension: Secondary | ICD-10-CM

## 2018-09-06 ENCOUNTER — Ambulatory Visit (INDEPENDENT_AMBULATORY_CARE_PROVIDER_SITE_OTHER): Payer: Medicare HMO

## 2018-09-06 VITALS — BP 109/76 | HR 81 | Ht 67.0 in | Wt 161.0 lb

## 2018-09-06 DIAGNOSIS — Z598 Other problems related to housing and economic circumstances: Secondary | ICD-10-CM

## 2018-09-06 DIAGNOSIS — M810 Age-related osteoporosis without current pathological fracture: Secondary | ICD-10-CM | POA: Diagnosis not present

## 2018-09-06 DIAGNOSIS — J449 Chronic obstructive pulmonary disease, unspecified: Secondary | ICD-10-CM | POA: Diagnosis not present

## 2018-09-06 DIAGNOSIS — Z1231 Encounter for screening mammogram for malignant neoplasm of breast: Secondary | ICD-10-CM | POA: Diagnosis not present

## 2018-09-06 DIAGNOSIS — Z0000 Encounter for general adult medical examination without abnormal findings: Secondary | ICD-10-CM | POA: Diagnosis not present

## 2018-09-06 DIAGNOSIS — Z780 Asymptomatic menopausal state: Secondary | ICD-10-CM

## 2018-09-06 DIAGNOSIS — R69 Illness, unspecified: Secondary | ICD-10-CM | POA: Diagnosis not present

## 2018-09-06 DIAGNOSIS — M8580 Other specified disorders of bone density and structure, unspecified site: Secondary | ICD-10-CM

## 2018-09-06 DIAGNOSIS — Z599 Problem related to housing and economic circumstances, unspecified: Secondary | ICD-10-CM

## 2018-09-06 NOTE — Progress Notes (Signed)
Subjective:   Taylor Griffin is a 68 y.o. female who presents for an Initial Medicare Annual Wellness Visit.  Virtual Visit via Telephone Note  I connected with [name] on 09/06/18 at 10:40 AM EDT by telephone and verified that I am speaking with the correct person using two identifiers.  Medicare Annual Wellness visit completed telephonically due to Covid-19 pandemic.   Location: Patient: home Provider: office   I discussed the limitations, risks, security and privacy concerns of performing an evaluation and management service by telephone and the availability of in person appointments. The patient expressed understanding and agreed to proceed.  Some vital signs may be absent or patient reported.   [name], LPN   Review of Systems      Cardiac Risk Factors include: dyslipidemia;advanced age (>90men, >33 women);hypertension     Objective:    Today's Vitals   09/06/18 1047  BP: 109/76  Pulse: 81  Weight: 161 lb (73 kg)  Height: 5\' 7"  (1.702 m)   Body mass index is 25.22 kg/m.  Advanced Directives 09/06/2018 01/26/2017 11/22/2014 10/29/2014  Does Patient Have a Medical Advance Directive? Yes Yes Yes Yes  Type of Paramedic of [place];Living will [place] will  Does patient want to make changes to medical advance directive? - No - Patient declined - -  Copy of [place] in Chart? No - copy requested No - copy requested - No - copy requested    Current Medications (verified) Outpatient Encounter Medications as of 09/06/2018  Medication Sig   aspirin 81 MG tablet Take 1 tablet (81 mg total) by mouth daily. AM   fluticasone (FLONASE) 50 MCG/ACT nasal spray Place into the nose. Dr A   folic acid ([employer] FOLIC ACID) 638 MCG tablet TAKE 1 TABLET DAILY. PT NEEDS TO TAKE 1 WHOLE PILL, NOT THE HALF- PLEASE D/C THE HALF TABLET   hydrochlorothiazide (HYDRODIURIL) 25 MG tablet Take  1 tablet (25 mg total) by mouth daily.   Ipratropium-Albuterol (COMBIVENT) 20-100 MCG/ACT AERS respimat Inhale into the lungs. Dr A   ipratropium-albuterol (DUONEB) 0.5-2.5 (3) MG/3ML SOLN Inhale into the lungs. Dr A   losartan (COZAAR) 100 MG tablet Take 1 tablet (100 mg total) by mouth daily.   metoprolol succinate (TOPROL-XL) 50 MG 24 hr tablet Take 1 tablet (50 mg total) by mouth daily. Take with or immediately following a meal.   Multiple Vitamins-Minerals (MULTIVITAMIN WITH MINERALS) tablet Take 1 tablet by mouth daily.   pravastatin (PRAVACHOL) 40 MG tablet Take 1 tablet (40 mg total) by mouth daily.   [DISCONTINUED] triamcinolone cream (KENALOG) 0.1 % APPLY TO AFFECTED AREA TWICE A DAY   No facility-administered encounter medications on file as of 09/06/2018.     Allergies (verified) Latex   History: Past Medical History:  Diagnosis Date   Anemia    Anemia due to folic acid deficiency 9/37/3428   Arthritis    WRIST AND FINGERS   Benign neoplasm of ascending colon    Benign neoplasm of descending colon    Benign neoplasm of sigmoid colon    COPD (chronic obstructive pulmonary disease) ([place])    Esophageal dysphagia    Gastroesophageal reflux disease 07/11/2017   Heart burn    Hyperlipidemia    Hypertension    CONTROLLED WITH MEDS   Past Surgical History:  Procedure Laterality Date   COLONOSCOPY WITH PROPOFOL N/A 11/22/2014   Procedure: COLONOSCOPY WITH PROPOFOL;  Surgeon: Evangeline Gula  Allen Norris, MD;  Location: [facility];  Service: Endoscopy;  Laterality: N/A;  LATEX ALLERGY   ECTOPIC PREGNANCY SURGERY     ESOPHAGOGASTRODUODENOSCOPY (EGD) WITH PROPOFOL N/A 01/26/2017   Procedure: ESOPHAGOGASTRODUODENOSCOPY (EGD) WITH PROPOFOL;  Surgeon: Lin Landsman, MD;  Location: [facility];  Service: Endoscopy;  Laterality: N/A;   Family History  Problem Relation Age of Onset   Diabetes Mother    Hypertension Mother    Breast cancer Sister 29    Social History   Socioeconomic History   Marital status: Married    Spouse name: Not on file   Number of children: 2   Years of education: Not on file   Highest education level: Associate degree: academic program  Occupational History   Not on file  Social Needs   Financial resource strain: Somewhat hard   Food insecurity    Worry: Never true    Inability: Never true   Transportation needs    Medical: No    Non-medical: No  Tobacco Use   Smoking status: Former Smoker    Packs/day: 0.50    Years: 18.00    Pack years: 9.00    Types: Cigarettes   Smokeless tobacco: Never Used  Substance and Sexual Activity   Alcohol use: No    Alcohol/week: 0.0 standard drinks   Drug use: No   Sexual activity: Never  Lifestyle   Physical activity    Days per week: 0 days    Minutes per session: 0 min   Stress: Not at all  [place] on phone: More than three times a week    Gets together: More than three times a week    Attends religious service: More than 4 times per year    Active member of club or organization: No    Attends meetings of clubs or organizations: Never    Relationship status: Married  Other Topics Concern   Not on file  Social History Narrative   Not on file    Tobacco Counseling Counseling given: Not Answered   Clinical Intake:  Pre-visit preparation completed: Yes  Pain : No/denies pain     BMI - recorded: 25.22 Nutritional Status: BMI 25 -29 Overweight Nutritional Risks: None Diabetes: No  How often do you need to have someone help you when you read instructions, pamphlets, or other written materials from your doctor or pharmacy?: 1 - Never  Interpreter Needed?: No  Information entered by :: [name] LPN   Activities of Daily Living In your present state of health, do you have any difficulty performing the following activities: 09/06/2018 08/21/2018  Hearing? N N  Comment declines  hearing aids -  Vision? N N  Difficulty concentrating or making decisions? N N  Walking or climbing stairs? N N  Dressing or bathing? N N  Doing errands, shopping? N N  Preparing Food and eating ? N -  Using the Toilet? N -  In the past six months, have you accidently leaked urine? Y -  Comment ocasionally at night -  Do you have problems with loss of bowel control? N -  Managing your Medications? N -  Managing your Finances? N -  Housekeeping or managing your Housekeeping? N -  Some recent data might be hidden     Immunizations and Health Maintenance Immunization History  Administered Date(s) Administered   Influenza, High Dose Seasonal PF 11/01/2016, 01/11/2018   Pneumococcal Conjugate-13 05/03/2016   Pneumococcal  Polysaccharide-23 07/11/2017   Tdap 05/03/2016   There are no preventive care reminders to display for this patient.  Patient Care Team: Juline Patch, MD as PCP - General (Family Medicine)  Indicate any recent Medical Services you may have received from other than [facility] providers in the past year (date may be approximate).     Assessment:   This is a routine wellness examination for [place].  Hearing/Vision screen  Hearing Screening   125Hz  250Hz  500Hz  1000Hz  2000Hz  3000Hz  4000Hz  6000Hz  8000Hz   Right ear:           Left ear:           Comments: Pt denies hearing difficulty   Vision Screening Comments: Annual vision screenings at eye provider in [place] issues and exercise activities discussed: Current Exercise Habits: The patient does not participate in regular exercise at present, Exercise limited by: None identified  Goals     DIET - INCREASE WATER INTAKE     Recommend drinking 6-8 glasses of water per day      Depression Screen PHQ 2/9 Scores 09/06/2018 08/21/2018 08/21/2018 02/20/2018 01/11/2018 07/11/2017 05/05/2015  PHQ - 2 Score 0 0 0 0 0 0 0  PHQ- 9 Score - 0 0 0 0 - -    Fall Risk Fall Risk  09/06/2018 08/21/2018 01/11/2018  07/11/2017 05/05/2015  Falls in the past year? 0 0 0 No No  Number falls in past yr: 0 - - - -  Injury with Fall? 0 - - - -  Follow up Falls prevention discussed Falls evaluation completed - - -    FALL RISK PREVENTION PERTAINING TO THE HOME:  Any stairs in or around the home? Yes  If so, do they handrails? 2 steps outside Home free of loose throw rugs in walkways, pet beds, electrical cords, etc? Yes  Adequate lighting in your home to reduce risk of falls? Yes   ASSISTIVE DEVICES UTILIZED TO PREVENT FALLS:  Life alert? No  Use of a cane, walker or w/c? No  Grab bars in the bathroom? Yes  Shower chair or bench in shower? No  Elevated toilet seat or a handicapped toilet? No   DME ORDERS:  DME order needed?  No   TIMED UP AND GO:  Was the test performed? No . Telephonic visit.   Education: Fall risk prevention has been discussed.  Intervention(s) required? No   Cognitive Function: Mini cog done 08/21/18        Screening Tests Health Maintenance  Topic Date Due   INFLUENZA VACCINE  10/07/2018   MAMMOGRAM  06/14/2019   COLONOSCOPY  11/22/2019   TETANUS/TDAP  05/03/2026   DEXA SCAN  Completed   Hepatitis C Screening  Completed   PNA vac Low Risk Adult  Completed    Qualifies for Shingles Vaccine? Yes . Due for Shingrix. Education has been provided regarding the importance of this vaccine. Pt has been advised to call insurance company to determine out of pocket expense. Advised may also receive vaccine at local pharmacy or Health Dept. Verbalized acceptance and understanding.  Tdap: Up to date  Flu Vaccine: Up to date  Pneumococcal Vaccine: Up to date    Cancer Screenings:  Colorectal Screening: Completed 11/22/14. Repeat every 5 years.  Mammogram: Completed 06/14/17. Repeat every year. Ordered today. Pt provided with contact information and advised to call to schedule appt.   Bone Density: Completed 12/04/09. Results reflect  OSTEOPENIA. Repeat every 2  years. Ordered today. Pt  provided with contact information and advised to call to schedule appt.   Lung Cancer Screening: (Low Dose CT Chest recommended if Age 53-80 years, 30 pack-year currently smoking OR have quit w/in 15years.) does not qualify.     Additional Screening:  Hepatitis C Screening: does qualify; Completed 05/03/16  Vision Screening: Recommended annual ophthalmology exams for early detection of glaucoma and other disorders of the eye. Is the patient up to date with their annual eye exam?  Yes  Who is the provider or what is the name of the office in which the pt attends annual eye exams? Eye provider in [place]: Recommended annual dental exams for proper oral hygiene  Community Resource Referral:  CRR required this visit?  Yes      Plan:    I have personally reviewed and addressed the Medicare Annual Wellness questionnaire and have noted the following in the patients chart:  A. Medical and social history B. Use of alcohol, tobacco or illicit drugs  C. Current medications and supplements D. Functional ability and status E.  Nutritional status F.  Physical activity G. Advance directives H. List of other physicians I.  Hospitalizations, surgeries, and ER visits in previous 12 months J.  [place] such as hearing and vision if needed, cognitive and depression L. Referrals and appointments   In addition, I have reviewed and discussed with patient certain preventive protocols, quality metrics, and best practice recommendations. A written personalized care plan for preventive services as well as general preventive health recommendations were provided to patient.   Signed,  [name], LPN Nurse Health Advisor   Nurse Notes: pt referred to [facility] for med management due to combivent inhaler costing $100 per month

## 2018-09-06 NOTE — Patient Instructions (Signed)
Taylor Griffin , Thank you for taking time to come for your Medicare Wellness Visit. I appreciate your ongoing commitment to your health goals. Please review the following plan we discussed and let me know if I can assist you in the future.   Screening recommendations/referrals: Colonoscopy: done 11/22/14. Repeat in 2021. Mammogram: done 06/13/17. Please call [phone] to schedule your mammogram and bone density screening.  Bone Density: done 12/04/09 Recommended yearly ophthalmology/optometry visit for glaucoma screening and checkup Recommended yearly dental visit for hygiene and checkup  Vaccinations: Influenza vaccine: done 01/11/18 Pneumococcal vaccine: done 07/11/17  Tdap vaccine: done 05/03/16 Shingles vaccine: Shingrix discussed. Please contact your pharmacy for coverage information.    Advanced directives: Please bring a copy of your health care power of attorney and living will to the office at your convenience.  Conditions/risks identified: recommend drinking 6-8 glasses of water per day  Next appointment: Please follow up in one year for your Medicare Annual Wellness visit.     Preventive Care 25 Years and Older, Female Preventive care refers to lifestyle choices and visits with your health care provider that can promote health and wellness. What does preventive care include?  A yearly physical exam. This is also called an annual well check.  Dental exams once or twice a year.  Routine eye exams. Ask your health care provider how often you should have your eyes checked.  Personal lifestyle choices, including:  Daily care of your teeth and gums.  Regular physical activity.  Eating a healthy diet.  Avoiding tobacco and drug use.  Limiting alcohol use.  Practicing safe sex.  Taking low-dose aspirin every day.  Taking vitamin and mineral supplements as recommended by your health care provider. What happens during an annual well check? The services and screenings  done by your health care provider during your annual well check will depend on your age, overall health, lifestyle risk factors, and family history of disease. Counseling  Your health care provider may ask you questions about your:  Alcohol use.  Tobacco use.  Drug use.  Emotional well-being.  Home and relationship well-being.  Sexual activity.  Eating habits.  History of falls.  Memory and ability to understand (cognition).  Work and work Statistician.  Reproductive health. Screening  You may have the following tests or measurements:  Height, weight, and BMI.  Blood pressure.  Lipid and cholesterol levels. These may be checked every 5 years, or more frequently if you are over 50 years old.  Skin check.  Lung cancer screening. You may have this screening every year starting at age 73 if you have a 30-pack-year history of smoking and currently smoke or have quit within the past 15 years.  Fecal occult blood test (FOBT) of the stool. You may have this test every year starting at age 34.  Flexible sigmoidoscopy or colonoscopy. You may have a sigmoidoscopy every 5 years or a colonoscopy every 10 years starting at age 81.  Hepatitis C blood test.  Hepatitis B blood test.  Sexually transmitted disease (STD) testing.  Diabetes screening. This is done by checking your blood sugar (glucose) after you have not eaten for a while (fasting). You may have this done every 1-3 years.  Bone density scan. This is done to screen for osteoporosis. You may have this done starting at age 57.  Mammogram. This may be done every 1-2 years. Talk to your health care provider about how often you should have regular mammograms. Talk with your health care provider  about your test results, treatment options, and if necessary, the need for more tests. Vaccines  Your health care provider may recommend certain vaccines, such as:  Influenza vaccine. This is recommended every year.  Tetanus,  diphtheria, and acellular pertussis (Tdap, Td) vaccine. You may need a Td booster every 10 years.  Zoster vaccine. You may need this after age 9.  Pneumococcal 13-valent conjugate (PCV13) vaccine. One dose is recommended after age 104.  Pneumococcal polysaccharide (PPSV23) vaccine. One dose is recommended after age 12. Talk to your health care provider about which screenings and vaccines you need and how often you need them. This information is not intended to replace advice given to you by your health care provider. Make sure you discuss any questions you have with your health care provider. Document Released: 03/21/2015 Document Revised: 11/12/2015 Document Reviewed: 12/24/2014 Elsevier Interactive Patient Education  2017 [place] Prevention in the Home Falls can cause injuries. They can happen to people of all ages. There are many things you can do to make your home safe and to help prevent falls. What can I do on the outside of my home?  Regularly fix the edges of walkways and driveways and fix any cracks.  Remove anything that might make you trip as you walk through a door, such as a raised step or threshold.  Trim any bushes or trees on the path to your home.  Use bright outdoor lighting.  Clear any walking paths of anything that might make someone trip, such as rocks or tools.  Regularly check to see if handrails are loose or broken. Make sure that both sides of any steps have handrails.  Any raised decks and porches should have guardrails on the edges.  Have any leaves, snow, or ice cleared regularly.  Use sand or salt on walking paths during winter.  Clean up any spills in your garage right away. This includes oil or grease spills. What can I do in the bathroom?  Use night lights.  Install grab bars by the toilet and in the tub and shower. Do not use towel bars as grab bars.  Use non-skid mats or decals in the tub or shower.  If you need to sit down in  the shower, use a plastic, non-slip stool.  Keep the floor dry. Clean up any water that spills on the floor as soon as it happens.  Remove soap buildup in the tub or shower regularly.  Attach bath mats securely with double-sided non-slip rug tape.  Do not have throw rugs and other things on the floor that can make you trip. What can I do in the bedroom?  Use night lights.  Make sure that you have a light by your bed that is easy to reach.  Do not use any sheets or blankets that are too big for your bed. They should not hang down onto the floor.  Have a firm chair that has side arms. You can use this for support while you get dressed.  Do not have throw rugs and other things on the floor that can make you trip. What can I do in the kitchen?  Clean up any spills right away.  Avoid walking on wet floors.  Keep items that you use a lot in easy-to-reach places.  If you need to reach something above you, use a strong step stool that has a grab bar.  Keep electrical cords out of the way.  Do not use floor polish or  wax that makes floors slippery. If you must use wax, use non-skid floor wax.  Do not have throw rugs and other things on the floor that can make you trip. What can I do with my stairs?  Do not leave any items on the stairs.  Make sure that there are handrails on both sides of the stairs and use them. Fix handrails that are broken or loose. Make sure that handrails are as long as the stairways.  Check any carpeting to make sure that it is firmly attached to the stairs. Fix any carpet that is loose or worn.  Avoid having throw rugs at the top or bottom of the stairs. If you do have throw rugs, attach them to the floor with carpet tape.  Make sure that you have a light switch at the top of the stairs and the bottom of the stairs. If you do not have them, ask someone to add them for you. What else can I do to help prevent falls?  Wear shoes that:  Do not have high  heels.  Have rubber bottoms.  Are comfortable and fit you well.  Are closed at the toe. Do not wear sandals.  If you use a stepladder:  Make sure that it is fully opened. Do not climb a closed stepladder.  Make sure that both sides of the stepladder are locked into place.  Ask someone to hold it for you, if possible.  Clearly mark and make sure that you can see:  Any grab bars or handrails.  First and last steps.  Where the edge of each step is.  Use tools that help you move around (mobility aids) if they are needed. These include:  Canes.  Walkers.  Scooters.  Crutches.  Turn on the lights when you go into a dark area. Replace any light bulbs as soon as they burn out.  Set up your furniture so you have a clear path. Avoid moving your furniture around.  If any of your floors are uneven, fix them.  If there are any pets around you, be aware of where they are.  Review your medicines with your doctor. Some medicines can make you feel dizzy. This can increase your chance of falling. Ask your doctor what other things that you can do to help prevent falls. This information is not intended to replace advice given to you by your health care provider. Make sure you discuss any questions you have with your health care provider. Document Released: 12/19/2008 Document Revised: 07/31/2015 Document Reviewed: 03/29/2014 Elsevier Interactive Patient Education  2017 [employer].

## 2018-09-11 ENCOUNTER — Ambulatory Visit: Payer: Self-pay | Admitting: Pharmacist

## 2018-09-11 DIAGNOSIS — J449 Chronic obstructive pulmonary disease, unspecified: Secondary | ICD-10-CM

## 2018-09-11 NOTE — Chronic Care Management (AMB) (Addendum)
Chronic Care Management   Note  09/11/2018 Name: Taylor Griffin MRN: [id number] DOB: 04/30/1950   Subjective:   Taylor Griffin is a 68 y.o. year old female who is a primary care patient of Juline Patch, MD. The CM team was consulted for assistance with chronic disease management and care coordination, particularly for assistance with the cost of her inhaler. Taylor Griffin has a past medical history including but not limited to hypertension, hyperlipidemia, anemia and COPD.  I reached out to Taylor Griffin by phone today.   Taylor Griffin was given information about Chronic Care Management services today including:  1. CCM service includes personalized support from designated clinical staff supervised by her physician, including individualized plan of care and coordination with other care providers 2. 24/7 contact phone numbers for assistance for urgent and routine care needs. 3. Service will only be billed when office clinical staff spend 20 minutes or more in a month to coordinate care. 4. Only one practitioner may furnish and bill the service in a calendar month. 5. The patient may stop CCM services at any time (effective at the end of the month) by phone call to the office staff. 6. The patient will be responsible for cost sharing (co-pay) of up to 20% of the service fee (after annual deductible is met).  Patient agreed to services and verbal consent obtained.   Review of patient status, including review of consultants reports, laboratory and other test data, was performed as part of comprehensive evaluation and provision of chronic care management services.   Objective:  Lab Results  Component Value Date   CREATININE 1.19 (H) 08/21/2018   CREATININE 1.16 (H) 07/11/2017   CREATININE 1.23 (H) 11/01/2016    No results found for: HGBA1C     Component Value Date/Time   CHOL 194 08/21/2018 1138   TRIG 126 08/21/2018 1138   HDL 42 08/21/2018 1138   CHOLHDL 5.2 (H) 07/11/2017 1136   LDLCALC 127 (H) 08/21/2018 1138    BP Readings from Last 3 Encounters:  09/06/18 109/76  08/21/18 130/80  03/13/18 116/78    Allergies  Allergen Reactions  . Latex Itching    Medications Reviewed Today    Reviewed by [name], LPN (Licensed Practical Nurse) on 09/06/18 at [place] List Status: <None>  Medication Order Taking? Sig Documenting Provider Last Dose Status Informant  aspirin 81 MG tablet [id number] Yes Take 1 tablet (81 mg total) by mouth daily. AM Juline Patch, MD Taking Active   fluticasone [employer]) 50 MCG/ACT nasal spray [id number] Yes Place into the nose. Dr A [provider] Taking Active   folic acid ([facility] FOLIC ACID) 322 MCG tablet [id number] Yes TAKE 1 TABLET DAILY. PT NEEDS TO TAKE 1 WHOLE PILL, NOT THE HALF- PLEASE D/C THE HALF TABLET Juline Patch, MD Taking Active   hydrochlorothiazide (HYDRODIURIL) 25 MG tablet [id number] Yes Take 1 tablet (25 mg total) by mouth daily. Juline Patch, MD Taking Active   Ipratropium-Albuterol [facility]) 20-100 MCG/ACT AERS respimat [id number] Yes Inhale into the lungs. Dr A [provider] Taking Active   ipratropium-albuterol (DUONEB) 0.5-2.5 (3) MG/3ML SOLN [id number] Yes Inhale into the lungs. Dr A [provider] Taking Active   losartan (COZAAR) 100 MG tablet [id number] Yes Take 1 tablet (100 mg total) by mouth daily. Juline Patch, MD Taking Active   metoprolol succinate (TOPROL-XL) 50 MG 24 hr tablet [id number] Yes Take 1 tablet (50 mg total) by mouth daily.  Take with or immediately following a meal. Juline Patch, MD Taking Active   Multiple Vitamins-Minerals (MULTIVITAMIN WITH MINERALS) tablet [id number] Yes Take 1 tablet by mouth daily. [provider] Taking Active   pravastatin (PRAVACHOL) 40 MG tablet [id number] Yes Take 1 tablet (40 mg total) by mouth daily. Juline Patch, MD Taking Active            Assessment:   Goals Addressed             This Visit's Progress   . "My inhaler copayment is $100/month"       Current Barriers:  Marland Kitchen Knowledge Deficits related to Part D plan formulary and confusion regarding instruction on pulmonary medications . Financial Barriers  Pharmacist Clinical Goal(s):  Marland Kitchen Over the next 30 days, patient will work with CM Pharmacist to address needs related to medication regimen optimization and medication cost  Interventions: . Perform chart review o Patient last seen by Pulmonologist, Dr. Lanney Gins, on 03/20/18. - Mrs. Crammer had previously been switched from Combivent to the [employer], but reported upper airway irritation with the Anoro.  - Dr. Lanney Gins instructed Mrs. Scatena to resume "combivent and Duoneb via nebulizer to be used at home TID prn" . Counsel patient regarding her prescription health plan formulary o Note Combivent is a tier 4 option through patient's plan. Anoro is a tier 3 option. o Bevespi is a tier 3 alternative to Anoro/Combivent dosed twice daily . Review patient assistance program requirements. Patient denies meeting income requirements for patient assistance programs for inhalers listed above. Marland Kitchen Counsel patient regarding COPD medication administration o Mrs. Ellzey reports that she is currently using both Combivent and ipratropium/albuterol nebulizer solution four times daily on a scheduled basis per her prescription labels. Reports that she had previously only used ipratropium/albuterol nebulizer solution on an as needed basis and was instructed to continue to use it this way when she last saw Dr. Lanney Gins, but was confused as the labeling instructed for her to use the nebulizer "four times daily". - Confirm for patient that per Dr. Teodoro Kil note, patient was to use the Duoneb via nebulizer three times daily as needed - Mrs. Saban reports that she will resume using it this way. . Will call to follow up with Dr. Lanney Gins to recommend switch from Combivent  to [facility] for improved cost and convenience to patient  Patient Self Care Activities:  . Self administers medications as prescribed . Attends all scheduled provider appointments . Calls pharmacy for medication refills . Calls provider office for new concerns or questions  Initial goal documentation        Plan:  Will call to follow up with Dr. Lanney Gins to recommend switch from Combivent to [facility] for improved cost and convenience to patient  Harlow Asa, PharmD, [place] [phone]

## 2018-09-12 ENCOUNTER — Ambulatory Visit: Payer: Self-pay | Admitting: Pharmacist

## 2018-09-12 DIAGNOSIS — J449 Chronic obstructive pulmonary disease, unspecified: Secondary | ICD-10-CM

## 2018-09-12 NOTE — Patient Instructions (Addendum)
Thank you allowing the Chronic Care Management Team to be a part of your care! It was a pleasure speaking with you today!     CCM (Chronic Care Management) Team    Janci Minor RN, BSN Nurse Care Coordinator  [phone]   Harlow Asa PharmD  Clinical Pharmacist  [phone]   Eula Fried LCSW Clinical Social Worker [phone]  Visit Information  Goals Addressed            This Visit's Progress   . "My inhaler copayment is $100/month"       Current Barriers:  Marland Kitchen Knowledge Deficits related to Part D plan formulary and confusion regarding instruction on pulmonary medications . Financial Barriers  Pharmacist Clinical Goal(s):  Marland Kitchen Over the next 30 days, patient will work with CM Pharmacist to address needs related to medication regimen optimization and medication cost  Interventions: . Perform chart review o Patient last seen by Pulmonologist, Dr. Lanney Gins, on 03/20/18. - Mrs. Vest had previously been switched from Combivent to the [employer], but reported upper airway irritation with the Anoro.  - Dr. Lanney Gins instructed Mrs. Isadore to resume "combivent and Duoneb via nebulizer to be used at home TID prn" . Counsel patient regarding her prescription health plan formulary o Note Combivent is a tier 4 option through patient's plan. Anoro is a tier 3 option. o Bevespi is a tier 3 alternative to Anoro/Combivent dosed twice daily . Review patient assistance program requirements. Patient denies meeting income requirements for patient assistance programs for inhalers listed above. Marland Kitchen Counsel patient regarding COPD medication administration o Mrs. Sahm reports that she is currently using both Combivent and ipratropium/albuterol nebulizer solution four times daily on a scheduled basis per her prescription labels. Reports that she had previously only used ipratropium/albuterol nebulizer solution on an as needed basis and was instructed to continue to use it this  way when she last saw Dr. Lanney Gins, but was confused as the labeling instructed for her to use the nebulizer "four times daily". - Confirm for patient that per Dr. Teodoro Kil note, patient was to use the Duoneb via nebulizer three times daily as needed - Mrs. Wetmore reports that she will resume using it this way. . Will call to follow up with Dr. Lanney Gins to recommend switch from Combivent to [facility] for improved cost and convenience to patient  Patient Self Care Activities:  . Self administers medications as prescribed . Attends all scheduled provider appointments . Calls pharmacy for medication refills . Calls provider office for new concerns or questions  Initial goal documentation        Ms. Almanzar was given information about Chronic Care Management services today including:  1. CCM service includes personalized support from designated clinical staff supervised by her physician, including individualized plan of care and coordination with other care providers 2. 24/7 contact phone numbers for assistance for urgent and routine care needs. 3. Service will only be billed when office clinical staff spend 20 minutes or more in a month to coordinate care. 4. Only one practitioner may furnish and bill the service in a calendar month. 5. The patient may stop CCM services at any time (effective at the end of the month) by phone call to the office staff. 6. The patient will be responsible for cost sharing (co-pay) of up to 20% of the service fee (after annual deductible is met).  Patient agreed to services and verbal consent obtained.   The patient verbalized understanding of instructions provided today and declined  a print copy of patient instruction materials.   Follow up with provider re: inhaler therapy  Harlow Asa, PharmD, [place] [phone]

## 2018-09-12 NOTE — Patient Instructions (Signed)
Thank you allowing the Chronic Care Management Team to be a part of your care! It was a pleasure speaking with you today!     CCM (Chronic Care Management) Team    Janci Minor RN, BSN Nurse Care Coordinator  [phone]   Harlow Asa PharmD  Clinical Pharmacist  [phone]   Eula Fried LCSW Clinical Social Worker [phone]  Visit Information  Goals Addressed            This Visit's Progress   . "My inhaler copayment is $100/month"       Current Barriers:  Marland Kitchen Knowledge Deficits related to Part D plan formulary and confusion regarding instruction on pulmonary medications . Financial Barriers  Pharmacist Clinical Goal(s):  Marland Kitchen Over the next 30 days, patient will work with CM Pharmacist to address needs related to medication regimen optimization and medication cost  Interventions: . Coordination of care call to [facility] Pulmonology to recommend switch from Combivent to [facility] for improved cost and convenience to patient - Leave message with Caryl Pina in office o Also request confirmation that patient is to use Duoneb nebulizer solution three times daily as needed (as per provider's note from 03/20/18), rather than on scheduled basis four times daily written on prescription labeling. o Receive call back from [place], [place] with Dr. Lanney Gins. Caryl Pina states, per Dr. Lanney Gins: Faythe Ghee to change Combivent to [facility], 2 puffs twice daily (Prescription sent to Pharmacy) - Patient to continue to use Duoneb nebulizer solution four times daily as needed - prescription direction written as scheduled due to Part B billing issue. . Call to follow up with [facility]. Confirm Bevespi copayment consistent with patient's health plan tier 3 payment . Follow up with patient.  o Advise that Dr. Lanney Gins agreed to switch patient from Combivent to [facility], 2 puffs by mouth twice daily for improvement in cost and convenience. Also advise patient that I confirmed with Pulmonology that  ipratropium/albuterol nebulizer solution is to be used four times daily as needed - Mrs. Lofaro demonstrates understanding via teach-back method. Marland Kitchen Counsel patient on new Bevespi inhaler including spelling of name, instructions for use, administration instructions and how to access online "how to use" video from manufacturer website.  . Schedule appointment with patient to complete medication review.   Patient Self Care Activities:  . Self administers medications as prescribed . Attends all scheduled provider appointments . Calls pharmacy for medication refills . Calls provider office for new concerns or questions  Please see past updates related to this goal by clicking on the "Past Updates" button in the selected goal         The patient verbalized understanding of instructions provided today and declined a print copy of patient instruction materials.   Telephone follow up appointment with care management team member scheduled for: 7/13 at 1 pm  Harlow Asa, PharmD, [place] [phone]

## 2018-09-12 NOTE — Chronic Care Management (AMB) (Signed)
  Chronic Care Management   Follow Up Note   09/12/2018 Name: Taylor Griffin MRN: [id number] DOB: November 03, 1950  Referred by: Taylor Patch, MD Reason for referral : Care Coordination [facility] Pulmonology)   Taylor Griffin is a 68 y.o. year old female who is a primary care patient of Taylor Patch, MD. The CCM team was consulted for assistance with chronic disease management and care coordination needs.  Taylor Griffin has a past medical history including but not limited to hypertension, hyperlipidemia, anemia and COPD.  Place coordination of care call to [facility] [place] call to Taylor Griffin  Review of patient status, including review of consultants reports, relevant laboratory and other test results, and collaboration with appropriate care team members and the patient's provider was performed as part of comprehensive patient evaluation and provision of chronic care management services.    Goals Addressed            This Visit's Progress   . "My inhaler copayment is $100/month"       Current Barriers:  Marland Kitchen Knowledge Deficits related to Part D plan formulary and confusion regarding instruction on pulmonary medications . Financial Barriers  Pharmacist Clinical Goal(s):  Marland Kitchen Over the next 30 days, patient will work with CM Pharmacist to address needs related to medication regimen optimization and medication cost  Interventions: . Coordination of care call to [facility] Pulmonology to recommend switch from Combivent to [facility] for improved cost and convenience to patient - Leave message with Taylor Griffin in office o Also request confirmation that patient is to use Duoneb nebulizer solution three times daily as needed (as per provider's note from 03/20/18), rather than on scheduled basis four times daily written on prescription labeling. o Receive call back from Taylor Griffin, [place] with Taylor Griffin. Taylor Griffin states, per Taylor Griffin: Faythe Ghee to  change Combivent to [facility], 2 puffs twice daily (Prescription sent to Pharmacy) - Patient to continue to use Duoneb nebulizer solution four times daily as needed - prescription direction written as scheduled due to Part B billing issue. . Call to follow up with [facility]. Confirm Bevespi copayment consistent with patient's health plan tier 3 payment . Follow up with patient.  o Advise that Taylor Griffin agreed to switch patient from Combivent to [facility], 2 puffs by mouth twice daily for improvement in cost and convenience. Also advise patient that I confirmed with Pulmonology that ipratropium/albuterol nebulizer solution is to be used four times daily as needed - Taylor Griffin demonstrates understanding via teach-back method. Marland Kitchen Counsel patient on new Bevespi inhaler including spelling of name, instructions for use, administration instructions and how to access online "how to use" video from manufacturer website.  . Schedule appointment with patient to complete medication review.   Patient Self Care Activities:  . Self administers medications as prescribed . Attends all scheduled provider appointments . Calls pharmacy for medication refills . Calls provider office for new concerns or questions  Please see past updates related to this goal by clicking on the "Past Updates" button in the selected goal         Plan  Telephone follow up appointment with care management team member scheduled for: 7/13 at 1 pm to complete medication review.  Taylor Griffin, PharmD, [place] [phone]

## 2018-09-18 ENCOUNTER — Ambulatory Visit: Payer: Self-pay | Admitting: Pharmacist

## 2018-09-18 DIAGNOSIS — J432 Centrilobular emphysema: Secondary | ICD-10-CM

## 2018-09-18 DIAGNOSIS — I10 Essential (primary) hypertension: Secondary | ICD-10-CM

## 2018-09-18 DIAGNOSIS — E785 Hyperlipidemia, unspecified: Secondary | ICD-10-CM

## 2018-09-18 NOTE — Chronic Care Management (AMB) (Signed)
  Chronic Care Management   Follow Up Note   09/18/2018 Name: Rebekkah [place] Keator MRN: [id number] DOB: 07/03/1950  Referred by: Juline Patch, MD Reason for referral : Chronic Care Management (Patient Phone Call)   Lynsee [place] Tamm is a 68 y.o. year old female who is a primary care patient of Juline Patch, MD. The CCM team was consulted for assistance with chronic disease management and care coordination needs.  Ms. Mawhinney has a past medical history including but not limited to hypertension, hyperlipidemia, anemia and COPD.  Outreach call to Alzena [place] Sutphin as scheduled to complete medication review.   Review of patient status, including review of consultants reports, relevant laboratory and other test results, and collaboration with appropriate care team members and the patient's provider was performed as part of comprehensive patient evaluation and provision of chronic care management services.    Goals Addressed            This Visit's Progress   . COMPLETED: "My inhaler copayment is $100/month"       Current Barriers:  Marland Kitchen Knowledge Deficits related to Part D plan formulary and confusion regarding instruction on pulmonary medications . Financial Barriers  Pharmacist Clinical Goal(s):  Marland Kitchen Over the next 30 days, patient will work with CM Pharmacist to address needs related to medication regimen optimization and medication cost  Interventions: . Perform comprehensive medication review o Reports that she is finishing up current supply of Combivent prior to start of taking Bevespi as directed by Dr. Lanney Gins. - Denies questions regarding how to use Bevespi inhaler; confirms having viewed the "how to use" video from manufacturer website  o Confirms using ipratropium-albuterol nebulizer solution now on an as needed basis as directed by Pulmonologist . Counsel on importance of medication adherence o Reports using weekly pillbox (fills on Sundays) and daily  alarms as adherence aids o Denies any current missed doses . Counsel on importance of blood pressure control o Reports having upper arm blood pressure monitors both at home and at work o Encourage patient to check blood pressure, particularly when not feeling well, and keep log of readings. To follow up with PCP as needed for abnormal readings  Patient Self Care Activities:  . Self administers medications as prescribed . Attends all scheduled provider appointments . Calls pharmacy for medication refills . Calls provider office for new concerns or questions  Please see past updates related to this goal by clicking on the "Past Updates" button in the selected goal         Plan  No further follow up required: Ms. Wedel denies any further medication questions or concerns at this time. The patient has been provided with contact information for the CM Pharmacist and has been advised to call with any health related questions or concerns.    Harlow Asa, PharmD, [place] [phone]

## 2018-09-18 NOTE — Patient Instructions (Signed)
Thank you allowing the Chronic Care Management Team to be a part of your care! It was a pleasure speaking with you today!     CCM (Chronic Care Management) Team    Janci Minor RN, BSN Nurse Care Coordinator  [phone]   Harlow Asa PharmD  Clinical Pharmacist  [phone]   Eula Fried LCSW Clinical Social Worker [phone]  Visit Information  Goals Addressed            This Visit's Progress   . COMPLETED: "My inhaler copayment is $100/month"       Current Barriers:  Marland Kitchen Knowledge Deficits related to Part D plan formulary and confusion regarding instruction on pulmonary medications . Financial Barriers  Pharmacist Clinical Goal(s):  Marland Kitchen Over the next 30 days, patient will work with CM Pharmacist to address needs related to medication regimen optimization and medication cost  Interventions: . Perform comprehensive medication review o Reports that she is finishing up current supply of Combivent prior to start of taking Bevespi as directed by Dr. Lanney Gins. - Denies questions regarding how to use Bevespi inhaler; confirms having viewed the "how to use" video from manufacturer website  o Confirms using ipratropium-albuterol nebulizer solution now on an as needed basis as directed by Pulmonologist . Counsel on importance of medication adherence o Reports using weekly pillbox (fills on Sundays) and daily alarms as adherence aids o Denies any current missed doses . Counsel on importance of blood pressure control o Reports having upper arm blood pressure monitors both at home and at work o Encourage patient to check blood pressure, particularly when not feeling well, and keep log of readings. To follow up with PCP as needed for abnormal readings  Patient Self Care Activities:  . Self administers medications as prescribed . Attends all scheduled provider appointments . Calls pharmacy for medication refills . Calls provider office for new concerns or  questions  Please see past updates related to this goal by clicking on the "Past Updates" button in the selected goal         The patient verbalized understanding of instructions provided today and declined a print copy of patient instruction materials.   No further follow up required: Ms. Fairclough denies any further medication questions or concerns at this time. The patient has been provided with contact information for the CM Pharmacist and has been advised to call with any health related questions or concerns  Harlow Asa, PharmD, [place] [phone]

## 2018-09-27 ENCOUNTER — Other Ambulatory Visit: Payer: Self-pay | Admitting: Family Medicine

## 2018-09-27 DIAGNOSIS — I10 Essential (primary) hypertension: Secondary | ICD-10-CM

## 2018-10-10 ENCOUNTER — Ambulatory Visit
Admission: RE | Admit: 2018-10-10 | Discharge: 2018-10-10 | Disposition: A | Discharge status: Home / Self Care | Payer: Medicare HMO | Source: Ambulatory Visit | Attending: Family Medicine | Admitting: Family Medicine

## 2018-10-10 ENCOUNTER — Other Ambulatory Visit: Payer: Self-pay

## 2018-10-10 DIAGNOSIS — M8589 Other specified disorders of bone density and structure, multiple sites: Secondary | ICD-10-CM | POA: Diagnosis not present

## 2018-10-10 DIAGNOSIS — Z1231 Encounter for screening mammogram for malignant neoplasm of breast: Secondary | ICD-10-CM | POA: Diagnosis not present

## 2018-10-10 DIAGNOSIS — M810 Age-related osteoporosis without current pathological fracture: Secondary | ICD-10-CM | POA: Insufficient documentation

## 2018-10-10 DIAGNOSIS — M8580 Other specified disorders of bone density and structure, unspecified site: Secondary | ICD-10-CM

## 2018-10-10 DIAGNOSIS — Z780 Asymptomatic menopausal state: Secondary | ICD-10-CM

## 2018-10-13 DIAGNOSIS — M7989 Other specified soft tissue disorders: Secondary | ICD-10-CM | POA: Diagnosis not present

## 2018-10-13 DIAGNOSIS — M79645 Pain in left finger(s): Secondary | ICD-10-CM | POA: Diagnosis not present

## 2018-10-18 DIAGNOSIS — M25642 Stiffness of left hand, not elsewhere classified: Secondary | ICD-10-CM | POA: Diagnosis not present

## 2018-10-18 DIAGNOSIS — M79645 Pain in left finger(s): Secondary | ICD-10-CM | POA: Diagnosis not present

## 2018-10-27 DIAGNOSIS — M79645 Pain in left finger(s): Secondary | ICD-10-CM | POA: Diagnosis not present

## 2018-11-19 IMAGING — US US ABDOMEN COMPLETE
1 series · 14 of 25 positions shown · non-contrast
Comparison: None.

CLINICAL DATA: Generalized abdominal pain

EXAM:
ABDOMEN ULTRASOUND COMPLETE

[Series 1: us abdomen complete · 0.19mm/px · 14 of 100 slices shown]
[im 1/100]
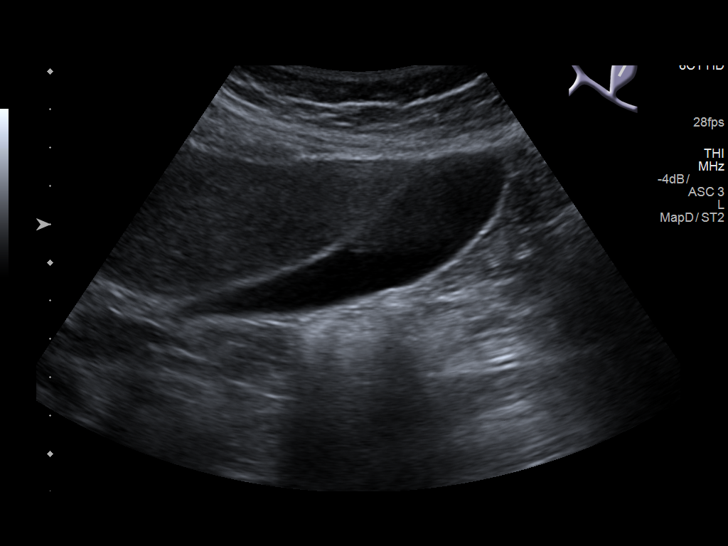
[im 9/100]
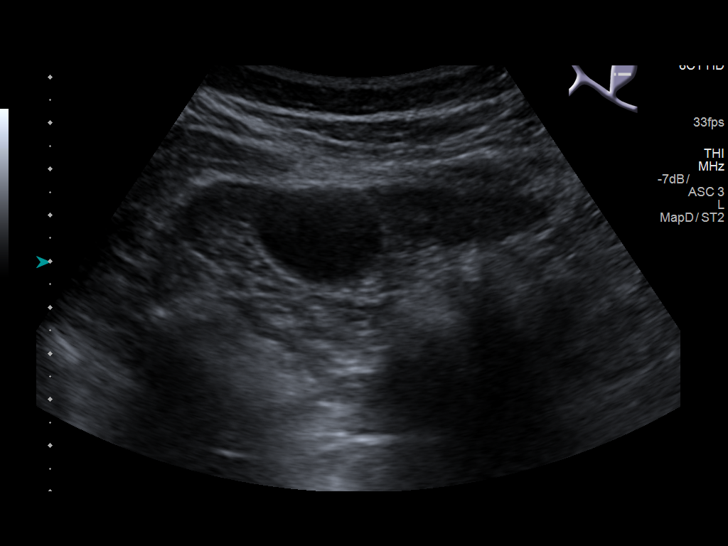
[im 17/100]
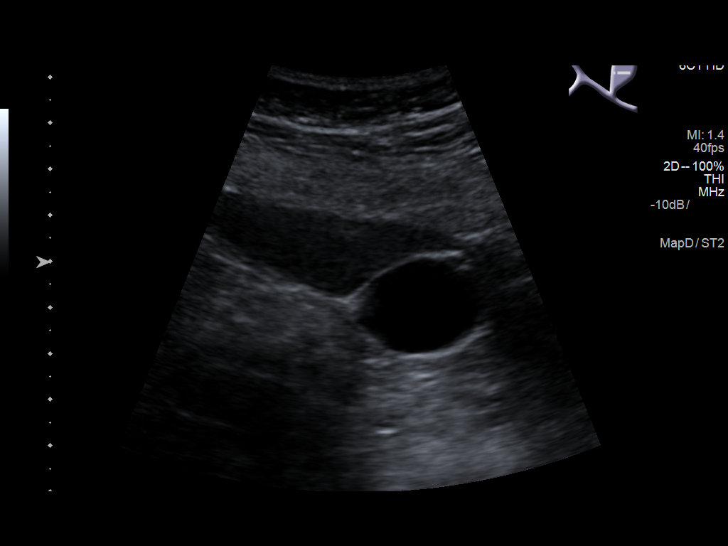
[im 25/100]
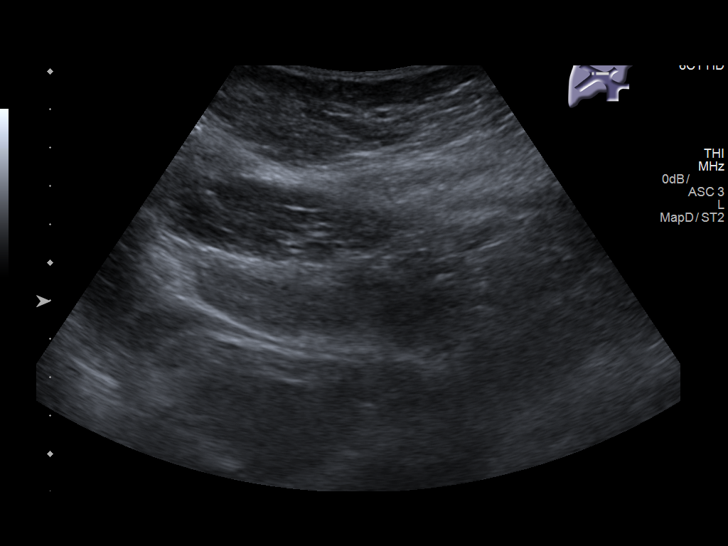
[im 34/100]
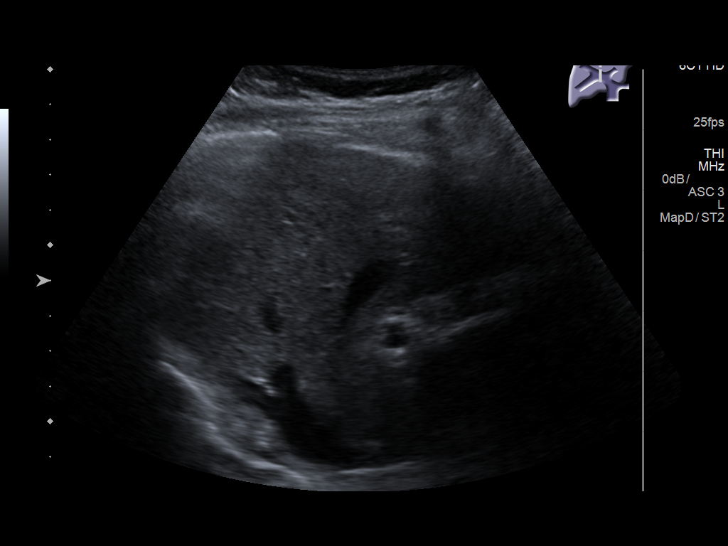
[im 38/100]
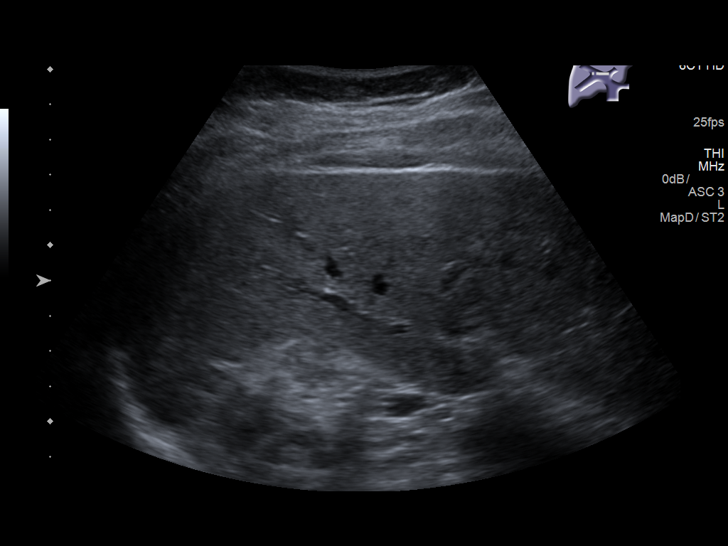
[im 46/100]
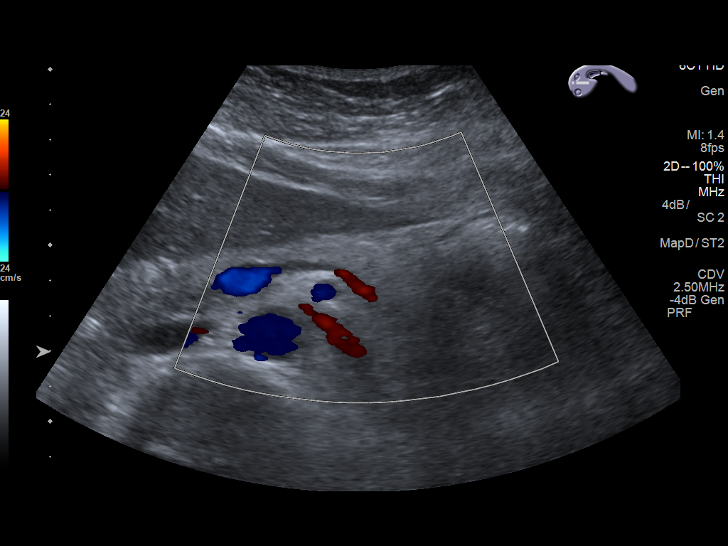
[im 54/100]
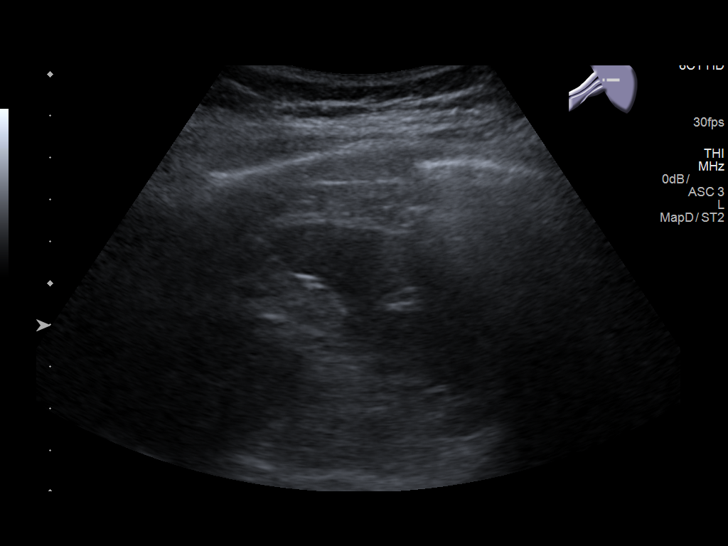
[im 62/100]
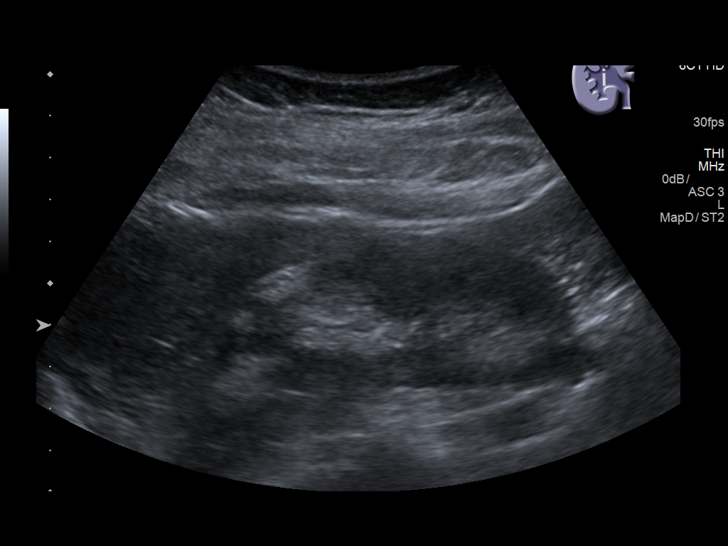
[im 67/100]
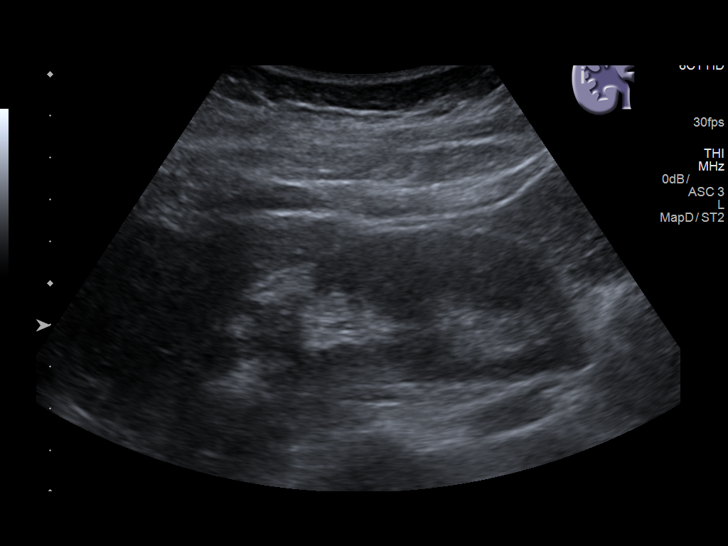
[im 75/100]
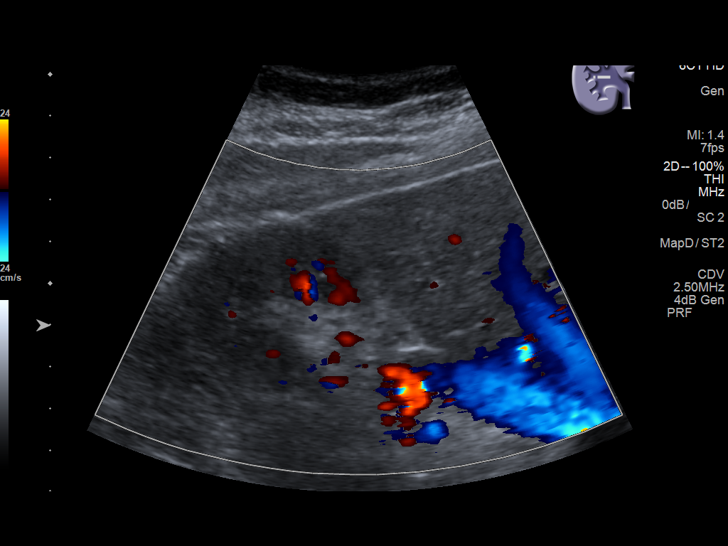
[im 83/100]
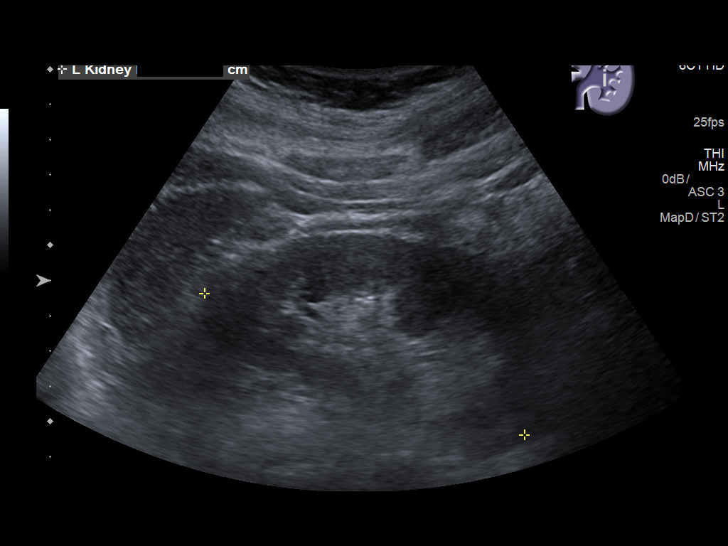
[im 91/100]
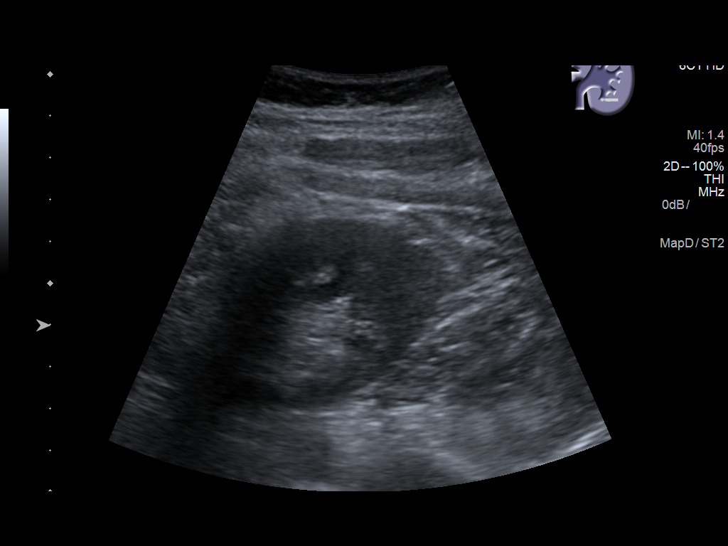
[im 100/100]
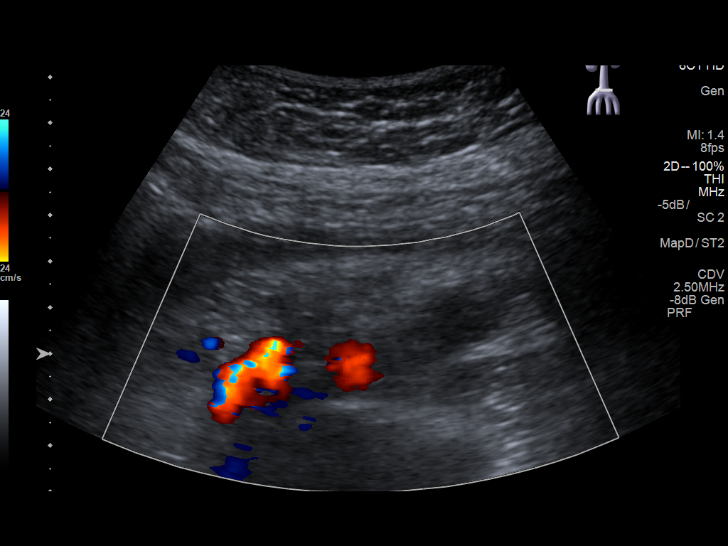

[14 of 25 positions shown; findings below may reference images not displayed]

FINDINGS: Gallbladder: No gallstones or wall thickening visualized. There is
no pericholecystic fluid. No sonographic Murphy sign noted by
sonographer.

Common bile duct: Diameter: 5 mm. No intrahepatic, common hepatic,
or common bile duct dilatation.

Liver: No focal lesion identified. Within normal limits in
parenchymal echogenicity. Portal vein is patent on color Doppler
imaging with normal direction of blood flow towards the liver.

IVC: No abnormality visualized.

Pancreas: No pancreatic mass or inflammatory focus.

Spleen: Size and appearance within normal limits.

Right Kidney: Length: 9.1 cm. Echogenicity within normal limits. No
mass or hydronephrosis visualized. There is scarring in the upper
pole right kidney region.

Left Kidney: Length: 9.9 cm. Echogenicity within normal limits. No
mass or hydronephrosis visualized.

Abdominal aorta: No aneurysm visualized.

Other findings: No demonstrable ascites.
IMPRESSION: Scarring upper pole right kidney region.

Study otherwise unremarkable.

## 2018-11-20 ENCOUNTER — Other Ambulatory Visit: Payer: Self-pay | Admitting: Family Medicine

## 2018-11-20 DIAGNOSIS — I10 Essential (primary) hypertension: Secondary | ICD-10-CM

## 2018-11-24 DIAGNOSIS — S139XXA Sprain of joints and ligaments of unspecified parts of neck, initial encounter: Secondary | ICD-10-CM | POA: Diagnosis not present

## 2018-11-24 DIAGNOSIS — M7582 Other shoulder lesions, left shoulder: Secondary | ICD-10-CM | POA: Diagnosis not present

## 2018-11-24 DIAGNOSIS — S335XXA Sprain of ligaments of lumbar spine, initial encounter: Secondary | ICD-10-CM | POA: Diagnosis not present

## 2018-12-08 DIAGNOSIS — S139XXA Sprain of joints and ligaments of unspecified parts of neck, initial encounter: Secondary | ICD-10-CM | POA: Diagnosis not present

## 2018-12-08 DIAGNOSIS — M7582 Other shoulder lesions, left shoulder: Secondary | ICD-10-CM | POA: Diagnosis not present

## 2018-12-08 DIAGNOSIS — M4302 Spondylolysis, cervical region: Secondary | ICD-10-CM | POA: Diagnosis not present

## 2018-12-08 DIAGNOSIS — S335XXA Sprain of ligaments of lumbar spine, initial encounter: Secondary | ICD-10-CM | POA: Diagnosis not present

## 2018-12-08 DIAGNOSIS — M4306 Spondylolysis, lumbar region: Secondary | ICD-10-CM | POA: Diagnosis not present

## 2018-12-25 ENCOUNTER — Ambulatory Visit (INDEPENDENT_AMBULATORY_CARE_PROVIDER_SITE_OTHER): Payer: Medicare HMO

## 2018-12-25 ENCOUNTER — Other Ambulatory Visit: Payer: Self-pay

## 2018-12-25 DIAGNOSIS — Z23 Encounter for immunization: Secondary | ICD-10-CM

## 2019-01-04 ENCOUNTER — Other Ambulatory Visit: Payer: Self-pay | Admitting: Family Medicine

## 2019-01-04 DIAGNOSIS — E782 Mixed hyperlipidemia: Secondary | ICD-10-CM

## 2019-01-05 DIAGNOSIS — S335XXA Sprain of ligaments of lumbar spine, initial encounter: Secondary | ICD-10-CM | POA: Diagnosis not present

## 2019-01-05 DIAGNOSIS — M4302 Spondylolysis, cervical region: Secondary | ICD-10-CM | POA: Diagnosis not present

## 2019-01-05 DIAGNOSIS — M4306 Spondylolysis, lumbar region: Secondary | ICD-10-CM | POA: Diagnosis not present

## 2019-01-05 DIAGNOSIS — S139XXA Sprain of joints and ligaments of unspecified parts of neck, initial encounter: Secondary | ICD-10-CM | POA: Diagnosis not present

## 2019-01-05 DIAGNOSIS — M7582 Other shoulder lesions, left shoulder: Secondary | ICD-10-CM | POA: Diagnosis not present

## 2019-02-13 DIAGNOSIS — Z809 Family history of malignant neoplasm, unspecified: Secondary | ICD-10-CM | POA: Diagnosis not present

## 2019-02-13 DIAGNOSIS — Z79899 Other long term (current) drug therapy: Secondary | ICD-10-CM | POA: Diagnosis not present

## 2019-02-13 DIAGNOSIS — J449 Chronic obstructive pulmonary disease, unspecified: Secondary | ICD-10-CM | POA: Diagnosis not present

## 2019-02-13 DIAGNOSIS — H547 Unspecified visual loss: Secondary | ICD-10-CM | POA: Diagnosis not present

## 2019-02-13 DIAGNOSIS — Z7982 Long term (current) use of aspirin: Secondary | ICD-10-CM | POA: Diagnosis not present

## 2019-02-13 DIAGNOSIS — J309 Allergic rhinitis, unspecified: Secondary | ICD-10-CM | POA: Diagnosis not present

## 2019-02-13 DIAGNOSIS — M1990 Unspecified osteoarthritis, unspecified site: Secondary | ICD-10-CM | POA: Diagnosis not present

## 2019-02-13 DIAGNOSIS — I10 Essential (primary) hypertension: Secondary | ICD-10-CM | POA: Diagnosis not present

## 2019-02-13 DIAGNOSIS — G8929 Other chronic pain: Secondary | ICD-10-CM | POA: Diagnosis not present

## 2019-02-13 DIAGNOSIS — Z791 Long term (current) use of non-steroidal anti-inflammatories (NSAID): Secondary | ICD-10-CM | POA: Diagnosis not present

## 2019-02-16 DIAGNOSIS — S139XXA Sprain of joints and ligaments of unspecified parts of neck, initial encounter: Secondary | ICD-10-CM | POA: Diagnosis not present

## 2019-02-16 DIAGNOSIS — M7582 Other shoulder lesions, left shoulder: Secondary | ICD-10-CM | POA: Diagnosis not present

## 2019-02-16 DIAGNOSIS — M4306 Spondylolysis, lumbar region: Secondary | ICD-10-CM | POA: Diagnosis not present

## 2019-02-16 DIAGNOSIS — S335XXA Sprain of ligaments of lumbar spine, initial encounter: Secondary | ICD-10-CM | POA: Diagnosis not present

## 2019-02-16 DIAGNOSIS — M4302 Spondylolysis, cervical region: Secondary | ICD-10-CM | POA: Diagnosis not present

## 2019-02-19 ENCOUNTER — Encounter: Payer: Self-pay | Admitting: Family Medicine

## 2019-02-19 ENCOUNTER — Ambulatory Visit (INDEPENDENT_AMBULATORY_CARE_PROVIDER_SITE_OTHER): Payer: Medicare HMO | Admitting: Family Medicine

## 2019-02-19 ENCOUNTER — Other Ambulatory Visit: Payer: Self-pay

## 2019-02-19 DIAGNOSIS — D529 Folate deficiency anemia, unspecified: Secondary | ICD-10-CM | POA: Diagnosis not present

## 2019-02-19 DIAGNOSIS — E782 Mixed hyperlipidemia: Secondary | ICD-10-CM

## 2019-02-19 DIAGNOSIS — I10 Essential (primary) hypertension: Secondary | ICD-10-CM | POA: Diagnosis not present

## 2019-02-19 MED ORDER — HYDROCHLOROTHIAZIDE 25 MG PO TABS: 25.0000 mg | ORAL_TABLET | Freq: Every day | ORAL | 1 refills | Status: DC

## 2019-02-19 MED ORDER — PRAVASTATIN SODIUM 40 MG PO TABS: ORAL_TABLET | ORAL | 1 refills | Status: DC

## 2019-02-19 MED ORDER — FOLIC ACID 800 MCG PO TABS: ORAL_TABLET | ORAL | 1 refills | Status: DC

## 2019-02-19 MED ORDER — METOPROLOL SUCCINATE ER 50 MG PO TB24: 50.0000 mg | ORAL_TABLET | Freq: Every day | ORAL | 1 refills | Status: DC

## 2019-02-19 MED ORDER — LOSARTAN POTASSIUM 100 MG PO TABS: 100.0000 mg | ORAL_TABLET | Freq: Every day | ORAL | 1 refills | Status: DC

## 2019-02-19 NOTE — Progress Notes (Signed)
Date:  02/19/2019   Name:  Taylor Griffin   DOB:  [birth year]   MRN:  [id number]   Chief Complaint: Anemia, Hyperlipidemia, and Hypertension  Anemia Presents for follow-up visit. There has been no abdominal pain, anorexia, bruising/bleeding easily, confusion, fever, leg swelling, light-headedness, malaise/fatigue, pallor, palpitations, paresthesias or weight loss. Signs of blood loss that are not present include hematemesis, hematochezia, melena and menorrhagia. There is no history of chronic renal disease, heart failure or hypothyroidism. There are no compliance problems.   Hyperlipidemia This is a chronic problem. The current episode started more than 1 year ago. The problem is controlled. Recent lipid tests were reviewed and are normal. She has no history of chronic renal disease, diabetes, hypothyroidism, liver disease, obesity or nephrotic syndrome. Factors aggravating her hyperlipidemia include thiazides. Pertinent negatives include no chest pain, myalgias or shortness of breath. Current antihyperlipidemic treatment includes statins. There are no compliance problems.   Hypertension This is a chronic problem. The current episode started more than 1 year ago. The problem has been gradually improving since onset. The problem is controlled. Pertinent negatives include no anxiety, blurred vision, chest pain, headaches, malaise/fatigue, neck pain, orthopnea, palpitations, peripheral edema, PND, shortness of breath or sweats. There are no associated agents to hypertension. Risk factors for coronary artery disease include dyslipidemia. Past treatments include angiotensin blockers, diuretics and beta blockers. The current treatment provides moderate improvement. There are no compliance problems.  There is no history of angina, kidney disease, CAD/MI, CVA, heart failure, left ventricular hypertrophy, PVD or retinopathy. There is no history of chronic renal disease, a hypertension causing med or  renovascular disease.    Lab Results  Component Value Date   CREATININE 1.19 (H) 08/21/2018   BUN 22 08/21/2018   NA 139 08/21/2018   K 4.3 08/21/2018   CL 96 08/21/2018   CO2 24 08/21/2018   Lab Results  Component Value Date   CHOL 194 08/21/2018   HDL 42 08/21/2018   LDLCALC 127 (H) 08/21/2018   TRIG 126 08/21/2018   CHOLHDL 5.2 (H) 07/11/2017   No results found for: TSH No results found for: HGBA1C   Review of Systems  Constitutional: Negative for chills, fever, malaise/fatigue and weight loss.  HENT: Negative for drooling, ear discharge, ear pain and sore throat.   Eyes: Negative for blurred vision.  Respiratory: Negative for cough, shortness of breath and wheezing.   Cardiovascular: Negative for chest pain, palpitations, orthopnea, leg swelling and PND.  Gastrointestinal: Negative for abdominal pain, anorexia, blood in stool, constipation, diarrhea, hematemesis, hematochezia, melena and nausea.  Endocrine: Negative for polydipsia.  Genitourinary: Negative for dysuria, frequency, hematuria, menorrhagia and urgency.  Musculoskeletal: Negative for back pain, myalgias and neck pain.  Skin: Negative for pallor and rash.  Allergic/Immunologic: Negative for environmental allergies.  Neurological: Negative for dizziness, light-headedness, headaches and paresthesias.  Hematological: Does not bruise/bleed easily.  Psychiatric/Behavioral: Negative for confusion and suicidal ideas. The patient is not nervous/anxious.     Patient Active Problem List   Diagnosis Date Noted  . Centrilobular emphysema ([place]) 08/21/2018  . Dyshidrotic eczema 07/11/2017  . Hyperlipidemia, unspecified 05/03/2016  . Hypertension 05/03/2016    Allergies  Allergen Reactions  . Latex Itching    Past Surgical History:  Procedure Laterality Date  . COLONOSCOPY WITH PROPOFOL N/A 11/22/2014   Procedure: COLONOSCOPY WITH PROPOFOL;  Surgeon: Lucilla Lame, MD;  Location: [facility];  Service:  Endoscopy;  Laterality: N/A;  LATEX ALLERGY  . ECTOPIC PREGNANCY  SURGERY    . ESOPHAGOGASTRODUODENOSCOPY (EGD) WITH PROPOFOL N/A 01/26/2017   Procedure: ESOPHAGOGASTRODUODENOSCOPY (EGD) WITH PROPOFOL;  Surgeon: Lin Landsman, MD;  Location: [facility];  Service: Endoscopy;  Laterality: N/A;    Social History   Tobacco Use  . Smoking status: Former Smoker    Packs/day: 0.50    Years: 18.00    Pack years: 9.00    Types: Cigarettes  . Smokeless tobacco: Never Used  Substance Use Topics  . Alcohol use: No    Alcohol/week: 0.0 standard drinks  . Drug use: No     Medication list has been reviewed and updated.  Current Meds  Medication Sig  . aspirin 81 MG tablet Take 1 tablet (81 mg total) by mouth daily. AM  . Calcium Carbonate-Vit D-Min (CALCIUM 1200 PO) Take 1 tablet by mouth daily.  . fluticasone (FLONASE) 50 MCG/ACT nasal spray Place 2 sprays into the nose daily. Dr A  . folic acid ([facility] FOLIC ACID) Q000111Q MCG tablet TAKE 1 TABLET DAILY. PT NEEDS TO TAKE 1 WHOLE PILL, NOT THE HALF- PLEASE D/C THE HALF TABLET  . Glycopyrrolate-Formoterol 9-4.8 MCG/ACT AERO Inhale 2 puffs into the lungs 2 (two) times a day.  . hydrochlorothiazide (HYDRODIURIL) 25 MG tablet Take 1 tablet (25 mg total) by mouth daily.  Marland Kitchen ipratropium-albuterol (DUONEB) 0.5-2.5 (3) MG/3ML SOLN Inhale 3 mLs into the lungs 3 (three) times daily as needed. Dr A  . losartan (COZAAR) 100 MG tablet TAKE 1 TABLET BY MOUTH EVERY DAY  . metoprolol succinate (TOPROL-XL) 50 MG 24 hr tablet Take 1 tablet (50 mg total) by mouth daily. Take with or immediately following a meal.  . Multiple Vitamins-Minerals (MULTIVITAMIN WITH MINERALS) tablet Take 1 tablet by mouth daily.  . pravastatin (PRAVACHOL) 40 MG tablet TAKE 1 TABET BY MOUTH EVERY DAY    PHQ 2/9 Scores 02/19/2019 09/06/2018 08/21/2018 08/21/2018  PHQ - 2 Score 0 0 0 0  PHQ- 9 Score 0 - 0 0    BP Readings from Last 3 Encounters:  02/19/19 120/64  09/06/18  109/76  08/21/18 130/80    Physical Exam Vitals and nursing note reviewed.  Constitutional:      General: She is not in acute distress.    Appearance: She is not diaphoretic.  HENT:     Head: Normocephalic and atraumatic.     Right Ear: Tympanic membrane, ear canal and external ear normal.     Left Ear: Tympanic membrane, ear canal and external ear normal.     Nose: Nose normal.  Eyes:     General:        Right eye: No discharge.        Left eye: No discharge.     Conjunctiva/sclera: Conjunctivae normal.     Pupils: Pupils are equal, round, and reactive to light.  Neck:     Thyroid: No thyromegaly.     Vascular: No JVD.  Cardiovascular:     Rate and Rhythm: Normal rate and regular rhythm.     Heart sounds: Normal heart sounds. No murmur. No friction rub. No gallop.   Pulmonary:     Effort: Pulmonary effort is normal.     Breath sounds: Normal breath sounds.  Abdominal:     General: Bowel sounds are normal.     Palpations: Abdomen is soft. There is no mass.     Tenderness: There is no abdominal tenderness. There is no guarding.  Musculoskeletal:        General:  Normal range of motion.     Cervical back: Normal range of motion and neck supple.  Lymphadenopathy:     Cervical: No cervical adenopathy.  Skin:    General: Skin is warm and dry.     Capillary Refill: Capillary refill takes less than 2 seconds.  Neurological:     Mental Status: She is alert.     Deep Tendon Reflexes: Reflexes are normal and symmetric.     Wt Readings from Last 3 Encounters:  02/19/19 161 lb (73 kg)  09/06/18 161 lb (73 kg)  08/21/18 161 lb (73 kg)    BP 120/64   Pulse 64   Ht 5\' 7"  (1.702 m)   Wt 161 lb (73 kg)   BMI 25.22 kg/m   Assessment and Plan: 1. Anemia due to folic acid deficiency, unspecified deficiency type Chronic.  Controlled.  Stable.  Will continue folic acid Q000111Q mcg 1 daily.  Will check CBC for indices and hemoglobin. - CBC w/Diff/Platelet - folic acid ([facility] FOLIC  ACID) Q000111Q MCG tablet; TAKE 1 TABLET DAILY. PT NEEDS TO TAKE 1 WHOLE PILL  Dispense: 90 tablet; Refill: 1  2. Essential hypertension Chronic.  Controlled.  Stable.  Continue hydrochlorothiazide 25 mg, losartan 100 mg, and metoprolol XL 50 mg once a day.  Will check renal function panel to excess renal function and GFR. - Renal Function Panel - hydrochlorothiazide (HYDRODIURIL) 25 MG tablet; Take 1 tablet (25 mg total) by mouth daily.  Dispense: 90 tablet; Refill: 1 - losartan (COZAAR) 100 MG tablet; Take 1 tablet (100 mg total) by mouth daily.  Dispense: 90 tablet; Refill: 1 - metoprolol succinate (TOPROL-XL) 50 MG 24 hr tablet; Take 1 tablet (50 mg total) by mouth daily. Take with or immediately following a meal.  Dispense: 90 tablet; Refill: 1  3. Mixed hyperlipidemia Chronic.  Controlled.  Stable.  Continue pravastatin 40 mg once a day.  Reviewed previous lipid panel which was in normal range. - pravastatin (PRAVACHOL) 40 MG tablet; TAKE 1 TABET BY MOUTH EVERY DAY  Dispense: 90 tablet; Refill: 1

## 2019-02-20 LAB — CBC WITH DIFFERENTIAL/PLATELET
Basophils Absolute: 0.1 10*3/uL (ref 0.0–0.2)
Basos: 1 %
EOS (ABSOLUTE): 0.2 10*3/uL (ref 0.0–0.4)
Eos: 5 %
Hematocrit: 42.2 % (ref 34.0–46.6)
Hemoglobin: 14.6 g/dL (ref 11.1–15.9)
Immature Grans (Abs): 0 10*3/uL (ref 0.0–0.1)
Immature Granulocytes: 0 %
Lymphocytes Absolute: 2.4 10*3/uL (ref 0.7–3.1)
Lymphs: 44 %
MCH: 31.6 pg (ref 26.6–33.0)
MCHC: 34.6 g/dL (ref 31.5–35.7)
MCV: 91 fL (ref 79–97)
Monocytes Absolute: 0.5 10*3/uL (ref 0.1–0.9)
Monocytes: 10 %
Neutrophils Absolute: 2.1 10*3/uL (ref 1.4–7.0)
Neutrophils: 40 %
Platelets: 308 10*3/uL (ref 150–450)
RBC: 4.62 x10E6/uL (ref 3.77–5.28)
RDW: 13.1 % (ref 11.7–15.4)
WBC: 5.3 10*3/uL (ref 3.4–10.8)

## 2019-02-20 LAB — RENAL FUNCTION PANEL
Albumin: 4.4 g/dL (ref 3.8–4.8)
BUN/Creatinine Ratio: 19 (ref 12–28)
BUN: 23 mg/dL (ref 8–27)
CO2: 23 mmol/L (ref 20–29)
Calcium: 10.2 mg/dL (ref 8.7–10.3)
Chloride: 102 mmol/L (ref 96–106)
Creatinine, Ser: 1.2 mg/dL — ABNORMAL HIGH (ref 0.57–1.00)
GFR calc Af Amer: 53 mL/min/{1.73_m2} — ABNORMAL LOW (ref >59)
GFR calc non Af Amer: 46 mL/min/{1.73_m2} — ABNORMAL LOW (ref >59)
Glucose: 84 mg/dL (ref 65–99)
Phosphorus: 4.3 mg/dL (ref 3.0–4.3)
Potassium: 4.4 mmol/L (ref 3.5–5.2)
Sodium: 143 mmol/L (ref 134–144)

## 2019-05-17 DIAGNOSIS — H16223 Keratoconjunctivitis sicca, not specified as Sjogren's, bilateral: Secondary | ICD-10-CM | POA: Diagnosis not present

## 2019-05-17 DIAGNOSIS — H524 Presbyopia: Secondary | ICD-10-CM | POA: Diagnosis not present

## 2019-05-17 DIAGNOSIS — H2513 Age-related nuclear cataract, bilateral: Secondary | ICD-10-CM | POA: Diagnosis not present

## 2019-05-17 DIAGNOSIS — H52223 Regular astigmatism, bilateral: Secondary | ICD-10-CM | POA: Diagnosis not present

## 2019-05-17 DIAGNOSIS — H5203 Hypermetropia, bilateral: Secondary | ICD-10-CM | POA: Diagnosis not present

## 2019-05-30 DIAGNOSIS — R69 Illness, unspecified: Secondary | ICD-10-CM | POA: Diagnosis not present

## 2019-05-31 DIAGNOSIS — J449 Chronic obstructive pulmonary disease, unspecified: Secondary | ICD-10-CM | POA: Diagnosis not present

## 2019-08-20 ENCOUNTER — Encounter: Payer: Self-pay | Admitting: Family Medicine

## 2019-08-20 ENCOUNTER — Ambulatory Visit (INDEPENDENT_AMBULATORY_CARE_PROVIDER_SITE_OTHER): Payer: Medicare HMO | Admitting: Family Medicine

## 2019-08-20 ENCOUNTER — Other Ambulatory Visit: Payer: Self-pay

## 2019-08-20 DIAGNOSIS — E782 Mixed hyperlipidemia: Secondary | ICD-10-CM | POA: Diagnosis not present

## 2019-08-20 DIAGNOSIS — I10 Essential (primary) hypertension: Secondary | ICD-10-CM | POA: Diagnosis not present

## 2019-08-20 DIAGNOSIS — D529 Folate deficiency anemia, unspecified: Secondary | ICD-10-CM | POA: Diagnosis not present

## 2019-08-20 MED ORDER — LOSARTAN POTASSIUM 100 MG PO TABS: 100.0000 mg | ORAL_TABLET | Freq: Every day | ORAL | 1 refills | Status: DC

## 2019-08-20 MED ORDER — HYDROCHLOROTHIAZIDE 25 MG PO TABS: 25.0000 mg | ORAL_TABLET | Freq: Every day | ORAL | 1 refills | Status: DC

## 2019-08-20 MED ORDER — FOLIC ACID 800 MCG PO TABS: ORAL_TABLET | ORAL | 1 refills | Status: DC

## 2019-08-20 MED ORDER — METOPROLOL SUCCINATE ER 50 MG PO TB24: 50.0000 mg | ORAL_TABLET | Freq: Every day | ORAL | 1 refills | Status: DC

## 2019-08-20 NOTE — Progress Notes (Signed)
Date:  08/20/2019   Name:  Taylor Griffin   DOB:  September 20, 1950   MRN:  [id number]   Chief Complaint: Anemia, Hypertension, Hyperlipidemia (having cramps in legs), and Arm Pain (feels like her arms are "heavy"- "can't hardly brush my hair")  Anemia Presents for follow-up visit. There has been no abdominal pain, bruising/bleeding easily, fever, light-headedness, malaise/fatigue, pallor, palpitations or weight loss. Signs of blood loss that are not present include hematemesis, hematochezia, melena, menorrhagia and vaginal bleeding. There is no history of chronic renal disease, heart failure or hypothyroidism. There are no compliance problems.   Hypertension This is a chronic problem. The current episode started more than 1 year ago. The problem has been waxing and waning since onset. The problem is controlled. Pertinent negatives include no anxiety, blurred vision, headaches, malaise/fatigue, neck pain, orthopnea, palpitations, peripheral edema, PND, shortness of breath or sweats. There are no associated agents to hypertension. There are no known risk factors for coronary artery disease. Past treatments include angiotensin blockers, diuretics and beta blockers. The current treatment provides moderate improvement. There are no compliance problems.  There is no history of angina, kidney disease, CAD/MI, CVA, heart failure, left ventricular hypertrophy, PVD or retinopathy. There is no history of chronic renal disease, a hypertension causing med or renovascular disease.  Hyperlipidemia This is a chronic problem. The current episode started more than 1 year ago. The problem is controlled. Recent lipid tests were reviewed and are normal. She has no history of chronic renal disease, diabetes, hypothyroidism, liver disease, obesity or nephrotic syndrome. Factors aggravating her hyperlipidemia include thiazides. Pertinent negatives include no focal sensory loss, focal weakness, leg pain, myalgias or  shortness of breath. Current antihyperlipidemic treatment includes statins. The current treatment provides mild improvement of lipids. There are no compliance problems.   Muscle Pain This is a new problem. The current episode started more than 1 month ago. The problem occurs daily. The problem has been gradually worsening since onset. The pain is present in the left arm, left lower leg, right arm and right lower leg (cramps ). The pain is medium. Nothing aggravates the symptoms. Pertinent negatives include no abdominal pain, constipation, diarrhea, dysuria, eye pain, fatigue, fever, headaches, nausea, rash, shortness of breath, vaginal discharge, vomiting, weakness or wheezing. The treatment provided moderate relief.    Lab Results  Component Value Date   CREATININE 1.20 (H) 02/19/2019   BUN 23 02/19/2019   NA 143 02/19/2019   K 4.4 02/19/2019   CL 102 02/19/2019   CO2 23 02/19/2019   Lab Results  Component Value Date   CHOL 194 08/21/2018   HDL 42 08/21/2018   LDLCALC 127 (H) 08/21/2018   TRIG 126 08/21/2018   CHOLHDL 5.2 (H) 07/11/2017   No results found for: TSH No results found for: HGBA1C Lab Results  Component Value Date   WBC 5.3 02/19/2019   HGB 14.6 02/19/2019   HCT 42.2 02/19/2019   MCV 91 02/19/2019   PLT 308 02/19/2019   Lab Results  Component Value Date   ALT 25 08/21/2018   AST 39 08/21/2018   ALKPHOS 71 08/21/2018   BILITOT 0.5 08/21/2018     Review of Systems  Constitutional: Negative.  Negative for chills, fatigue, fever, malaise/fatigue, unexpected weight change and weight loss.  HENT: Negative for congestion, ear discharge, ear pain, rhinorrhea, sinus pressure, sneezing and sore throat.   Eyes: Negative for blurred vision, photophobia, pain, discharge, redness and itching.  Respiratory: Negative for cough, shortness  of breath, wheezing and stridor.   Cardiovascular: Negative for palpitations, orthopnea and PND.  Gastrointestinal: Negative for  abdominal pain, blood in stool, constipation, diarrhea, hematemesis, hematochezia, melena, nausea and vomiting.  Endocrine: Negative for cold intolerance, heat intolerance, polydipsia, polyphagia and polyuria.  Genitourinary: Negative for dysuria, flank pain, frequency, hematuria, menorrhagia, menstrual problem, pelvic pain, urgency, vaginal bleeding and vaginal discharge.  Musculoskeletal: Negative for arthralgias, back pain, myalgias and neck pain.  Skin: Negative for pallor and rash.  Allergic/Immunologic: Negative for environmental allergies and food allergies.  Neurological: Negative for dizziness, focal weakness, weakness, light-headedness, numbness and headaches.  Hematological: Negative for adenopathy. Does not bruise/bleed easily.  Psychiatric/Behavioral: Negative for dysphoric mood. The patient is not nervous/anxious.     Patient Active Problem List   Diagnosis Date Noted  . Centrilobular emphysema ([place]) 08/21/2018  . Dyshidrotic eczema 07/11/2017  . Hyperlipidemia, unspecified 05/03/2016  . Hypertension 05/03/2016    Allergies  Allergen Reactions  . Latex Itching    Past Surgical History:  Procedure Laterality Date  . COLONOSCOPY WITH PROPOFOL N/A 11/22/2014   Procedure: COLONOSCOPY WITH PROPOFOL;  Surgeon: Lucilla Lame, MD;  Location: [place];  Service: Endoscopy;  Laterality: N/A;  LATEX ALLERGY  . ECTOPIC PREGNANCY SURGERY    . ESOPHAGOGASTRODUODENOSCOPY (EGD) WITH PROPOFOL N/A 01/26/2017   Procedure: ESOPHAGOGASTRODUODENOSCOPY (EGD) WITH PROPOFOL;  Surgeon: Lin Landsman, MD;  Location: [place];  Service: Endoscopy;  Laterality: N/A;    Social History   Tobacco Use  . Smoking status: Former Smoker    Packs/day: 0.50    Years: 18.00    Pack years: 9.00    Types: Cigarettes  . Smokeless tobacco: Never Used  Vaping Use  . Vaping Use: Never used  Substance Use Topics  . Alcohol use: No    Alcohol/week: 0.0 standard drinks  . Drug  use: No     Medication list has been reviewed and updated.  Current Meds  Medication Sig  . aspirin 81 MG tablet Take 1 tablet (81 mg total) by mouth daily. AM  . Calcium Carbonate-Vit D-Min (CALCIUM 1200 PO) Take 1 tablet by mouth daily.  . fluticasone (FLONASE) 50 MCG/ACT nasal spray Place 2 sprays into the nose daily. Dr A  . folic acid ([facility] FOLIC ACID) 828 MCG tablet TAKE 1 TABLET DAILY. PT NEEDS TO TAKE 1 WHOLE PILL  . Glycopyrrolate-Formoterol 9-4.8 MCG/ACT AERO Inhale 2 puffs into the lungs 2 (two) times a day. Dr A  . hydrochlorothiazide (HYDRODIURIL) 25 MG tablet Take 1 tablet (25 mg total) by mouth daily.  Marland Kitchen losartan (COZAAR) 100 MG tablet Take 1 tablet (100 mg total) by mouth daily.  . metoprolol succinate (TOPROL-XL) 50 MG 24 hr tablet Take 1 tablet (50 mg total) by mouth daily. Take with or immediately following a meal.  . Multiple Vitamins-Minerals (MULTIVITAMIN WITH MINERALS) tablet Take 1 tablet by mouth daily.  . pravastatin (PRAVACHOL) 40 MG tablet TAKE 1 TABET BY MOUTH EVERY DAY    PHQ 2/9 Scores 08/20/2019 02/19/2019 09/06/2018 08/21/2018  PHQ - 2 Score 0 0 0 0  PHQ- 9 Score 0 0 - 0    GAD 7 : Generalized Anxiety Score 08/20/2019 02/19/2019  Nervous, Anxious, on Edge 0 0  Control/stop worrying 0 0  Worry too much - different things 0 0  Trouble relaxing 0 0  Restless 0 0  Easily annoyed or irritable 0 0  Afraid - awful might happen 0 0  Total GAD 7 Score 0  0    BP Readings from Last 3 Encounters:  08/20/19 110/62  02/19/19 120/64  09/06/18 109/76    Physical Exam Vitals and nursing note reviewed.  Constitutional:      Appearance: She is well-developed.  HENT:     Head: Normocephalic.     Right Ear: Tympanic membrane, ear canal and external ear normal.     Left Ear: Tympanic membrane, ear canal and external ear normal.     Nose: Nose normal.     Mouth/Throat:     Mouth: Mucous membranes are moist.     Pharynx: Oropharynx is clear.  Eyes:      General: Lids are everted, no foreign bodies appreciated. No scleral icterus.       Left eye: No foreign body or hordeolum.     Conjunctiva/sclera: Conjunctivae normal.     Right eye: Right conjunctiva is not injected.     Left eye: Left conjunctiva is not injected.     Pupils: Pupils are equal, round, and reactive to light.  Neck:     Thyroid: No thyromegaly.     Vascular: No JVD.     Trachea: No tracheal deviation.  Cardiovascular:     Rate and Rhythm: Normal rate and regular rhythm.     Heart sounds: Normal heart sounds. No murmur heard.  No friction rub. No gallop.   Pulmonary:     Effort: Pulmonary effort is normal. No respiratory distress.     Breath sounds: Normal breath sounds. No wheezing, rhonchi or rales.  Abdominal:     General: Bowel sounds are normal.     Palpations: Abdomen is soft. There is no mass.     Tenderness: There is no abdominal tenderness. There is no guarding or rebound.  Musculoskeletal:        General: No tenderness. Normal range of motion.     Cervical back: Normal range of motion and neck supple.  Lymphadenopathy:     Cervical: No cervical adenopathy.  Skin:    General: Skin is warm.     Findings: No rash.  Neurological:     Mental Status: She is alert and oriented to person, place, and time.     Cranial Nerves: No cranial nerve deficit.     Deep Tendon Reflexes: Reflexes normal.  Psychiatric:        Mood and Affect: Mood is not anxious or depressed.     Wt Readings from Last 3 Encounters:  08/20/19 164 lb (74.4 kg)  02/19/19 161 lb (73 kg)  09/06/18 161 lb (73 kg)    BP 110/62   Pulse 76   Ht 5\' 7"  (1.702 m)   Wt 164 lb (74.4 kg)   BMI 25.69 kg/m   Assessment and Plan: 1. Anemia due to folic acid deficiency, unspecified deficiency type Chronic.  Controlled.  Stable.  Continue folic acid 1 a day. - folic acid ([facility] FOLIC ACID) 875 MCG tablet; TAKE 1 TABLET DAILY. PT NEEDS TO TAKE 1 WHOLE PILL  Dispense: 90 tablet; Refill: 1  2.  Essential hypertension Chronic.  Controlled.  Stable.  Continue hydrochlorothiazide 25 mg, losartan 100 mg, and metoprolol XL 50 mg once a day.  We will recheck a renal function panel in 6 weeks. - hydrochlorothiazide (HYDRODIURIL) 25 MG tablet; Take 1 tablet (25 mg total) by mouth daily.  Dispense: 90 tablet; Refill: 1 - losartan (COZAAR) 100 MG tablet; Take 1 tablet (100 mg total) by mouth daily.  Dispense: 90 tablet; Refill: 1 -  metoprolol succinate (TOPROL-XL) 50 MG 24 hr tablet; Take 1 tablet (50 mg total) by mouth daily. Take with or immediately following a meal.  Dispense: 90 tablet; Refill: 1  3. Mixed hyperlipidemia Patient with myalgias might be secondary to the pravastatin.  We will discontinue the pravastatin and chest rely on the dietary approach meantime we will get a total washout of any of the statin to see if her upper arm muscle discomfort and her lower leg cramps resolved with cessation.  We will recheck patient in 6 weeks with lipid panel.

## 2019-09-12 ENCOUNTER — Other Ambulatory Visit: Payer: Self-pay

## 2019-09-12 ENCOUNTER — Ambulatory Visit (INDEPENDENT_AMBULATORY_CARE_PROVIDER_SITE_OTHER): Payer: Medicare HMO

## 2019-09-12 VITALS — BP 112/70 | HR 70 | Resp 16 | Ht 67.0 in | Wt 166.6 lb

## 2019-09-12 DIAGNOSIS — Z1231 Encounter for screening mammogram for malignant neoplasm of breast: Secondary | ICD-10-CM

## 2019-09-12 DIAGNOSIS — Z0000 Encounter for general adult medical examination without abnormal findings: Secondary | ICD-10-CM

## 2019-09-12 DIAGNOSIS — Z1211 Encounter for screening for malignant neoplasm of colon: Secondary | ICD-10-CM

## 2019-09-12 NOTE — Patient Instructions (Signed)
Taylor Griffin , Thank you for taking time to come for your Medicare Wellness Visit. I appreciate your ongoing commitment to your health goals. Please review the following plan we discussed and let me know if I can assist you in the future.   Screening recommendations/referrals: Colonoscopy: done 11/22/14. Referral sent today to [facility] for repeat screening colonoscopy.  Mammogram: done 10/10/18. Please call [phone] to schedule your mammogram.  Bone Density: done 10/10/18. Due 10/10/2020 Recommended yearly ophthalmology/optometry visit for glaucoma screening and checkup Recommended yearly dental visit for hygiene and checkup  Vaccinations: Influenza vaccine: done 12/25/18 Pneumococcal vaccine: done 07/11/17 Tdap vaccine: done 05/03/16 Shingles vaccine: Shingrix discussed. Please contact your pharmacy for coverage information.  Covid-19:done 04/03/19 & 05/01/19  Conditions/risks identified: Recommend drinking 6-8 glasses of water per day  Next appointment: Follow up in one year for your annual wellness visit    Preventive Care 65 Years and Older, Female Preventive care refers to lifestyle choices and visits with your health care provider that can promote health and wellness. What does preventive care include?  A yearly physical exam. This is also called an annual well check.  Dental exams once or twice a year.  Routine eye exams. Ask your health care provider how often you should have your eyes checked.  Personal lifestyle choices, including:  Daily care of your teeth and gums.  Regular physical activity.  Eating a healthy diet.  Avoiding tobacco and drug use.  Limiting alcohol use.  Practicing safe sex.  Taking low-dose aspirin every day.  Taking vitamin and mineral supplements as recommended by your health care provider. What happens during an annual well check? The services and screenings done by your health care provider during your annual well check will  depend on your age, overall health, lifestyle risk factors, and family history of disease. Counseling  Your health care provider may ask you questions about your:  Alcohol use.  Tobacco use.  Drug use.  Emotional well-being.  Home and relationship well-being.  Sexual activity.  Eating habits.  History of falls.  Memory and ability to understand (cognition).  Work and work Statistician.  Reproductive health. Screening  You may have the following tests or measurements:  Height, weight, and BMI.  Blood pressure.  Lipid and cholesterol levels. These may be checked every 5 years, or more frequently if you are over 13 years old.  Skin check.  Lung cancer screening. You may have this screening every year starting at age 22 if you have a 30-pack-year history of smoking and currently smoke or have quit within the past 15 years.  Fecal occult blood test (FOBT) of the stool. You may have this test every year starting at age 59.  Flexible sigmoidoscopy or colonoscopy. You may have a sigmoidoscopy every 5 years or a colonoscopy every 10 years starting at age 64.  Hepatitis C blood test.  Hepatitis B blood test.  Sexually transmitted disease (STD) testing.  Diabetes screening. This is done by checking your blood sugar (glucose) after you have not eaten for a while (fasting). You may have this done every 1-3 years.  Bone density scan. This is done to screen for osteoporosis. You may have this done starting at age 18.  Mammogram. This may be done every 1-2 years. Talk to your health care provider about how often you should have regular mammograms. Talk with your health care provider about your test results, treatment options, and if necessary, the need for more tests. Vaccines  Your health  care provider may recommend certain vaccines, such as:  Influenza vaccine. This is recommended every year.  Tetanus, diphtheria, and acellular pertussis (Tdap, Td) vaccine. You may need a  Td booster every 10 years.  Zoster vaccine. You may need this after age 20.  Pneumococcal 13-valent conjugate (PCV13) vaccine. One dose is recommended after age 31.  Pneumococcal polysaccharide (PPSV23) vaccine. One dose is recommended after age 33. Talk to your health care provider about which screenings and vaccines you need and how often you need them. This information is not intended to replace advice given to you by your health care provider. Make sure you discuss any questions you have with your health care provider. Document Released: 03/21/2015 Document Revised: 11/12/2015 Document Reviewed: 12/24/2014 Elsevier Interactive Patient Education  2017 [employer] Prevention in the Home Falls can cause injuries. They can happen to people of all ages. There are many things you can do to make your home safe and to help prevent falls. What can I do on the outside of my home?  Regularly fix the edges of walkways and driveways and fix any cracks.  Remove anything that might make you trip as you walk through a door, such as a raised step or threshold.  Trim any bushes or trees on the path to your home.  Use bright outdoor lighting.  Clear any walking paths of anything that might make someone trip, such as rocks or tools.  Regularly check to see if handrails are loose or broken. Make sure that both sides of any steps have handrails.  Any raised decks and porches should have guardrails on the edges.  Have any leaves, snow, or ice cleared regularly.  Use sand or salt on walking paths during winter.  Clean up any spills in your garage right away. This includes oil or grease spills. What can I do in the bathroom?  Use night lights.  Install grab bars by the toilet and in the tub and shower. Do not use towel bars as grab bars.  Use non-skid mats or decals in the tub or shower.  If you need to sit down in the shower, use a plastic, non-slip stool.  Keep the floor dry. Clean  up any water that spills on the floor as soon as it happens.  Remove soap buildup in the tub or shower regularly.  Attach bath mats securely with double-sided non-slip rug tape.  Do not have throw rugs and other things on the floor that can make you trip. What can I do in the bedroom?  Use night lights.  Make sure that you have a light by your bed that is easy to reach.  Do not use any sheets or blankets that are too big for your bed. They should not hang down onto the floor.  Have a firm chair that has side arms. You can use this for support while you get dressed.  Do not have throw rugs and other things on the floor that can make you trip. What can I do in the kitchen?  Clean up any spills right away.  Avoid walking on wet floors.  Keep items that you use a lot in easy-to-reach places.  If you need to reach something above you, use a strong step stool that has a grab bar.  Keep electrical cords out of the way.  Do not use floor polish or wax that makes floors slippery. If you must use wax, use non-skid floor wax.  Do not have  throw rugs and other things on the floor that can make you trip. What can I do with my stairs?  Do not leave any items on the stairs.  Make sure that there are handrails on both sides of the stairs and use them. Fix handrails that are broken or loose. Make sure that handrails are as long as the stairways.  Check any carpeting to make sure that it is firmly attached to the stairs. Fix any carpet that is loose or worn.  Avoid having throw rugs at the top or bottom of the stairs. If you do have throw rugs, attach them to the floor with carpet tape.  Make sure that you have a light switch at the top of the stairs and the bottom of the stairs. If you do not have them, ask someone to add them for you. What else can I do to help prevent falls?  Wear shoes that:  Do not have high heels.  Have rubber bottoms.  Are comfortable and fit you well.  Are  closed at the toe. Do not wear sandals.  If you use a stepladder:  Make sure that it is fully opened. Do not climb a closed stepladder.  Make sure that both sides of the stepladder are locked into place.  Ask someone to hold it for you, if possible.  Clearly mark and make sure that you can see:  Any grab bars or handrails.  First and last steps.  Where the edge of each step is.  Use tools that help you move around (mobility aids) if they are needed. These include:  Canes.  Walkers.  Scooters.  Crutches.  Turn on the lights when you go into a dark area. Replace any light bulbs as soon as they burn out.  Set up your furniture so you have a clear path. Avoid moving your furniture around.  If any of your floors are uneven, fix them.  If there are any pets around you, be aware of where they are.  Review your medicines with your doctor. Some medicines can make you feel dizzy. This can increase your chance of falling. Ask your doctor what other things that you can do to help prevent falls. This information is not intended to replace advice given to you by your health care provider. Make sure you discuss any questions you have with your health care provider. Document Released: 12/19/2008 Document Revised: 07/31/2015 Document Reviewed: 03/29/2014 Elsevier Interactive Patient Education  2017 [employer].

## 2019-09-12 NOTE — Progress Notes (Signed)
Subjective:   Taylor Griffin is a 69 y.o. female who presents for Medicare Annual (Subsequent) preventive examination.  Review of Systems    Cardiac Risk Factors include: advanced age (>84men, >91 women);dyslipidemia;hypertension     Objective:    Today's Vitals   09/12/19 1008  BP: 112/70  Pulse: 70  Resp: 16  SpO2: 97%  Weight: 166 lb 9.6 oz (75.6 kg)  Height: 5\' 7"  (1.702 m)   Body mass index is 26.09 kg/m.  Advanced Directives 09/12/2019 09/06/2018 01/26/2017 11/22/2014 10/29/2014  Does Patient Have a Medical Advance Directive? Yes Yes Yes Yes Yes  Type of Paramedic of [place];Living will [place];Living will [place] will  Does patient want to make changes to medical advance directive? - - No - Patient declined - -  Copy of [place] in Chart? Yes - validated most recent copy scanned in chart (See row information) No - copy requested No - copy requested - No - copy requested    Current Medications (verified) Outpatient Encounter Medications as of 09/12/2019  Medication Sig  . aspirin 81 MG tablet Take 1 tablet (81 mg total) by mouth daily. AM  . Calcium Carbonate-Vit D-Min (CALCIUM 1200 PO) Take 1 tablet by mouth daily.  . cholecalciferol (VITAMIN D3) 25 MCG (1000 UNIT) tablet Take 1,000 Units by mouth daily.  . folic acid (CVS FOLIC ACID) 093 MCG tablet TAKE 1 TABLET DAILY. PT NEEDS TO TAKE 1 WHOLE PILL  . Glycopyrrolate-Formoterol 9-4.8 MCG/ACT AERO Inhale 2 puffs into the lungs 2 (two) times a day. Dr A  . hydrochlorothiazide (HYDRODIURIL) 25 MG tablet Take 1 tablet (25 mg total) by mouth daily.  Marland Kitchen losartan (COZAAR) 100 MG tablet Take 1 tablet (100 mg total) by mouth daily.  . metoprolol succinate (TOPROL-XL) 50 MG 24 hr tablet Take 1 tablet (50 mg total) by mouth daily. Take with or immediately following a meal.  . Multiple Vitamins-Minerals (MULTIVITAMIN WITH MINERALS)  tablet Take 1 tablet by mouth daily.  . fluticasone (FLONASE) 50 MCG/ACT nasal spray Place 2 sprays into the nose daily. Dr A  . ipratropium-albuterol (DUONEB) 0.5-2.5 (3) MG/3ML SOLN Inhale 3 mLs into the lungs 3 (three) times daily as needed. Dr A   No facility-administered encounter medications on file as of 09/12/2019.    Allergies (verified) Latex   History: Past Medical History:  Diagnosis Date  . Anemia   . Anemia due to folic acid deficiency 2/35/5732  . Arthritis    WRIST AND FINGERS  . Benign neoplasm of ascending colon   . Benign neoplasm of descending colon   . Benign neoplasm of sigmoid colon   . COPD (chronic obstructive pulmonary disease) ([place])   . Esophageal dysphagia   . Gastroesophageal reflux disease 07/11/2017  . Heart burn   . Hyperlipidemia   . Hypertension    CONTROLLED WITH MEDS   Past Surgical History:  Procedure Laterality Date  . COLONOSCOPY WITH PROPOFOL N/A 11/22/2014   Procedure: COLONOSCOPY WITH PROPOFOL;  Surgeon: Lucilla Lame, MD;  Location: [place];  Service: Endoscopy;  Laterality: N/A;  LATEX ALLERGY  . ECTOPIC PREGNANCY SURGERY    . ESOPHAGOGASTRODUODENOSCOPY (EGD) WITH PROPOFOL N/A 01/26/2017   Procedure: ESOPHAGOGASTRODUODENOSCOPY (EGD) WITH PROPOFOL;  Surgeon: Lin Landsman, MD;  Location: [place];  Service: Endoscopy;  Laterality: N/A;   Family History  Problem Relation Age of Onset  . Diabetes Mother   .  Hypertension Mother   . Breast cancer Sister 24   Social History   Socioeconomic History  . Marital status: Married    Spouse name: Not on file  . Number of children: 2  . Years of education: Not on file  . Highest education level: Associate degree: academic program  Occupational History  . Not on file  Tobacco Use  . Smoking status: Former Smoker    Packs/day: 0.50    Years: 18.00    Pack years: 9.00    Types: Cigarettes  . Smokeless tobacco: Never Used  Vaping Use  . Vaping Use: Never used    Substance and Sexual Activity  . Alcohol use: No    Alcohol/week: 0.0 standard drinks  . Drug use: No  . Sexual activity: Never  Other Topics Concern  . Not on file  Social History Narrative  . Not on file   Social Determinants of Health   Financial Resource Strain: Low Risk   . Difficulty of Paying Living Expenses: Not very hard  Food Insecurity: No Food Insecurity  . Worried About Charity fundraiser in the Last Year: Never true  . Ran Out of Food in the Last Year: Never true  Transportation Needs: No Transportation Needs  . Lack of Transportation (Medical): No  . Lack of Transportation (Non-Medical): No  Physical Activity: Inactive  . Days of Exercise per Week: 0 days  . Minutes of Exercise per Session: 0 min  Stress: No Stress Concern Present  . Feeling of Stress : Not at all  Social Connections: Moderately Integrated  . Frequency of Communication with Friends and Family: More than three times a week  . Frequency of Social Gatherings with Friends and Family: More than three times a week  . Attends Religious Services: More than 4 times per year  . Active Member of Clubs or Organizations: No  . Attends Archivist Meetings: Never  . Marital Status: Married    Tobacco Counseling Counseling given: Not Answered   Clinical Intake:  Pre-visit preparation completed: Yes  Pain : No/denies pain     BMI - recorded: 26.09 Nutritional Status: BMI 25 -29 Overweight Nutritional Risks: None Diabetes: No  How often do you need to have someone help you when you read instructions, pamphlets, or other written materials from your doctor or pharmacy?: 1 - Never    Interpreter Needed?: No  Information entered by :: [name] LPN   Activities of Daily Living In your present state of health, do you have any difficulty performing the following activities: 09/12/2019  Hearing? N  Comment declines hearing aids  Vision? N  Difficulty concentrating or making  decisions? N  Walking or climbing stairs? N  Dressing or bathing? N  Doing errands, shopping? N  Preparing Food and eating ? N  Using the Toilet? N  In the past six months, have you accidently leaked urine? N  Do you have problems with loss of bowel control? N  Managing your Medications? N  Managing your Finances? N  Housekeeping or managing your Housekeeping? N  Some recent data might be hidden    Patient Care Team: Juline Patch, MD as PCP - General (Family Medicine) [place], Virl Diamond, [facility] as Pharmacist  Indicate any recent Medical Services you may have received from other than [facility] providers in the past year (date may be approximate).     Assessment:   This is a routine wellness examination for Taylor Griffin.  Hearing/Vision screen  Hearing  Screening   125Hz  250Hz  500Hz  1000Hz  2000Hz  3000Hz  4000Hz  6000Hz  8000Hz   Right ear:           Left ear:           Comments:  Pt denies hearing difficulty   Vision Screening Comments: Annual vision screening done by Dr. Camillo Flaming in [place]  Dietary issues and exercise activities discussed: Current Exercise Habits: The patient has a physically strenuous job, but has no regular exercise apart from work., Exercise limited by: None identified  Goals    . DIET - INCREASE WATER INTAKE     Recommend drinking 6-8 glasses of water per day      Depression Screen PHQ 2/9 Scores 09/12/2019 08/20/2019 02/19/2019 09/06/2018 08/21/2018 08/21/2018 02/20/2018  PHQ - 2 Score 0 0 0 0 0 0 0  PHQ- 9 Score - 0 0 - 0 0 0    Fall Risk Fall Risk  09/12/2019 08/20/2019 09/06/2018 08/21/2018 01/11/2018  Falls in the past year? 0 0 0 0 0  Number falls in past yr: 0 - 0 - -  Injury with Fall? 0 - 0 - -  Risk for fall due to : No Fall Risks - - - -  Follow up Falls prevention discussed Falls evaluation completed Falls prevention discussed Falls evaluation completed -    Any stairs in or around the home? Yes  If so, are there any without handrails? No  only 2  steps outside  Home free of loose throw rugs in walkways, pet beds, electrical cords, etc? Yes  Adequate lighting in your home to reduce risk of falls? Yes   ASSISTIVE DEVICES UTILIZED TO PREVENT FALLS:  Life alert? No  Use of a cane, walker or w/c? No  Grab bars in the bathroom? Yes  Shower chair or bench in shower? No  Elevated toilet seat or a handicapped toilet? No   TIMED UP AND GO:  Was the test performed? Yes .  Length of time to ambulate 10 feet: 5 sec.   Gait steady and fast without use of assistive device  Cognitive Function: pt declined 6CIT for 2021 AWV; pt has no memory issues.         Immunizations Immunization History  Administered Date(s) Administered  . Fluad Quad(high Dose 65+) 12/25/2018  . Influenza, High Dose Seasonal PF 11/01/2016, 01/11/2018  . Moderna SARS-COVID-2 Vaccination 04/03/2019, 05/01/2019  . Pneumococcal Conjugate-13 05/03/2016  . Pneumococcal Polysaccharide-23 07/11/2017  . Tdap 05/03/2016    TDAP status: Up to date   Flu Vaccine status: Up to date   Pneumococcal vaccine status: Up to date   Covid-19 vaccine status: Completed vaccines  Qualifies for Shingles Vaccine? Yes   Zostavax completed No   Shingrix Completed?: No.    Education has been provided regarding the importance of this vaccine. Patient has been advised to call insurance company to determine out of pocket expense if they have not yet received this vaccine. Advised may also receive vaccine at local pharmacy or Health Dept. Verbalized acceptance and understanding.  Screening Tests Health Maintenance  Topic Date Due  . INFLUENZA VACCINE  10/07/2019  . COLONOSCOPY  11/22/2019  . MAMMOGRAM  10/09/2020  . TETANUS/TDAP  05/03/2026  . DEXA SCAN  Completed  . COVID-19 Vaccine  Completed  . Hepatitis C Screening  Completed  . PNA vac Low Risk Adult  Completed    Health Maintenance  There are no preventive care reminders to display for this patient.  Colorectal  cancer screening:  Completed 11/22/14. Repeat every 5 years. Referral sent to GI today for repeat screening colonoscopy. Pt aware they will contact her for appt.   Mammogram status: Completed 10/10/18. Repeat every year. Ordered today. Pt provided with contact information to call and schedule appt.   Bone Density status: Completed 10/10/18. Results reflect: Bone density results: OSTEOPENIA. Repeat every 2 years.  Lung Cancer Screening: (Low Dose CT Chest recommended if Age 21-80 years, 30 pack-year currently smoking OR have quit w/in 15years.) does not qualify.   Additional Screening:  Hepatitis C Screening: does qualify; Completed 05/03/16  Vision Screening: Recommended annual ophthalmology exams for early detection of glaucoma and other disorders of the eye. Is the patient up to date with their annual eye exam?  Yes  Who is the provider or what is the name of the office in which the patient attends annual eye exams? Eye provider in [place]: Recommended annual dental exams for proper oral hygiene  Community Resource Referral / Chronic Care Management: CRR required this visit?  No   CCM required this visit?  No      Plan:     I have personally reviewed and noted the following in the patient's chart:   . Medical and social history . Use of alcohol, tobacco or illicit drugs  . Current medications and supplements . Functional ability and status . Nutritional status . Physical activity . Advanced directives . List of other physicians . Hospitalizations, surgeries, and ER visits in previous 12 months . Vitals . Screenings to include cognitive, depression, and falls . Referrals and appointments  In addition, I have reviewed and discussed with patient certain preventive protocols, quality metrics, and best practice recommendations. A written personalized care plan for preventive services as well as general preventive health recommendations were provided to patient.      [name], LPN   06/14/163   Nurse Notes: pt doing well and appreciative of visit today.

## 2019-09-19 ENCOUNTER — Other Ambulatory Visit: Payer: Self-pay

## 2019-09-19 ENCOUNTER — Ambulatory Visit (INDEPENDENT_AMBULATORY_CARE_PROVIDER_SITE_OTHER): Payer: Medicare HMO | Admitting: Family Medicine

## 2019-09-19 ENCOUNTER — Encounter: Payer: Self-pay | Admitting: Family Medicine

## 2019-09-19 VITALS — BP 120/60 | HR 80 | Ht 67.0 in | Wt 165.0 lb

## 2019-09-19 DIAGNOSIS — E785 Hyperlipidemia, unspecified: Secondary | ICD-10-CM | POA: Diagnosis not present

## 2019-09-19 DIAGNOSIS — Z789 Other specified health status: Secondary | ICD-10-CM

## 2019-09-19 NOTE — Progress Notes (Signed)
Date:  09/19/2019   Name:  Taylor Griffin   DOB:  11/14/1950   MRN:  [id number]   Chief Complaint: Hyperlipidemia (stopped chol med- leg cramps improved- recheck lipid)  Hyperlipidemia This is a chronic problem. The current episode started more than 1 year ago. The problem is controlled. Recent lipid tests were reviewed and are normal. She has no history of chronic renal disease, diabetes, hypothyroidism, liver disease, obesity or nephrotic syndrome. Pertinent negatives include no chest pain, focal sensory loss, focal weakness, leg pain, myalgias or shortness of breath. (Resolution of cramps) Current antihyperlipidemic treatment includes diet change. The current treatment provides moderate improvement of lipids. There are no compliance problems.     Lab Results  Component Value Date   CREATININE 1.20 (H) 02/19/2019   BUN 23 02/19/2019   NA 143 02/19/2019   K 4.4 02/19/2019   CL 102 02/19/2019   CO2 23 02/19/2019   Lab Results  Component Value Date   CHOL 194 08/21/2018   HDL 42 08/21/2018   LDLCALC 127 (H) 08/21/2018   TRIG 126 08/21/2018   CHOLHDL 5.2 (H) 07/11/2017   No results found for: TSH No results found for: HGBA1C Lab Results  Component Value Date   WBC 5.3 02/19/2019   HGB 14.6 02/19/2019   HCT 42.2 02/19/2019   MCV 91 02/19/2019   PLT 308 02/19/2019   Lab Results  Component Value Date   ALT 25 08/21/2018   AST 39 08/21/2018   ALKPHOS 71 08/21/2018   BILITOT 0.5 08/21/2018     Review of Systems  Constitutional: Negative.  Negative for chills, fatigue, fever and unexpected weight change.  HENT: Negative for congestion, ear discharge, ear pain, rhinorrhea, sinus pressure, sneezing and sore throat.   Eyes: Negative for photophobia, pain, discharge, redness and itching.  Respiratory: Negative for cough, shortness of breath, wheezing and stridor.   Cardiovascular: Negative for chest pain.  Gastrointestinal: Negative for abdominal pain, blood in  stool, constipation, diarrhea, nausea and vomiting.  Endocrine: Negative for cold intolerance, heat intolerance, polydipsia, polyphagia and polyuria.  Genitourinary: Negative for dysuria, flank pain, frequency, hematuria, menstrual problem, pelvic pain, urgency, vaginal bleeding and vaginal discharge.  Musculoskeletal: Negative for arthralgias, back pain and myalgias.  Skin: Negative for rash.  Allergic/Immunologic: Negative for environmental allergies and food allergies.  Neurological: Negative for dizziness, focal weakness, weakness, light-headedness, numbness and headaches.  Hematological: Negative for adenopathy. Does not bruise/bleed easily.  Psychiatric/Behavioral: Negative for dysphoric mood. The patient is not nervous/anxious.     Patient Active Problem List   Diagnosis Date Noted  . Centrilobular emphysema ([place]) 08/21/2018  . Dyshidrotic eczema 07/11/2017  . Hyperlipidemia, unspecified 05/03/2016  . Hypertension 05/03/2016    Allergies  Allergen Reactions  . Latex Itching    Past Surgical History:  Procedure Laterality Date  . COLONOSCOPY WITH PROPOFOL N/A 11/22/2014   Procedure: COLONOSCOPY WITH PROPOFOL;  Surgeon: Lucilla Lame, MD;  Location: [place];  Service: Endoscopy;  Laterality: N/A;  LATEX ALLERGY  . ECTOPIC PREGNANCY SURGERY    . ESOPHAGOGASTRODUODENOSCOPY (EGD) WITH PROPOFOL N/A 01/26/2017   Procedure: ESOPHAGOGASTRODUODENOSCOPY (EGD) WITH PROPOFOL;  Surgeon: Lin Landsman, MD;  Location: [place];  Service: Endoscopy;  Laterality: N/A;    Social History   Tobacco Use  . Smoking status: Former Smoker    Packs/day: 0.50    Years: 18.00    Pack years: 9.00    Types: Cigarettes  . Smokeless tobacco: Never Used  Vaping Use  . Vaping Use: Never used  Substance Use Topics  . Alcohol use: No    Alcohol/week: 0.0 standard drinks  . Drug use: No     Medication list has been reviewed and updated.  Current Meds  Medication Sig    . aspirin 81 MG tablet Take 1 tablet (81 mg total) by mouth daily. AM  . Calcium Carbonate-Vit D-Min (CALCIUM 1200 PO) Take 1 tablet by mouth daily.  . cholecalciferol (VITAMIN D3) 25 MCG (1000 UNIT) tablet Take 1,000 Units by mouth daily.  . fluticasone (FLONASE) 50 MCG/ACT nasal spray Place 2 sprays into the nose daily. Dr A  . folic acid (CVS FOLIC ACID) 371 MCG tablet TAKE 1 TABLET DAILY. PT NEEDS TO TAKE 1 WHOLE PILL  . Glycopyrrolate-Formoterol 9-4.8 MCG/ACT AERO Inhale 2 puffs into the lungs 2 (two) times a day. Dr A  . hydrochlorothiazide (HYDRODIURIL) 25 MG tablet Take 1 tablet (25 mg total) by mouth daily.  Marland Kitchen ipratropium-albuterol (DUONEB) 0.5-2.5 (3) MG/3ML SOLN Inhale 3 mLs into the lungs 3 (three) times daily as needed. Dr A  . losartan (COZAAR) 100 MG tablet Take 1 tablet (100 mg total) by mouth daily.  . metoprolol succinate (TOPROL-XL) 50 MG 24 hr tablet Take 1 tablet (50 mg total) by mouth daily. Take with or immediately following a meal.  . Multiple Vitamins-Minerals (MULTIVITAMIN WITH MINERALS) tablet Take 1 tablet by mouth daily.    PHQ 2/9 Scores 09/19/2019 09/12/2019 08/20/2019 02/19/2019  PHQ - 2 Score 0 0 0 0  PHQ- 9 Score 0 - 0 0    GAD 7 : Generalized Anxiety Score 09/19/2019 08/20/2019 02/19/2019  Nervous, Anxious, on Edge 0 0 0  Control/stop worrying 0 0 0  Worry too much - different things 0 0 0  Trouble relaxing 0 0 0  Restless 0 0 0  Easily annoyed or irritable 0 0 0  Afraid - awful might happen 0 0 0  Total GAD 7 Score 0 0 0    BP Readings from Last 3 Encounters:  09/19/19 120/60  09/12/19 112/70  08/20/19 110/62    Physical Exam Vitals and nursing note reviewed.  Constitutional:      Appearance: She is well-developed.  HENT:     Head: Normocephalic.     Right Ear: External ear normal.     Left Ear: External ear normal.  Eyes:     General: Lids are everted, no foreign bodies appreciated. No scleral icterus.       Left eye: No foreign body or  hordeolum.     Conjunctiva/sclera: Conjunctivae normal.     Right eye: Right conjunctiva is not injected.     Left eye: Left conjunctiva is not injected.     Pupils: Pupils are equal, round, and reactive to light.  Neck:     Thyroid: No thyromegaly.     Vascular: No JVD.     Trachea: No tracheal deviation.  Cardiovascular:     Rate and Rhythm: Normal rate and regular rhythm.     Heart sounds: Normal heart sounds. No murmur heard.  No friction rub. No gallop.   Pulmonary:     Effort: Pulmonary effort is normal. No respiratory distress.     Breath sounds: Normal breath sounds. No wheezing or rales.  Abdominal:     General: Bowel sounds are normal.     Palpations: Abdomen is soft. There is no mass.     Tenderness: There is no abdominal tenderness. There  is no guarding or rebound.  Musculoskeletal:        General: No tenderness. Normal range of motion.     Cervical back: Normal range of motion and neck supple.  Lymphadenopathy:     Cervical: No cervical adenopathy.  Skin:    General: Skin is warm.     Findings: No rash.  Neurological:     Mental Status: She is alert and oriented to person, place, and time.     Cranial Nerves: No cranial nerve deficit.     Deep Tendon Reflexes: Reflexes normal.  Psychiatric:        Mood and Affect: Mood is not anxious or depressed.     Wt Readings from Last 3 Encounters:  09/19/19 165 lb (74.8 kg)  09/12/19 166 lb 9.6 oz (75.6 kg)  08/20/19 164 lb (74.4 kg)    BP 120/60   Pulse 80   Ht 5\' 7"  (1.702 m)   Wt 165 lb (74.8 kg)   BMI 25.84 kg/m   Assessment and Plan:  1. Statin intolerance New onset.  Resolving.  Stable.  Since cessation of statin patient has not not had any myalgias of her arms or lower extremities.  Is been no nocturnal cramps.  We will continue off Crestor and only controlled with dietary means.  2. Hyperlipidemia, unspecified hyperlipidemia type Patient has been given a sheet to decrease her triglycerides and  cholesterol intake.  We will check a lipid panel to determine what state of her lipid panel is off to statins. - Lipid Panel With LDL/HDL Ratio

## 2019-09-19 NOTE — Patient Instructions (Signed)

## 2019-09-20 ENCOUNTER — Other Ambulatory Visit: Payer: Self-pay

## 2019-09-20 DIAGNOSIS — E785 Hyperlipidemia, unspecified: Secondary | ICD-10-CM

## 2019-09-20 LAB — LIPID PANEL WITH LDL/HDL RATIO
Cholesterol, Total: 266 mg/dL — ABNORMAL HIGH (ref 100–199)
HDL: 38 mg/dL — ABNORMAL LOW (ref >39)
LDL Chol Calc (NIH): 189 mg/dL — ABNORMAL HIGH (ref 0–99)
LDL/HDL Ratio: 5 ratio — ABNORMAL HIGH (ref 0.0–3.2)
Triglycerides: 205 mg/dL — ABNORMAL HIGH (ref 0–149)
VLDL Cholesterol Cal: 39 mg/dL (ref 5–40)

## 2019-09-20 MED ORDER — EZETIMIBE 10 MG PO TABS: 10.0000 mg | ORAL_TABLET | Freq: Every day | ORAL | 1 refills | Status: DC

## 2019-09-26 ENCOUNTER — Telehealth: Payer: Self-pay

## 2019-09-26 ENCOUNTER — Other Ambulatory Visit: Payer: Self-pay

## 2019-09-26 ENCOUNTER — Telehealth (INDEPENDENT_AMBULATORY_CARE_PROVIDER_SITE_OTHER): Payer: Self-pay | Admitting: Gastroenterology

## 2019-09-26 DIAGNOSIS — Z1211 Encounter for screening for malignant neoplasm of colon: Secondary | ICD-10-CM

## 2019-09-26 DIAGNOSIS — Z86010 Personal history of colonic polyps: Secondary | ICD-10-CM

## 2019-09-26 NOTE — Telephone Encounter (Signed)
Called pt to check on mammo- she goes in Aug. Also, she is coming to get lipid rechecked before 30 days is over because Zetia was 60.00 for a 30 day supply

## 2019-09-26 NOTE — Progress Notes (Signed)
Gastroenterology Pre-Procedure Review  Request Date: Friday 12/14/19 Requesting Physician: Dr. Allen Norris  PATIENT REVIEW QUESTIONS: The patient responded to the following health history questions as indicated:    1. Are you having any GI issues? yes (stomach pain sometimes, office visit declined) 2. Do you have a personal history of Polyps? yes (11/22/14 colon polyps noted on colonoscopy performed by Dr. Allen Norris) 3. Do you have a family history of Colon Cancer or Polyps? no 4. Diabetes Mellitus? no 5. Joint replacements in the past 12 months?no 6. Major health problems in the past 3 months?no 7. Any artificial heart valves, MVP, or defibrillator?no    MEDICATIONS & ALLERGIES:    Patient reports the following regarding taking any anticoagulation/antiplatelet therapy:   Plavix, Coumadin, Eliquis, Xarelto, Lovenox, Pradaxa, Brilinta, or Effient? no Aspirin? Yes 81 mg daily  Patient confirms/reports the following medications:  Current Outpatient Medications  Medication Sig Dispense Refill  . aspirin 81 MG tablet Take 1 tablet (81 mg total) by mouth daily. AM 30 tablet 11  . Calcium Carbonate-Vit D-Min (CALCIUM 1200 PO) Take 1 tablet by mouth daily.    . cholecalciferol (VITAMIN D3) 25 MCG (1000 UNIT) tablet Take 1,000 Units by mouth daily.    Marland Kitchen ezetimibe (ZETIA) 10 MG tablet Take 1 tablet (10 mg total) by mouth daily. 30 tablet 1  . folic acid (CVS FOLIC ACID) 814 MCG tablet TAKE 1 TABLET DAILY. PT NEEDS TO TAKE 1 WHOLE PILL 90 tablet 1  . Glycopyrrolate-Formoterol 9-4.8 MCG/ACT AERO Inhale 2 puffs into the lungs 2 (two) times a day. Dr A    . hydrochlorothiazide (HYDRODIURIL) 25 MG tablet Take 1 tablet (25 mg total) by mouth daily. 90 tablet 1  . losartan (COZAAR) 100 MG tablet Take 1 tablet (100 mg total) by mouth daily. 90 tablet 1  . metoprolol succinate (TOPROL-XL) 50 MG 24 hr tablet Take 1 tablet (50 mg total) by mouth daily. Take with or immediately following a meal. 90 tablet 1  .  Multiple Vitamins-Minerals (MULTIVITAMIN WITH MINERALS) tablet Take 1 tablet by mouth daily.    . fluticasone (FLONASE) 50 MCG/ACT nasal spray Place 2 sprays into the nose daily. Dr A    . ipratropium-albuterol (DUONEB) 0.5-2.5 (3) MG/3ML SOLN Inhale 3 mLs into the lungs 3 (three) times daily as needed. Dr A     No current facility-administered medications for this visit.    Patient confirms/reports the following allergies:  Allergies  Allergen Reactions  . Latex Itching    No orders of the defined types were placed in this encounter.   AUTHORIZATION INFORMATION Primary Insurance: 1D#: Group #:  Secondary Insurance: 1D#: Group #:  SCHEDULE INFORMATION: Date:Friday 12/14/19  Time: Location:[facility]

## 2019-10-01 ENCOUNTER — Ambulatory Visit: Payer: Medicare HMO | Admitting: Family Medicine

## 2019-10-04 ENCOUNTER — Other Ambulatory Visit: Payer: Self-pay | Admitting: Family Medicine

## 2019-10-04 DIAGNOSIS — E782 Mixed hyperlipidemia: Secondary | ICD-10-CM

## 2019-10-13 ENCOUNTER — Other Ambulatory Visit: Payer: Self-pay | Admitting: Family Medicine

## 2019-10-13 DIAGNOSIS — E785 Hyperlipidemia, unspecified: Secondary | ICD-10-CM

## 2019-10-13 NOTE — Telephone Encounter (Signed)
Requested Prescriptions  Pending Prescriptions Disp Refills  . ezetimibe (ZETIA) 10 MG tablet [Pharmacy Med Name: EZETIMIBE 10 MG TABLET] 90 tablet 0    Sig: TAKE 1 TABLET BY MOUTH EVERY DAY     Cardiovascular:  Antilipid - Sterol Transport Inhibitors Failed - 10/13/2019 10:35 AM      Failed - Total Cholesterol in normal range and within 360 days    Cholesterol, Total  Date Value Ref Range Status  09/19/2019 266 (H) 100 - 199 mg/dL Final         Failed - LDL in normal range and within 360 days    LDL Chol Calc (NIH)  Date Value Ref Range Status  09/19/2019 189 (H) 0 - 99 mg/dL Final         Failed - HDL in normal range and within 360 days    HDL  Date Value Ref Range Status  09/19/2019 38 (L) >39 mg/dL Final         Failed - Triglycerides in normal range and within 360 days    Triglycerides  Date Value Ref Range Status  09/19/2019 205 (H) 0 - 149 mg/dL Final         Passed - Valid encounter within last 12 months    Recent Outpatient Visits          3 weeks ago Statin intolerance   [facility] Juline Patch, MD   1 month ago Anemia due to folic acid deficiency, unspecified deficiency type   [facility] Juline Patch, MD   7 months ago Anemia due to folic acid deficiency, unspecified deficiency type   [place], Deanna C, MD   1 year ago Essential hypertension   [place], Deanna C, MD   1 year ago Essential hypertension   [place], Deanna C, MD      Future Appointments            In 4 months Juline Patch, MD [facility], [facility]

## 2019-10-15 ENCOUNTER — Other Ambulatory Visit: Payer: Medicare HMO

## 2019-10-15 ENCOUNTER — Other Ambulatory Visit: Payer: Self-pay

## 2019-10-15 ENCOUNTER — Ambulatory Visit
Admission: RE | Admit: 2019-10-15 | Discharge: 2019-10-15 | Disposition: A | Discharge status: Home / Self Care | Payer: Medicare HMO | Source: Ambulatory Visit | Attending: Family Medicine | Admitting: Family Medicine

## 2019-10-15 ENCOUNTER — Telehealth: Payer: Self-pay | Admitting: Family Medicine

## 2019-10-15 DIAGNOSIS — Z1231 Encounter for screening mammogram for malignant neoplasm of breast: Secondary | ICD-10-CM | POA: Diagnosis not present

## 2019-10-15 DIAGNOSIS — E785 Hyperlipidemia, unspecified: Secondary | ICD-10-CM | POA: Diagnosis not present

## 2019-10-15 DIAGNOSIS — R69 Illness, unspecified: Secondary | ICD-10-CM

## 2019-10-15 NOTE — Telephone Encounter (Signed)
Called and left message for pt to come in for labs in 2 weeks or so

## 2019-10-15 NOTE — Telephone Encounter (Signed)
Copied from [place] [id number]. Topic: General - Inquiry >> Oct 15, 2019  7:36 AM Scherrie Gerlach wrote: Reason for CRM: pt had a lipid panel 7/14 and thinks she was to repeat in a couple of weeks. Pt is in the building now and wants to know if Dr Ronnald Ramp will put the order in so she can go to the lab now.

## 2019-10-16 DIAGNOSIS — Z833 Family history of diabetes mellitus: Secondary | ICD-10-CM | POA: Diagnosis not present

## 2019-10-16 DIAGNOSIS — I10 Essential (primary) hypertension: Secondary | ICD-10-CM | POA: Diagnosis not present

## 2019-10-16 DIAGNOSIS — D649 Anemia, unspecified: Secondary | ICD-10-CM | POA: Diagnosis not present

## 2019-10-16 DIAGNOSIS — J439 Emphysema, unspecified: Secondary | ICD-10-CM | POA: Diagnosis not present

## 2019-10-16 DIAGNOSIS — M1990 Unspecified osteoarthritis, unspecified site: Secondary | ICD-10-CM | POA: Diagnosis not present

## 2019-10-16 DIAGNOSIS — E785 Hyperlipidemia, unspecified: Secondary | ICD-10-CM | POA: Diagnosis not present

## 2019-10-16 DIAGNOSIS — Z8249 Family history of ischemic heart disease and other diseases of the circulatory system: Secondary | ICD-10-CM | POA: Diagnosis not present

## 2019-10-16 DIAGNOSIS — Z7982 Long term (current) use of aspirin: Secondary | ICD-10-CM | POA: Diagnosis not present

## 2019-10-16 DIAGNOSIS — Z79899 Other long term (current) drug therapy: Secondary | ICD-10-CM | POA: Diagnosis not present

## 2019-10-16 LAB — LIPID PANEL WITH LDL/HDL RATIO
Cholesterol, Total: 212 mg/dL — ABNORMAL HIGH (ref 100–199)
HDL: 41 mg/dL (ref >39)
LDL Chol Calc (NIH): 140 mg/dL — ABNORMAL HIGH (ref 0–99)
LDL/HDL Ratio: 3.4 ratio — ABNORMAL HIGH (ref 0.0–3.2)
Triglycerides: 173 mg/dL — ABNORMAL HIGH (ref 0–149)
VLDL Cholesterol Cal: 31 mg/dL (ref 5–40)

## 2019-10-16 LAB — HEPATIC FUNCTION PANEL
ALT: 20 IU/L (ref 0–32)
AST: 23 IU/L (ref 0–40)
Albumin: 4.4 g/dL (ref 3.8–4.8)
Alkaline Phosphatase: 70 IU/L (ref 48–121)
Bilirubin Total: 0.6 mg/dL (ref 0.0–1.2)
Bilirubin, Direct: 0.13 mg/dL (ref 0.00–0.40)
Total Protein: 6.7 g/dL (ref 6.0–8.5)

## 2019-10-30 DIAGNOSIS — J449 Chronic obstructive pulmonary disease, unspecified: Secondary | ICD-10-CM | POA: Diagnosis not present

## 2019-10-30 DIAGNOSIS — Z01818 Encounter for other preprocedural examination: Secondary | ICD-10-CM | POA: Diagnosis not present

## 2019-12-05 ENCOUNTER — Encounter: Payer: Self-pay | Admitting: Gastroenterology

## 2019-12-06 DIAGNOSIS — R69 Illness, unspecified: Secondary | ICD-10-CM | POA: Diagnosis not present

## 2019-12-06 NOTE — Discharge Instructions (Signed)
General Anesthesia, Adult, Care After This sheet gives you information about how to care for yourself after your procedure. Your health care provider may also give you more specific instructions. If you have problems or questions, contact your health care provider. What can I expect after the procedure? After the procedure, the following side effects are common:  Pain or discomfort at the IV site.  Nausea.  Vomiting.  Sore throat.  Trouble concentrating.  Feeling cold or chills.  Weak or tired.  Sleepiness and fatigue.  Soreness and body aches. These side effects can affect parts of the body that were not involved in surgery. Follow these instructions at home:  For at least 24 hours after the procedure:  Have a responsible adult stay with you. It is important to have someone help care for you until you are awake and alert.  Rest as needed.  Do not: ? Participate in activities in which you could fall or become injured. ? Drive. ? Use heavy machinery. ? Drink alcohol. ? Take sleeping pills or medicines that cause drowsiness. ? Make important decisions or sign legal documents. ? Take care of children on your own. Eating and drinking  Follow any instructions from your health care provider about eating or drinking restrictions.  When you feel hungry, start by eating small amounts of foods that are soft and easy to digest (bland), such as toast. Gradually return to your regular diet.  Drink enough fluid to keep your urine pale yellow.  If you vomit, rehydrate by drinking water, juice, or clear broth. General instructions  If you have sleep apnea, surgery and certain medicines can increase your risk for breathing problems. Follow instructions from your health care provider about wearing your sleep device: ? Anytime you are sleeping, including during daytime naps. ? While taking prescription pain medicines, sleeping medicines, or medicines that make you drowsy.  Return to  your normal activities as told by your health care provider. Ask your health care provider what activities are safe for you.  Take over-the-counter and prescription medicines only as told by your health care provider.  If you smoke, do not smoke without supervision.  Keep all follow-up visits as told by your health care provider. This is important. Contact a health care provider if:  You have nausea or vomiting that does not get better with medicine.  You cannot eat or drink without vomiting.  You have pain that does not get better with medicine.  You are unable to pass urine.  You develop a skin rash.  You have a fever.  You have redness around your IV site that gets worse. Get help right away if:  You have difficulty breathing.  You have chest pain.  You have blood in your urine or stool, or you vomit blood. Summary  After the procedure, it is common to have a sore throat or nausea. It is also common to feel tired.  Have a responsible adult stay with you for the first 24 hours after general anesthesia. It is important to have someone help care for you until you are awake and alert.  When you feel hungry, start by eating small amounts of foods that are soft and easy to digest (bland), such as toast. Gradually return to your regular diet.  Drink enough fluid to keep your urine pale yellow.  Return to your normal activities as told by your health care provider. Ask your health care provider what activities are safe for you. This information is not   intended to replace advice given to you by your health care provider. Make sure you discuss any questions you have with your health care provider. Document Revised: 02/25/2017 Document Reviewed: 10/08/2016 Elsevier Patient Education  2020 Elsevier Inc.  

## 2019-12-12 ENCOUNTER — Other Ambulatory Visit: Payer: Self-pay

## 2019-12-12 ENCOUNTER — Other Ambulatory Visit
Admission: RE | Admit: 2019-12-12 | Discharge: 2019-12-12 | Disposition: A | Discharge status: Home / Self Care | Payer: Medicare HMO | Source: Ambulatory Visit | Attending: Gastroenterology | Admitting: Gastroenterology

## 2019-12-12 DIAGNOSIS — Z01812 Encounter for preprocedural laboratory examination: Secondary | ICD-10-CM | POA: Insufficient documentation

## 2019-12-12 DIAGNOSIS — Z20822 Contact with and (suspected) exposure to covid-19: Secondary | ICD-10-CM | POA: Insufficient documentation

## 2019-12-12 LAB — SARS CORONAVIRUS 2 (TAT 6-24 HRS): SARS Coronavirus 2: NEGATIVE

## 2019-12-14 ENCOUNTER — Ambulatory Visit: Payer: Medicare HMO | Admitting: Anesthesiology

## 2019-12-14 ENCOUNTER — Ambulatory Visit
Admission: RE | Admit: 2019-12-14 | Discharge: 2019-12-14 | Disposition: A | Discharge status: Home / Self Care | Payer: Medicare HMO | Attending: Gastroenterology | Admitting: Gastroenterology

## 2019-12-14 ENCOUNTER — Encounter
Admission: RE | Disposition: A | Discharge status: Home / Self Care | Payer: Self-pay | Source: Home / Self Care | Attending: Gastroenterology

## 2019-12-14 ENCOUNTER — Encounter: Payer: Self-pay | Admitting: Gastroenterology

## 2019-12-14 ENCOUNTER — Other Ambulatory Visit: Payer: Self-pay

## 2019-12-14 DIAGNOSIS — E785 Hyperlipidemia, unspecified: Secondary | ICD-10-CM | POA: Insufficient documentation

## 2019-12-14 DIAGNOSIS — Z1211 Encounter for screening for malignant neoplasm of colon: Secondary | ICD-10-CM | POA: Insufficient documentation

## 2019-12-14 DIAGNOSIS — K640 First degree hemorrhoids: Secondary | ICD-10-CM | POA: Diagnosis not present

## 2019-12-14 DIAGNOSIS — Z7982 Long term (current) use of aspirin: Secondary | ICD-10-CM | POA: Diagnosis not present

## 2019-12-14 DIAGNOSIS — Z86010 Personal history of colonic polyps: Secondary | ICD-10-CM | POA: Diagnosis not present

## 2019-12-14 DIAGNOSIS — M19041 Primary osteoarthritis, right hand: Secondary | ICD-10-CM | POA: Diagnosis not present

## 2019-12-14 DIAGNOSIS — M19042 Primary osteoarthritis, left hand: Secondary | ICD-10-CM | POA: Insufficient documentation

## 2019-12-14 DIAGNOSIS — K5730 Diverticulosis of large intestine without perforation or abscess without bleeding: Secondary | ICD-10-CM | POA: Insufficient documentation

## 2019-12-14 DIAGNOSIS — I10 Essential (primary) hypertension: Secondary | ICD-10-CM | POA: Diagnosis not present

## 2019-12-14 DIAGNOSIS — Z79899 Other long term (current) drug therapy: Secondary | ICD-10-CM | POA: Diagnosis not present

## 2019-12-14 DIAGNOSIS — Z87891 Personal history of nicotine dependence: Secondary | ICD-10-CM | POA: Diagnosis not present

## 2019-12-14 DIAGNOSIS — J449 Chronic obstructive pulmonary disease, unspecified: Secondary | ICD-10-CM | POA: Diagnosis not present

## 2019-12-14 DIAGNOSIS — D529 Folate deficiency anemia, unspecified: Secondary | ICD-10-CM | POA: Insufficient documentation

## 2019-12-14 DIAGNOSIS — D122 Benign neoplasm of ascending colon: Secondary | ICD-10-CM | POA: Diagnosis not present

## 2019-12-14 DIAGNOSIS — K635 Polyp of colon: Secondary | ICD-10-CM | POA: Diagnosis not present

## 2019-12-14 DIAGNOSIS — D123 Benign neoplasm of transverse colon: Secondary | ICD-10-CM | POA: Insufficient documentation

## 2019-12-14 HISTORY — PX: POLYPECTOMY: SHX5525

## 2019-12-14 HISTORY — PX: COLONOSCOPY WITH PROPOFOL: SHX5780

## 2019-12-14 SURGERY — COLONOSCOPY WITH PROPOFOL
Anesthesia: General | Site: Rectum

## 2019-12-14 MED ORDER — LACTATED RINGERS IV SOLN: INTRAVENOUS | Status: DC

## 2019-12-14 MED ORDER — GLYCOPYRROLATE 0.2 MG/ML IJ SOLN
INTRAMUSCULAR | Status: DC | PRN
  Administered 2019-12-14: .2 mg via INTRAVENOUS

## 2019-12-14 MED ORDER — PROPOFOL 10 MG/ML IV BOLUS
INTRAVENOUS | Status: DC | PRN
  Administered 2019-12-14: 100 mg via INTRAVENOUS
  Administered 2019-12-14: 50 mg via INTRAVENOUS

## 2019-12-14 MED ORDER — LIDOCAINE HCL (CARDIAC) PF 100 MG/5ML IV SOSY
PREFILLED_SYRINGE | INTRAVENOUS | Status: DC | PRN
  Administered 2019-12-14: 50 mg via INTRAVENOUS

## 2019-12-14 MED ORDER — STERILE WATER FOR IRRIGATION IR SOLN
Status: DC | PRN
  Administered 2019-12-14: .05 mL

## 2019-12-14 SURGICAL SUPPLY — 8 items
GOWN CVR UNV OPN BCK APRN NK (MISCELLANEOUS) ×4 IMPLANT
GOWN ISOL THUMB LOOP REG UNIV (MISCELLANEOUS) ×6
KIT PRC NS LF DISP ENDO (KITS) ×2 IMPLANT
KIT PROCEDURE OLYMPUS (KITS) ×3
MANIFOLD NEPTUNE II (INSTRUMENTS) ×3 IMPLANT
SNARE SHORT THROW 13M SML OVAL (MISCELLANEOUS) ×3 IMPLANT
TRAP ETRAP POLY (MISCELLANEOUS) ×3 IMPLANT
WATER STERILE IRR 250ML POUR (IV SOLUTION) ×3 IMPLANT

## 2019-12-14 NOTE — H&P (Signed)
Lucilla Lame, MD [address] [place], [place] [address] Phone:[phone] Fax : [phone]  Primary Care Physician:  Juline Patch, MD Primary Gastroenterologist:  Dr. Allen Norris  Pre-Procedure History & Physical: HPI:  Taylor Griffin is a 69 y.o. female is here for an colonoscopy.   Past Medical History:  Diagnosis Date  . Anemia   . Anemia due to folic acid deficiency 0/12/2723  . Arthritis    WRIST AND FINGERS  . Benign neoplasm of ascending colon   . Benign neoplasm of descending colon   . Benign neoplasm of sigmoid colon   . COPD (chronic obstructive pulmonary disease) ([place])   . Esophageal dysphagia   . Gastroesophageal reflux disease 07/11/2017  . Heart burn   . Hyperlipidemia   . Hypertension    CONTROLLED WITH MEDS    Past Surgical History:  Procedure Laterality Date  . COLONOSCOPY WITH PROPOFOL N/A 11/22/2014   Procedure: COLONOSCOPY WITH PROPOFOL;  Surgeon: Lucilla Lame, MD;  Location: [facility];  Service: Endoscopy;  Laterality: N/A;  LATEX ALLERGY  . ECTOPIC PREGNANCY SURGERY    . ESOPHAGOGASTRODUODENOSCOPY (EGD) WITH PROPOFOL N/A 01/26/2017   Procedure: ESOPHAGOGASTRODUODENOSCOPY (EGD) WITH PROPOFOL;  Surgeon: Lin Landsman, MD;  Location: [facility];  Service: Endoscopy;  Laterality: N/A;    Prior to Admission medications   Medication Sig Start Date End Date Taking? Authorizing Provider  aspirin 81 MG tablet Take 1 tablet (81 mg total) by mouth daily. AM 05/03/16  Yes Juline Patch, MD  Calcium Carbonate-Vit D-Min (CALCIUM 1200 PO) Take 1 tablet by mouth daily.   Yes [provider]  cholecalciferol (VITAMIN D3) 25 MCG (1000 UNIT) tablet Take 1,000 Units by mouth daily.   Yes [provider]  ezetimibe (ZETIA) 10 MG tablet TAKE 1 TABLET BY MOUTH EVERY DAY 10/13/19  Yes Juline Patch, MD  fluticasone (FLONASE) 50 MCG/ACT nasal spray Place 2 sprays into the nose daily. Dr A 01/16/18 12/14/19 Yes  [provider]  folic acid ([employer] FOLIC ACID) 366 MCG tablet TAKE 1 TABLET DAILY. PT NEEDS TO TAKE 1 WHOLE PILL 08/20/19  Yes Juline Patch, MD  Glycopyrrolate-Formoterol 9-4.8 MCG/ACT AERO Inhale 2 puffs into the lungs 2 (two) times a day. Dr A 09/12/18  Yes [provider]  hydrochlorothiazide (HYDRODIURIL) 25 MG tablet Take 1 tablet (25 mg total) by mouth daily. 08/20/19  Yes Juline Patch, MD  losartan (COZAAR) 100 MG tablet Take 1 tablet (100 mg total) by mouth daily. 08/20/19  Yes Juline Patch, MD  metoprolol succinate (TOPROL-XL) 50 MG 24 hr tablet Take 1 tablet (50 mg total) by mouth daily. Take with or immediately following a meal. 08/20/19  Yes Juline Patch, MD  Multiple Vitamins-Minerals (MULTIVITAMIN WITH MINERALS) tablet Take 1 tablet by mouth daily.   Yes [provider]  ipratropium-albuterol (DUONEB) 0.5-2.5 (3) MG/3ML SOLN Inhale 3 mLs into the lungs 3 (three) times daily as needed. Dr A 03/20/18 12/14/19  [provider]    Allergies as of 09/26/2019 - Review Complete 09/26/2019  Allergen Reaction Noted  . Latex Itching 11/14/2014    Family History  Problem Relation Age of Onset  . Diabetes Mother   . Hypertension Mother   . Breast cancer Sister 34  . Breast cancer Cousin        2 mat cousins    Social History   Socioeconomic History  . Marital status: Married    Spouse  name: Not on file  . Number of children: 2  . Years of education: Not on file  . Highest education level: Associate degree: academic program  Occupational History  . Not on file  Tobacco Use  . Smoking status: Former Smoker    Packs/day: 0.50    Years: 18.00    Pack years: 9.00    Types: Cigarettes  . Smokeless tobacco: Never Used  Vaping Use  . Vaping Use: Never used  Substance and Sexual Activity  . Alcohol use: No    Alcohol/week: 0.0 standard drinks  . Drug use: No  . Sexual activity: Never  Other Topics Concern  . Not on file  Social History  Narrative  . Not on file   Social Determinants of Health   Financial Resource Strain: Low Risk   . Difficulty of Paying Living Expenses: Not very hard  Food Insecurity: No Food Insecurity  . Worried About Charity fundraiser in the Last Year: Never true  . Ran Out of Food in the Last Year: Never true  Transportation Needs: No Transportation Needs  . Lack of Transportation (Medical): No  . Lack of Transportation (Non-Medical): No  Physical Activity: Inactive  . Days of Exercise per Week: 0 days  . Minutes of Exercise per Session: 0 min  Stress: No Stress Concern Present  . Feeling of Stress : Not at all  Social Connections: Moderately Integrated  . Frequency of Communication with Friends and Family: More than three times a week  . Frequency of Social Gatherings with Friends and Family: More than three times a week  . Attends Religious Services: More than 4 times per year  . Active Member of Clubs or Organizations: No  . Attends Archivist Meetings: Never  . Marital Status: Married  Human resources officer Violence: Not At Risk  . Fear of Current or Ex-Partner: No  . Emotionally Abused: No  . Physically Abused: No  . Sexually Abused: No    Review of Systems: See HPI, otherwise negative ROS  Physical Exam: BP 114/65   Pulse 71   Temp 97.7 F (36.5 C) (Temporal)   Ht 5\' 6"  (1.676 m)   Wt 71.7 kg   SpO2 98%   BMI 25.50 kg/m  General:   Alert,  pleasant and cooperative in NAD Head:  Normocephalic and atraumatic. Neck:  Supple; no masses or thyromegaly. Lungs:  Clear throughout to auscultation.    Heart:  Regular rate and rhythm. Abdomen:  Soft, nontender and nondistended. Normal bowel sounds, without guarding, and without rebound.   Neurologic:  Alert and  oriented x4;  grossly normal neurologically.  Impression/Plan: Taylor Griffin is here for an colonoscopy to be performed for a history of adenomatous polyps on 11/2014  Risks, benefits, limitations, and  alternatives regarding  colonoscopy have been reviewed with the patient.  Questions have been answered.  All parties agreeable.   Lucilla Lame, MD  12/14/2019, 7:17 AM

## 2019-12-14 NOTE — Op Note (Signed)
[facility] Patient Name: Taylor Griffin Procedure Date: 12/14/2019 7:27 AM MRN: [id number] Account #: [id number] Date of Birth: 1950/12/29 Admit Type: Outpatient Age: 69 Room: [facility] OR ROOM 01 Gender: Female Note Status: Finalized Procedure:             Colonoscopy Indications:           High risk colon cancer surveillance: Personal history                         of colonic polyps Providers:             Lucilla Lame MD, MD Referring MD:          Juline Patch, MD (Referring MD) Medicines:             Propofol per Anesthesia Complications:         No immediate complications. Procedure:             Pre-Anesthesia Assessment:                        - Prior to the procedure, a History and Physical was                         performed, and patient medications and allergies were                         reviewed. The patient's tolerance of previous                         anesthesia was also reviewed. The risks and benefits                         of the procedure and the sedation options and risks                         were discussed with the patient. All questions were                         answered, and informed consent was obtained. Prior                         Anticoagulants: The patient has taken no previous                         anticoagulant or antiplatelet agents. ASA Grade                         Assessment: II - A patient with mild systemic disease.                         After reviewing the risks and benefits, the patient                         was deemed in satisfactory condition to undergo the                         procedure.  After obtaining informed consent, the colonoscope was                         passed under direct vision. Throughout the procedure,                         the patient's blood pressure, pulse, and oxygen                         saturations were monitored continuously. The was                          introduced through the anus and advanced to the the                         cecum, identified by appendiceal orifice and ileocecal                         valve. The colonoscopy was performed without                         difficulty. The patient tolerated the procedure well.                         The quality of the bowel preparation was excellent. Findings:      The perianal and digital rectal examinations were normal.      A 3 mm polyp was found in the ascending colon. The polyp was sessile.       The polyp was removed with a cold snare. Resection and retrieval were       complete.      A 2 mm polyp was found in the transverse colon. The polyp was sessile.       The polyp was removed with a cold snare. Resection and retrieval were       complete.      Multiple small-mouthed diverticula were found in the entire colon.      Non-bleeding internal hemorrhoids were found during retroflexion. The       hemorrhoids were Grade I (internal hemorrhoids that do not prolapse). Impression:            - One 3 mm polyp in the ascending colon, removed with                         a cold snare. Resected and retrieved.                        - One 2 mm polyp in the transverse colon, removed with                         a cold snare. Resected and retrieved.                        - Diverticulosis in the entire examined colon.                        - Non-bleeding internal hemorrhoids. Recommendation:        - Discharge patient to home.                        -  Resume previous diet.                        - Continue present medications.                        - Await pathology results.                        - Repeat colonoscopy in 7 years for surveillance. Procedure Code(s):     --- Professional ---                        [phone], Colonoscopy, flexible; with removal of                         tumor(s), polyp(s), or other lesion(s) by snare                         technique Diagnosis  Code(s):     --- Professional ---                        Z86.010, Personal history of colonic polyps                        K63.5, Polyp of colon CPT copyright 2019 American Medical Association. All rights reserved. The codes documented in this report are preliminary and upon coder review may  be revised to meet current compliance requirements. Lucilla Lame MD, MD 12/14/2019 7:52:25 AM This report has been signed electronically. Number of Addenda: 0 Note Initiated On: 12/14/2019 7:27 AM Scope Withdrawal Time: 0 hours 9 minutes 44 seconds  Total Procedure Duration: 0 hours 12 minutes 37 seconds  Estimated Blood Loss:  Estimated blood loss: none.      [facility]

## 2019-12-14 NOTE — Anesthesia Postprocedure Evaluation (Signed)
Anesthesia Post Note  Patient: Taylor Griffin  Procedure(s) Performed: COLONOSCOPY WITH PROPOFOL (N/A Rectum) POLYPECTOMY (Rectum)     Patient location during evaluation: PACU Anesthesia Type: General Level of consciousness: awake and alert Pain management: pain level controlled Vital Signs Assessment: post-procedure vital signs reviewed and stable Respiratory status: spontaneous breathing, nonlabored ventilation, respiratory function stable and patient connected to nasal cannula oxygen Cardiovascular status: blood pressure returned to baseline and stable Postop Assessment: no apparent nausea or vomiting Anesthetic complications: no   No complications documented.  Adele Barthel Nasri Boakye

## 2019-12-14 NOTE — Transfer of Care (Signed)
Immediate Anesthesia Transfer of Care Note  Patient: Taylor Griffin  Procedure(s) Performed: COLONOSCOPY WITH PROPOFOL (N/A Rectum) POLYPECTOMY (Rectum)  Patient Location: PACU  Anesthesia Type: General  Level of Consciousness: awake, alert  and patient cooperative  Airway and Oxygen Therapy: Patient Spontanous Breathing and Patient connected to supplemental oxygen  Post-op Assessment: Post-op Vital signs reviewed, Patient's Cardiovascular Status Stable, Respiratory Function Stable, Patent Airway and No signs of Nausea or vomiting  Post-op Vital Signs: Reviewed and stable  Complications: No complications documented.

## 2019-12-14 NOTE — Anesthesia Preprocedure Evaluation (Signed)
Anesthesia Evaluation  Patient identified by MRN, date of birth, ID band Patient awake    History of Anesthesia Complications Negative for: history of anesthetic complications  Airway Mallampati: I  TM Distance: >3 FB Neck ROM: Full    Dental  (+)    Pulmonary COPD (well controlled, sees pulmonologist regualrly),  COPD inhaler, former smoker,    Pulmonary exam normal        Cardiovascular Exercise Tolerance: Good hypertension, Normal cardiovascular exam     Neuro/Psych negative neurological ROS     GI/Hepatic negative GI ROS, Neg liver ROS,   Endo/Other  negative endocrine ROS  Renal/GU negative Renal ROS     Musculoskeletal   Abdominal   Peds  Hematology negative hematology ROS (+)   Anesthesia Other Findings   Reproductive/Obstetrics                             Anesthesia Physical Anesthesia Plan  ASA: III  Anesthesia Plan: General   Post-op Pain Management:    Induction: Intravenous  PONV Risk Score and Plan: 3 and TIVA, Propofol infusion and Treatment may vary due to age or medical condition  Airway Management Planned: Nasal Cannula and Natural Airway  Additional Equipment: None  Intra-op Plan:   Post-operative Plan:   Informed Consent: I have reviewed the patients History and Physical, chart, labs and discussed the procedure including the risks, benefits and alternatives for the proposed anesthesia with the patient or authorized representative who has indicated his/her understanding and acceptance.       Plan Discussed with: CRNA  Anesthesia Plan Comments:         Anesthesia Quick Evaluation

## 2019-12-14 NOTE — Anesthesia Procedure Notes (Signed)
Procedure Name: General with mask airway Date/Time: 12/14/2019 7:39 AM Performed by: Jeannene Patella, CRNA Pre-anesthesia Checklist: Patient identified, Emergency Drugs available, Suction available, Patient being monitored and Timeout performed Patient Re-evaluated:Patient Re-evaluated prior to induction Oxygen Delivery Method: Nasal cannula

## 2019-12-17 ENCOUNTER — Encounter: Payer: Self-pay | Admitting: Gastroenterology

## 2019-12-17 LAB — SURGICAL PATHOLOGY

## 2019-12-18 ENCOUNTER — Encounter: Payer: Self-pay | Admitting: Gastroenterology

## 2020-01-01 ENCOUNTER — Other Ambulatory Visit: Payer: Self-pay

## 2020-01-01 ENCOUNTER — Ambulatory Visit (INDEPENDENT_AMBULATORY_CARE_PROVIDER_SITE_OTHER): Payer: Medicare HMO

## 2020-01-01 DIAGNOSIS — Z23 Encounter for immunization: Secondary | ICD-10-CM

## 2020-02-05 ENCOUNTER — Ambulatory Visit: Payer: Medicare HMO | Admitting: Family Medicine

## 2020-02-11 ENCOUNTER — Other Ambulatory Visit: Payer: Self-pay

## 2020-02-11 ENCOUNTER — Encounter: Payer: Self-pay | Admitting: Family Medicine

## 2020-02-11 ENCOUNTER — Ambulatory Visit (INDEPENDENT_AMBULATORY_CARE_PROVIDER_SITE_OTHER): Payer: Medicare HMO | Admitting: Family Medicine

## 2020-02-11 VITALS — BP 128/70 | HR 64 | Ht 66.0 in | Wt 163.0 lb

## 2020-02-11 DIAGNOSIS — E785 Hyperlipidemia, unspecified: Secondary | ICD-10-CM

## 2020-02-11 DIAGNOSIS — H6123 Impacted cerumen, bilateral: Secondary | ICD-10-CM | POA: Diagnosis not present

## 2020-02-11 DIAGNOSIS — D529 Folate deficiency anemia, unspecified: Secondary | ICD-10-CM | POA: Diagnosis not present

## 2020-02-11 DIAGNOSIS — I10 Essential (primary) hypertension: Secondary | ICD-10-CM | POA: Diagnosis not present

## 2020-02-11 DIAGNOSIS — Z789 Other specified health status: Secondary | ICD-10-CM

## 2020-02-11 MED ORDER — EZETIMIBE 10 MG PO TABS: 10.0000 mg | ORAL_TABLET | Freq: Every day | ORAL | 1 refills | Status: DC

## 2020-02-11 MED ORDER — FOLIC ACID 800 MCG PO TABS: ORAL_TABLET | ORAL | 1 refills | Status: DC

## 2020-02-11 MED ORDER — METOPROLOL SUCCINATE ER 50 MG PO TB24: 50.0000 mg | ORAL_TABLET | Freq: Every day | ORAL | 1 refills | Status: DC

## 2020-02-11 MED ORDER — HYDROCHLOROTHIAZIDE 25 MG PO TABS: 25.0000 mg | ORAL_TABLET | Freq: Every day | ORAL | 1 refills | Status: DC

## 2020-02-11 MED ORDER — LOSARTAN POTASSIUM 100 MG PO TABS: 100.0000 mg | ORAL_TABLET | Freq: Every day | ORAL | 1 refills | Status: DC

## 2020-02-11 NOTE — Progress Notes (Signed)
Date:  02/11/2020   Name:  Taylor Griffin   DOB:  January 16, 1951   MRN:  [id number]   Chief Complaint: Hyperlipidemia, Anemia, and Hypertension  Hyperlipidemia This is a chronic problem. The current episode started more than 1 year ago. The problem is controlled. Recent lipid tests were reviewed and are normal. She has no history of chronic renal disease, diabetes, hypothyroidism, liver disease, obesity or nephrotic syndrome. Pertinent negatives include no chest pain, focal sensory loss, focal weakness, leg pain, myalgias or shortness of breath. Current antihyperlipidemic treatment includes ezetimibe. The current treatment provides moderate improvement of lipids. There are no compliance problems.  Risk factors for coronary artery disease include dyslipidemia and hypertension.  Anemia Presents for follow-up visit. There has been no abdominal pain, anorexia, bruising/bleeding easily, confusion, fever, leg swelling, light-headedness, malaise/fatigue, pallor, palpitations or paresthesias. Signs of blood loss that are not present include hematemesis, hematochezia, melena, menorrhagia and vaginal bleeding. There is no history of chronic renal disease, heart failure or hypothyroidism.  Hypertension This is a chronic problem. The current episode started more than 1 year ago. The problem has been gradually improving since onset. The problem is controlled. Pertinent negatives include no anxiety, blurred vision, chest pain, headaches, malaise/fatigue, neck pain, orthopnea, palpitations, peripheral edema, PND, shortness of breath or sweats. There are no associated agents to hypertension. Risk factors for coronary artery disease include dyslipidemia. Past treatments include ACE inhibitors, beta blockers and diuretics. The current treatment provides moderate improvement. There are no compliance problems.  There is no history of angina, kidney disease, CAD/MI, CVA, heart failure, left ventricular hypertrophy, PVD or  retinopathy. There is no history of chronic renal disease, a hypertension causing med or renovascular disease.    Lab Results  Component Value Date   CREATININE 1.20 (H) 02/19/2019   BUN 23 02/19/2019   NA 143 02/19/2019   K 4.4 02/19/2019   CL 102 02/19/2019   CO2 23 02/19/2019   Lab Results  Component Value Date   CHOL 212 (H) 10/15/2019   HDL 41 10/15/2019   LDLCALC 140 (H) 10/15/2019   TRIG 173 (H) 10/15/2019   CHOLHDL 5.2 (H) 07/11/2017   No results found for: TSH No results found for: HGBA1C Lab Results  Component Value Date   WBC 5.3 02/19/2019   HGB 14.6 02/19/2019   HCT 42.2 02/19/2019   MCV 91 02/19/2019   PLT 308 02/19/2019   Lab Results  Component Value Date   ALT 20 10/15/2019   AST 23 10/15/2019   ALKPHOS 70 10/15/2019   BILITOT 0.6 10/15/2019     Review of Systems  Constitutional: Negative.  Negative for chills, fatigue, fever, malaise/fatigue and unexpected weight change.  HENT: Negative for congestion, ear discharge, ear pain, rhinorrhea, sinus pressure, sneezing and sore throat.   Eyes: Negative for blurred vision, photophobia, pain, discharge, redness and itching.  Respiratory: Negative for cough, shortness of breath, wheezing and stridor.   Cardiovascular: Negative for chest pain, palpitations, orthopnea and PND.  Gastrointestinal: Negative for abdominal pain, anorexia, blood in stool, constipation, diarrhea, hematemesis, hematochezia, melena, nausea and vomiting.  Endocrine: Negative for cold intolerance, heat intolerance, polydipsia, polyphagia and polyuria.  Genitourinary: Negative for dysuria, flank pain, frequency, hematuria, menorrhagia, menstrual problem, pelvic pain, urgency, vaginal bleeding and vaginal discharge.  Musculoskeletal: Negative for arthralgias, back pain, myalgias and neck pain.  Skin: Negative for pallor and rash.  Allergic/Immunologic: Negative for environmental allergies and food allergies.  Neurological: Negative for  dizziness, focal weakness,  weakness, light-headedness, numbness, headaches and paresthesias.  Hematological: Negative for adenopathy. Does not bruise/bleed easily.  Psychiatric/Behavioral: Negative for confusion and dysphoric mood. The patient is not nervous/anxious.     Patient Active Problem List   Diagnosis Date Noted  . Centrilobular emphysema ([place]) 08/21/2018  . Dyshidrotic eczema 07/11/2017  . Hyperlipidemia, unspecified 05/03/2016  . Hypertension 05/03/2016    Allergies  Allergen Reactions  . Latex Itching    Past Surgical History:  Procedure Laterality Date  . COLONOSCOPY WITH PROPOFOL N/A 11/22/2014   Procedure: COLONOSCOPY WITH PROPOFOL;  Surgeon: Lucilla Lame, MD;  Location: [place];  Service: Endoscopy;  Laterality: N/A;  LATEX ALLERGY  . COLONOSCOPY WITH PROPOFOL N/A 12/14/2019   Procedure: COLONOSCOPY WITH PROPOFOL;  Surgeon: Lucilla Lame, MD;  Location: [place];  Service: Endoscopy;  Laterality: N/A;  priority 4  . ECTOPIC PREGNANCY SURGERY    . ESOPHAGOGASTRODUODENOSCOPY (EGD) WITH PROPOFOL N/A 01/26/2017   Procedure: ESOPHAGOGASTRODUODENOSCOPY (EGD) WITH PROPOFOL;  Surgeon: Lin Landsman, MD;  Location: [place];  Service: Endoscopy;  Laterality: N/A;  . POLYPECTOMY  12/14/2019   Procedure: POLYPECTOMY;  Surgeon: Lucilla Lame, MD;  Location[facility];  Service: Endoscopy;;    Social History   Tobacco Use  . Smoking status: Former Smoker    Packs/day: 0.50    Years: 18.00    Pack years: 9.00    Types: Cigarettes  . Smokeless tobacco: Never Used  Vaping Use  . Vaping Use: Never used  Substance Use Topics  . Alcohol use: No    Alcohol/week: 0.0 standard drinks  . Drug use: No     Medication list has been reviewed and updated.  Current Meds  Medication Sig  . aspirin 81 MG tablet Take 1 tablet (81 mg total) by mouth daily. AM  . Calcium Carbonate-Vit D-Min (CALCIUM 1200 PO) Take 1 tablet by mouth daily.   . cholecalciferol (VITAMIN D3) 25 MCG (1000 UNIT) tablet Take 1,000 Units by mouth daily.  Marland Kitchen ezetimibe (ZETIA) 10 MG tablet TAKE 1 TABLET BY MOUTH EVERY DAY  . fluticasone (FLONASE) 50 MCG/ACT nasal spray Place 2 sprays into the nose daily. Dr A  . folic acid ([employer] FOLIC ACID) 267 MCG tablet TAKE 1 TABLET DAILY. PT NEEDS TO TAKE 1 WHOLE PILL  . hydrochlorothiazide (HYDRODIURIL) 25 MG tablet Take 1 tablet (25 mg total) by mouth daily.  Marland Kitchen losartan (COZAAR) 100 MG tablet Take 1 tablet (100 mg total) by mouth daily.  . metoprolol succinate (TOPROL-XL) 50 MG 24 hr tablet Take 1 tablet (50 mg total) by mouth daily. Take with or immediately following a meal.  . Multiple Vitamins-Minerals (MULTIVITAMIN WITH MINERALS) tablet Take 1 tablet by mouth daily.  Marland Kitchen omeprazole (PRILOSEC) 40 MG capsule Take 40 mg by mouth daily.  . [DISCONTINUED] pantoprazole (PROTONIX) 40 MG tablet Take 40 mg by mouth daily.    PHQ 2/9 Scores 02/11/2020 09/19/2019 09/12/2019 08/20/2019  PHQ - 2 Score 0 0 0 0  PHQ- 9 Score 0 0 - 0    GAD 7 : Generalized Anxiety Score 02/11/2020 09/19/2019 08/20/2019 02/19/2019  Nervous, Anxious, on Edge 0 0 0 0  Control/stop worrying 0 0 0 0  Worry too much - different things 0 0 0 0  Trouble relaxing 0 0 0 0  Restless 0 0 0 0  Easily annoyed or irritable 0 0 0 0  Afraid - awful might happen 0 0 0 0  Total GAD 7 Score 0 0  0 0    BP Readings from Last 3 Encounters:  02/11/20 128/70  12/14/19 (!) 120/58  09/19/19 120/60    Physical Exam Vitals and nursing note reviewed.  Constitutional:      Appearance: She is well-developed.  HENT:     Head: Normocephalic.     Right Ear: External ear normal. There is impacted cerumen.     Left Ear: External ear normal. There is impacted cerumen.     Mouth/Throat:     Mouth: Mucous membranes are moist.  Eyes:     General: Lids are everted, no foreign bodies appreciated. No scleral icterus.       Left eye: No foreign body or hordeolum.      Conjunctiva/sclera: Conjunctivae normal.     Right eye: Right conjunctiva is not injected.     Left eye: Left conjunctiva is not injected.     Pupils: Pupils are equal, round, and reactive to light.  Neck:     Thyroid: No thyromegaly.     Vascular: No JVD.     Trachea: No tracheal deviation.  Cardiovascular:     Rate and Rhythm: Normal rate and regular rhythm.     Heart sounds: Normal heart sounds. No murmur heard.  No friction rub. No gallop.   Pulmonary:     Effort: Pulmonary effort is normal. No respiratory distress.     Breath sounds: Normal breath sounds. No wheezing or rales.  Abdominal:     General: Bowel sounds are normal.     Palpations: Abdomen is soft. There is no mass.     Tenderness: There is no abdominal tenderness. There is no guarding or rebound.  Musculoskeletal:        General: No tenderness. Normal range of motion.     Cervical back: Normal range of motion and neck supple.  Lymphadenopathy:     Cervical: No cervical adenopathy.  Skin:    General: Skin is warm.     Findings: No rash.  Neurological:     Mental Status: She is alert and oriented to person, place, and time.     Cranial Nerves: No cranial nerve deficit.     Deep Tendon Reflexes: Reflexes normal.  Psychiatric:        Mood and Affect: Mood is not anxious or depressed.     Wt Readings from Last 3 Encounters:  02/11/20 163 lb (73.9 kg)  12/14/19 158 lb (71.7 kg)  09/19/19 165 lb (74.8 kg)    BP 128/70   Pulse 64   Ht 5\' 6"  (1.676 m)   Wt 163 lb (73.9 kg)   BMI 26.31 kg/m   Assessment and Plan: 1. Essential hypertension Chronic.  Controlled.  Stable.  Continue hydrochlorothiazide 25 mg, losartan 100 mg once a day, and metoprolol XL 50 mg once a day.  Will check CMP for electrolytes and current GFR. - Comprehensive Metabolic Panel (CMET) - hydrochlorothiazide (HYDRODIURIL) 25 MG tablet; Take 1 tablet (25 mg total) by mouth daily.  Dispense: 90 tablet; Refill: 1 - losartan (COZAAR) 100 MG  tablet; Take 1 tablet (100 mg total) by mouth daily.  Dispense: 90 tablet; Refill: 1 - metoprolol succinate (TOPROL-XL) 50 MG 24 hr tablet; Take 1 tablet (50 mg total) by mouth daily. Take with or immediately following a meal.  Dispense: 90 tablet; Refill: 1  2. Hyperlipidemia, unspecified hyperlipidemia type Chronic.  Controlled.  Stable.  Patient is currently on Zetia 10 mg once a day and is tolerating medication well.  Will check lipid panel for current status. - Lipid Panel With LDL/HDL Ratio - ezetimibe (ZETIA) 10 MG tablet; Take 1 tablet (10 mg total) by mouth daily.  Dispense: 90 tablet; Refill: 1  3. Anemia due to folic acid deficiency, unspecified deficiency type Chronic.  Controlled.  Stable.  Continue folic acid 078 mcg daily.  Will check CBC for current status. - folic acid ([employer]S FOLIC ACID) 675 MCG tablet; TAKE 1 TABLET DAILY. PT NEEDS TO TAKE 1 WHOLE PILL  Dispense: 90 tablet; Refill: 1 - CBC w/Diff/Platelet  4. Bilateral impacted cerumen Acute.  Episodic.  Patient has cerumen impaction bilateral.  Areas were irrigated with removal of left ceruminal impaction and patient will use Debrox on the right for further removal in the future.  5. Statin intolerance Patient has a history of statin intolerance secondary to myalgias.  We will continue with the Zetia and will recheck and it was suggested to the patient that we may initiate a very low-dose Crestor if not sufficiently controlled.

## 2020-02-12 ENCOUNTER — Ambulatory Visit: Payer: Medicare HMO | Attending: Internal Medicine

## 2020-02-12 DIAGNOSIS — Z23 Encounter for immunization: Secondary | ICD-10-CM

## 2020-02-12 LAB — COMPREHENSIVE METABOLIC PANEL
ALT: 23 IU/L (ref 0–32)
AST: 24 IU/L (ref 0–40)
Albumin/Globulin Ratio: 1.8 (ref 1.2–2.2)
Albumin: 4.7 g/dL (ref 3.8–4.8)
Alkaline Phosphatase: 79 IU/L (ref 44–121)
BUN/Creatinine Ratio: 16 (ref 12–28)
BUN: 22 mg/dL (ref 8–27)
Bilirubin Total: 0.9 mg/dL (ref 0.0–1.2)
CO2: 28 mmol/L (ref 20–29)
Calcium: 10.7 mg/dL — ABNORMAL HIGH (ref 8.7–10.3)
Chloride: 100 mmol/L (ref 96–106)
Creatinine, Ser: 1.34 mg/dL — ABNORMAL HIGH (ref 0.57–1.00)
GFR calc Af Amer: 46 mL/min/{1.73_m2} — ABNORMAL LOW (ref >59)
GFR calc non Af Amer: 40 mL/min/{1.73_m2} — ABNORMAL LOW (ref >59)
Globulin, Total: 2.6 g/dL (ref 1.5–4.5)
Glucose: 94 mg/dL (ref 65–99)
Potassium: 4 mmol/L (ref 3.5–5.2)
Sodium: 144 mmol/L (ref 134–144)
Total Protein: 7.3 g/dL (ref 6.0–8.5)

## 2020-02-12 LAB — CBC WITH DIFFERENTIAL/PLATELET
Basophils Absolute: 0.1 10*3/uL (ref 0.0–0.2)
Basos: 1 %
EOS (ABSOLUTE): 0.2 10*3/uL (ref 0.0–0.4)
Eos: 3 %
Hematocrit: 43.4 % (ref 34.0–46.6)
Hemoglobin: 14.7 g/dL (ref 11.1–15.9)
Immature Grans (Abs): 0 10*3/uL (ref 0.0–0.1)
Immature Granulocytes: 0 %
Lymphocytes Absolute: 2.6 10*3/uL (ref 0.7–3.1)
Lymphs: 41 %
MCH: 30.8 pg (ref 26.6–33.0)
MCHC: 33.9 g/dL (ref 31.5–35.7)
MCV: 91 fL (ref 79–97)
Monocytes Absolute: 0.5 10*3/uL (ref 0.1–0.9)
Monocytes: 8 %
Neutrophils Absolute: 3 10*3/uL (ref 1.4–7.0)
Neutrophils: 47 %
Platelets: 446 10*3/uL (ref 150–450)
RBC: 4.78 x10E6/uL (ref 3.77–5.28)
RDW: 12.8 % (ref 11.7–15.4)
WBC: 6.4 10*3/uL (ref 3.4–10.8)

## 2020-02-12 LAB — LIPID PANEL WITH LDL/HDL RATIO
Cholesterol, Total: 225 mg/dL — ABNORMAL HIGH (ref 100–199)
HDL: 43 mg/dL (ref >39)
LDL Chol Calc (NIH): 149 mg/dL — ABNORMAL HIGH (ref 0–99)
LDL/HDL Ratio: 3.5 ratio — ABNORMAL HIGH (ref 0.0–3.2)
Triglycerides: 179 mg/dL — ABNORMAL HIGH (ref 0–149)
VLDL Cholesterol Cal: 33 mg/dL (ref 5–40)

## 2020-02-12 NOTE — Progress Notes (Signed)
   Covid-19 Vaccination Clinic  Name:  SHEMICKA COHRS    MRN: [id number] DOB: 04-08-1950  02/12/2020  Ms. Pankowski was observed post Covid-19 immunization for 15 minutes without incident. She was provided with Vaccine Information Sheet and instruction to access the V-Safe system.   Ms. Stiver was instructed to call 911 with any severe reactions post vaccine: Marland Kitchen Difficulty breathing  . Swelling of face and throat  . A fast heartbeat  . A bad rash all over body  . Dizziness and weakness   Immunizations Administered    No immunizations on file.

## 2020-05-05 DIAGNOSIS — J449 Chronic obstructive pulmonary disease, unspecified: Secondary | ICD-10-CM | POA: Diagnosis not present

## 2020-05-21 ENCOUNTER — Other Ambulatory Visit: Payer: Self-pay | Admitting: Family Medicine

## 2020-05-21 DIAGNOSIS — D529 Folate deficiency anemia, unspecified: Secondary | ICD-10-CM

## 2020-07-29 IMAGING — MG DIGITAL SCREENING BILATERAL MAMMOGRAM WITH TOMO AND CAD
8 series · 8 of 24 positions shown · non-contrast
Comparison: Previous exam(s).

CLINICAL DATA: Screening.

EXAM:
DIGITAL SCREENING BILATERAL MAMMOGRAM WITH TOMO AND CAD

[L CC synth-2D]
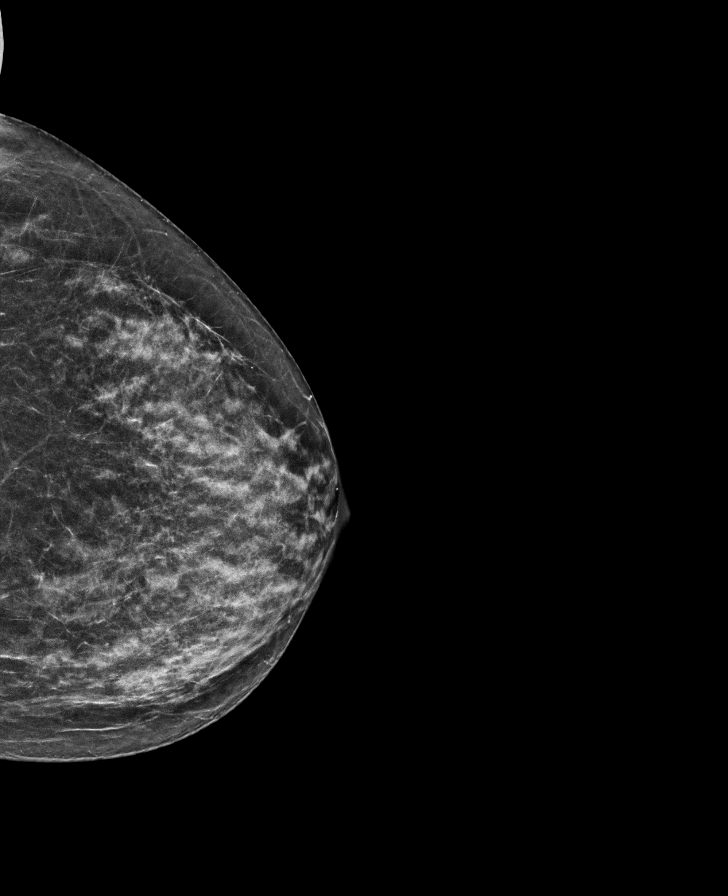

[R MLO synth-2D]
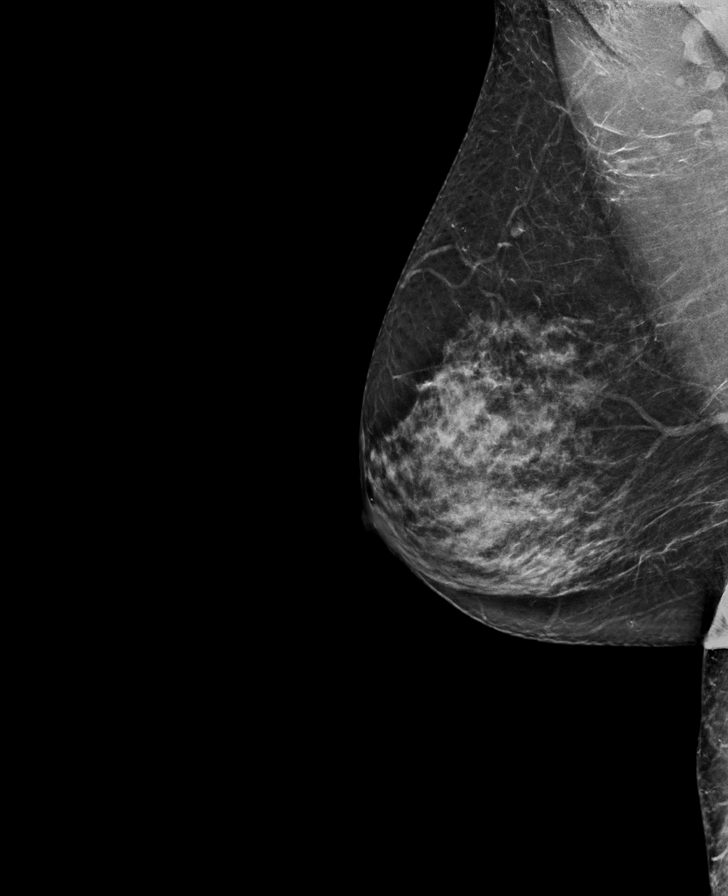

[R CC synth-2D]
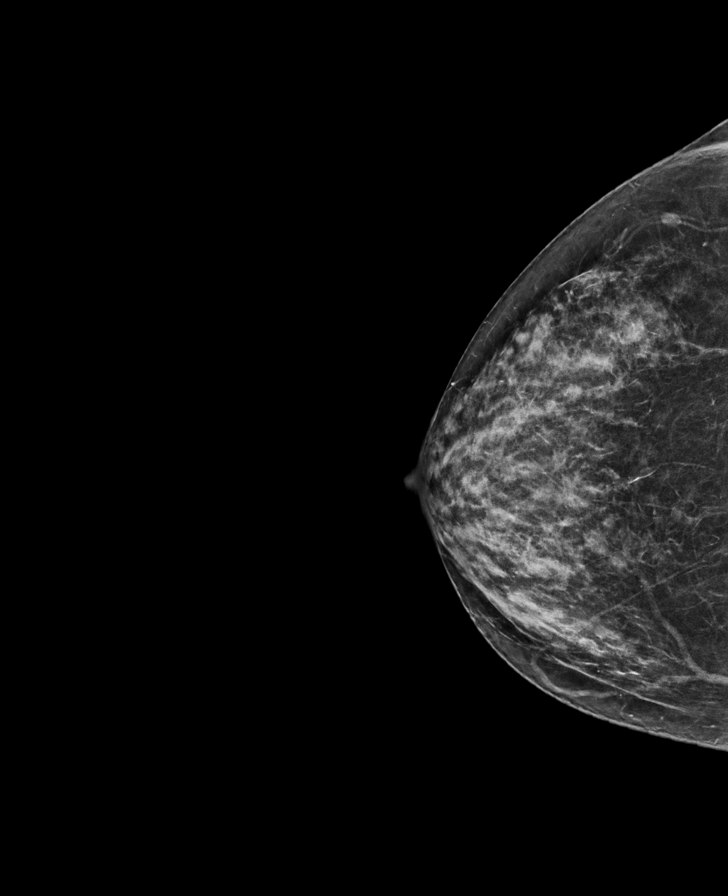

[L MLO synth-2D]
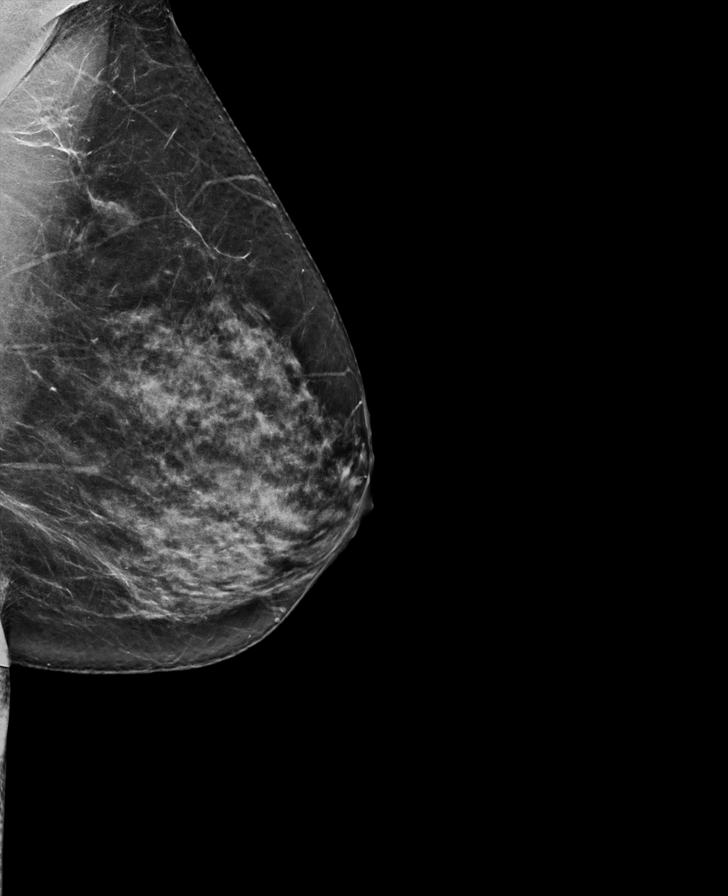

[L MLO tomo · tomo slice 35/68.0]
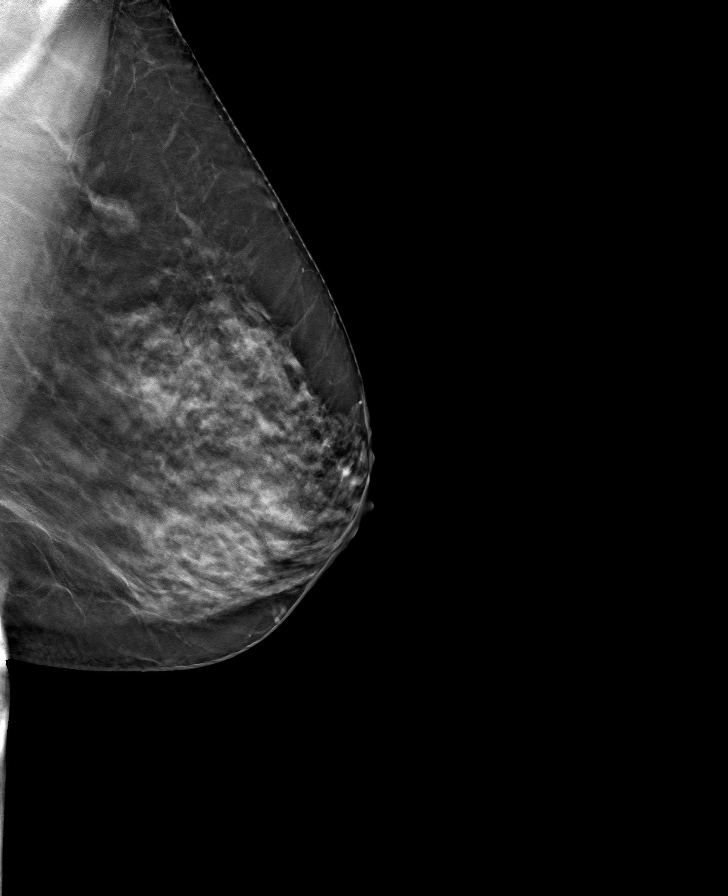

[R MLO tomo · tomo slice 33/66.0]
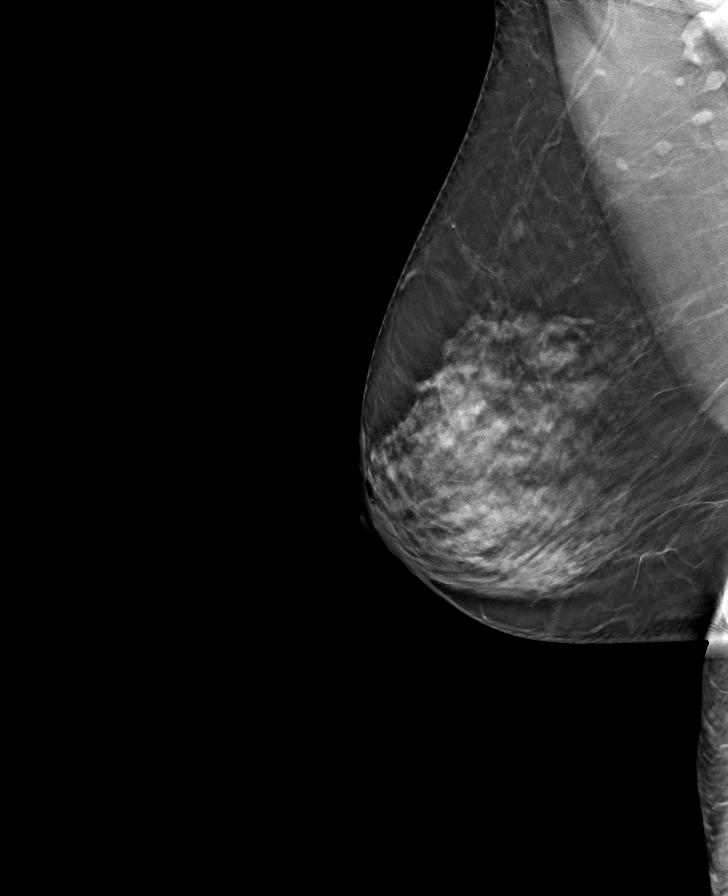

[R CC tomo · tomo slice 29/58.0]
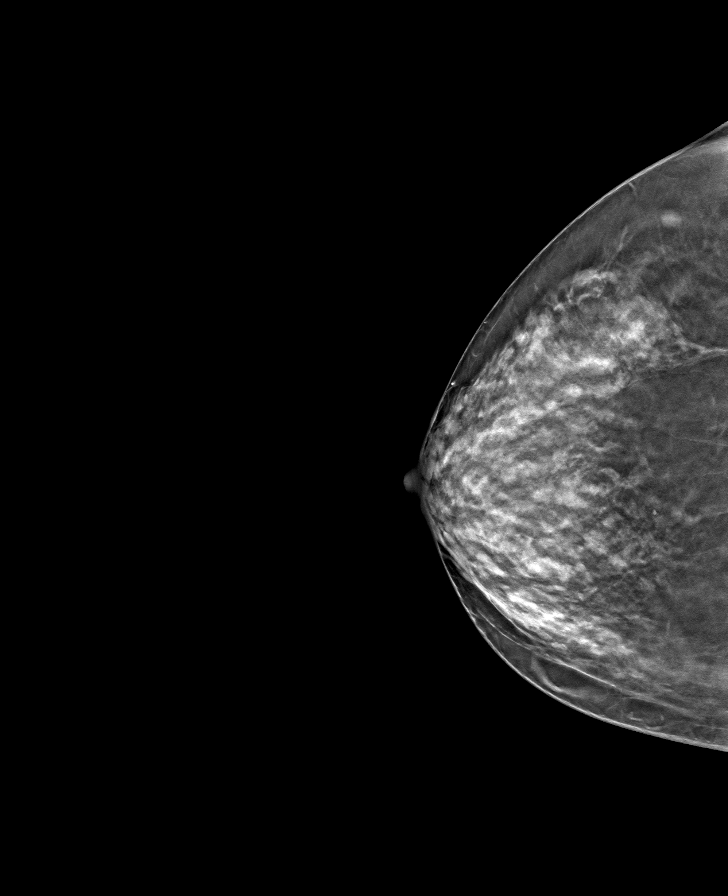

[L CC tomo · tomo slice 29/58.0]
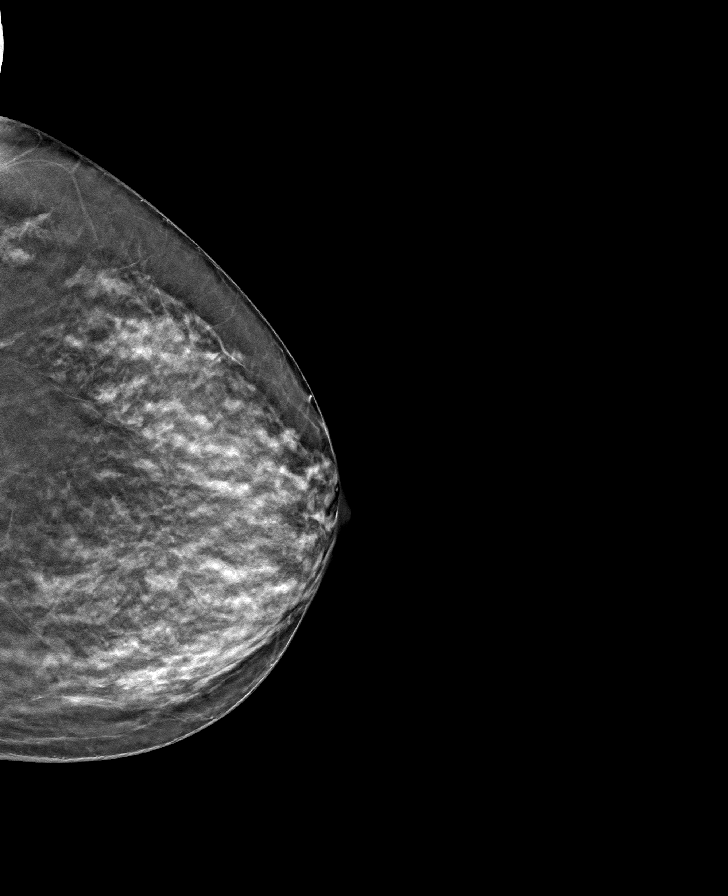

[8 of 24 positions shown; findings below may reference images not displayed]

ACR Breast Density Category c: The breast tissue is heterogeneously
dense, which may obscure small masses.
FINDINGS: There are no findings suspicious for malignancy. Images were
processed with CAD.
IMPRESSION: No mammographic evidence of malignancy. A result letter of this
screening mammogram will be mailed directly to the patient.

RECOMMENDATION:
Screening mammogram in one year. (Code:[id number])

BI-RADS CATEGORY  1: Negative.

## 2020-08-09 ENCOUNTER — Other Ambulatory Visit: Payer: Self-pay | Admitting: Family Medicine

## 2020-08-09 DIAGNOSIS — E785 Hyperlipidemia, unspecified: Secondary | ICD-10-CM

## 2020-08-09 NOTE — Telephone Encounter (Signed)
Requested Prescriptions  Pending Prescriptions Disp Refills  . ezetimibe (ZETIA) 10 MG tablet [Pharmacy Med Name: EZETIMIBE 10 MG TABLET] 90 tablet 1    Sig: TAKE 1 TABLET BY MOUTH EVERY DAY     Cardiovascular:  Antilipid - Sterol Transport Inhibitors Failed - 08/09/2020  9:53 AM      Failed - Total Cholesterol in normal range and within 360 days    Cholesterol, Total  Date Value Ref Range Status  02/11/2020 225 (H) 100 - 199 mg/dL Final         Failed - LDL in normal range and within 360 days    LDL Chol Calc (NIH)  Date Value Ref Range Status  02/11/2020 149 (H) 0 - 99 mg/dL Final         Failed - Triglycerides in normal range and within 360 days    Triglycerides  Date Value Ref Range Status  02/11/2020 179 (H) 0 - 149 mg/dL Final         Passed - HDL in normal range and within 360 days    HDL  Date Value Ref Range Status  02/11/2020 43 >39 mg/dL Final         Passed - Valid encounter within last 12 months    Recent Outpatient Visits          6 months ago Essential hypertension   [place], Deanna C, MD   10 months ago Statin intolerance   [facility] Juline Patch, MD   11 months ago Anemia due to folic acid deficiency, unspecified deficiency type   [facility] Juline Patch, MD   1 year ago Anemia due to folic acid deficiency, unspecified deficiency type   [facility] Juline Patch, MD   1 year ago Essential hypertension   [facility] Juline Patch, MD

## 2020-08-18 ENCOUNTER — Other Ambulatory Visit: Payer: Self-pay | Admitting: Family Medicine

## 2020-08-18 ENCOUNTER — Other Ambulatory Visit: Payer: Self-pay

## 2020-08-18 DIAGNOSIS — I10 Essential (primary) hypertension: Secondary | ICD-10-CM

## 2020-08-18 MED ORDER — METOPROLOL SUCCINATE ER 50 MG PO TB24: 50.0000 mg | ORAL_TABLET | Freq: Every day | ORAL | 0 refills | Status: DC

## 2020-08-25 DIAGNOSIS — I10 Essential (primary) hypertension: Secondary | ICD-10-CM | POA: Diagnosis not present

## 2020-08-25 DIAGNOSIS — M1990 Unspecified osteoarthritis, unspecified site: Secondary | ICD-10-CM | POA: Diagnosis not present

## 2020-08-25 DIAGNOSIS — Z809 Family history of malignant neoplasm, unspecified: Secondary | ICD-10-CM | POA: Diagnosis not present

## 2020-08-25 DIAGNOSIS — Z87891 Personal history of nicotine dependence: Secondary | ICD-10-CM | POA: Diagnosis not present

## 2020-08-25 DIAGNOSIS — J439 Emphysema, unspecified: Secondary | ICD-10-CM | POA: Diagnosis not present

## 2020-08-25 DIAGNOSIS — Z8249 Family history of ischemic heart disease and other diseases of the circulatory system: Secondary | ICD-10-CM | POA: Diagnosis not present

## 2020-08-25 DIAGNOSIS — Z7982 Long term (current) use of aspirin: Secondary | ICD-10-CM | POA: Diagnosis not present

## 2020-08-25 DIAGNOSIS — H547 Unspecified visual loss: Secondary | ICD-10-CM | POA: Diagnosis not present

## 2020-08-25 DIAGNOSIS — E785 Hyperlipidemia, unspecified: Secondary | ICD-10-CM | POA: Diagnosis not present

## 2020-09-05 ENCOUNTER — Other Ambulatory Visit: Payer: Self-pay | Admitting: Family Medicine

## 2020-09-05 DIAGNOSIS — I10 Essential (primary) hypertension: Secondary | ICD-10-CM

## 2020-09-10 ENCOUNTER — Other Ambulatory Visit: Payer: Self-pay | Admitting: Internal Medicine

## 2020-09-10 ENCOUNTER — Other Ambulatory Visit: Payer: Self-pay

## 2020-09-10 DIAGNOSIS — I10 Essential (primary) hypertension: Secondary | ICD-10-CM

## 2020-09-10 MED ORDER — METOPROLOL SUCCINATE ER 50 MG PO TB24: 50.0000 mg | ORAL_TABLET | Freq: Every day | ORAL | 0 refills | Status: DC

## 2020-09-10 NOTE — Telephone Encounter (Signed)
Sent in- completed

## 2020-09-10 NOTE — Telephone Encounter (Signed)
   Notes to clinic:  Patient has a appt scheduled for 10/01/2020 with nurse Review for refill until that time    Requested Prescriptions  Pending Prescriptions Disp Refills   metoprolol succinate (TOPROL-XL) 50 MG 24 hr tablet [Pharmacy Med Name: METOPROLOL SUCC ER 50 MG TAB] 30 tablet 0    Sig: TAKE 1 TABLET BY MOUTH DAILY. TAKE WITH OR IMMEDIATELY FOLLOWING A MEAL.      Cardiovascular:  Beta Blockers Failed - 09/10/2020  9:34 AM      Failed - Valid encounter within last 6 months    Recent Outpatient Visits           7 months ago Essential hypertension   [facility] Juline Patch, MD   11 months ago Statin intolerance   [facility] Juline Patch, MD   1 year ago Anemia due to folic acid deficiency, unspecified deficiency type   [facility] Juline Patch, MD   1 year ago Anemia due to folic acid deficiency, unspecified deficiency type   [facility] Juline Patch, MD   2 years ago Essential hypertension   [place], Deanna C, MD                Passed - Last BP in normal range    BP Readings from Last 1 Encounters:  02/11/20 128/70          Passed - Last Heart Rate in normal range    Pulse Readings from Last 1 Encounters:  02/11/20 64

## 2020-09-15 ENCOUNTER — Ambulatory Visit: Payer: Medicare HMO

## 2020-09-18 ENCOUNTER — Encounter: Payer: Self-pay | Admitting: Family Medicine

## 2020-09-18 ENCOUNTER — Ambulatory Visit (INDEPENDENT_AMBULATORY_CARE_PROVIDER_SITE_OTHER): Payer: Medicare HMO | Admitting: Family Medicine

## 2020-09-18 ENCOUNTER — Encounter: Payer: Self-pay | Admitting: *Deleted

## 2020-09-18 ENCOUNTER — Other Ambulatory Visit: Payer: Self-pay

## 2020-09-18 VITALS — BP 128/80 | HR 78 | Ht 68.0 in | Wt 172.0 lb

## 2020-09-18 DIAGNOSIS — Z789 Other specified health status: Secondary | ICD-10-CM

## 2020-09-18 DIAGNOSIS — D529 Folate deficiency anemia, unspecified: Secondary | ICD-10-CM | POA: Diagnosis not present

## 2020-09-18 DIAGNOSIS — E785 Hyperlipidemia, unspecified: Secondary | ICD-10-CM | POA: Diagnosis not present

## 2020-09-18 DIAGNOSIS — R1319 Other dysphagia: Secondary | ICD-10-CM | POA: Diagnosis not present

## 2020-09-18 DIAGNOSIS — I10 Essential (primary) hypertension: Secondary | ICD-10-CM | POA: Diagnosis not present

## 2020-09-18 MED ORDER — HYDROCHLOROTHIAZIDE 25 MG PO TABS: 25.0000 mg | ORAL_TABLET | Freq: Every day | ORAL | 1 refills | Status: DC

## 2020-09-18 MED ORDER — FOLIC ACID 800 MCG PO TABS: ORAL_TABLET | ORAL | 1 refills | Status: DC

## 2020-09-18 MED ORDER — LOSARTAN POTASSIUM 100 MG PO TABS: 100.0000 mg | ORAL_TABLET | Freq: Every day | ORAL | 1 refills | Status: DC

## 2020-09-18 MED ORDER — METOPROLOL SUCCINATE ER 50 MG PO TB24: 50.0000 mg | ORAL_TABLET | Freq: Every day | ORAL | 1 refills | Status: DC

## 2020-09-18 NOTE — Progress Notes (Signed)
Date:  09/18/2020   Name:  Taylor Griffin   DOB:  October 22, 1950   MRN:  [id number]   Chief Complaint: Hypertension, Hyperlipidemia, and Anemia  Hypertension This is a chronic problem. The current episode started more than 1 year ago. The problem has been gradually improving since onset. The problem is controlled. Pertinent negatives include no anxiety, blurred vision, chest pain, headaches, malaise/fatigue, neck pain, orthopnea, palpitations, peripheral edema, PND, shortness of breath or sweats. There are no associated agents to hypertension. Past treatments include diuretics, beta blockers and angiotensin blockers. The current treatment provides moderate improvement. There are no compliance problems.  There is no history of angina, kidney disease, CAD/MI, CVA, heart failure, left ventricular hypertrophy, PVD or retinopathy. There is no history of chronic renal disease, a hypertension causing med or renovascular disease.  Hyperlipidemia This is a chronic problem. The current episode started more than 1 year ago. The problem is controlled. Recent lipid tests were reviewed and are normal. She has no history of chronic renal disease, diabetes, hypothyroidism, liver disease, obesity or nephrotic syndrome. There are no known factors aggravating her hyperlipidemia. Pertinent negatives include no chest pain, focal sensory loss, focal weakness, leg pain, myalgias or shortness of breath. Current antihyperlipidemic treatment includes ezetimibe. The current treatment provides moderate improvement of lipids. There are no compliance problems.  Risk factors for coronary artery disease include hypertension and dyslipidemia.  Anemia Presents for follow-up visit. There has been no abdominal pain, anorexia, bruising/bleeding easily, fever, malaise/fatigue, palpitations or weight loss. Signs of blood loss that are not present include hematemesis, hematochezia and melena. There is no history of chronic renal disease,  heart failure or hypothyroidism.  GI Problem Primary symptoms do not include fever, weight loss, fatigue, abdominal pain, nausea, vomiting, diarrhea, melena, hematemesis, jaundice, hematochezia, dysuria, myalgias, arthralgias or rash.  The illness is also significant for dysphagia. The illness does not include chills, anorexia, constipation or back pain. Associated symptoms comments: History schatzi ring. Associated medical issues do not include liver disease.   Lab Results  Component Value Date   CREATININE 1.34 (H) 02/11/2020   BUN 22 02/11/2020   NA 144 02/11/2020   K 4.0 02/11/2020   CL 100 02/11/2020   CO2 28 02/11/2020   Lab Results  Component Value Date   CHOL 225 (H) 02/11/2020   HDL 43 02/11/2020   LDLCALC 149 (H) 02/11/2020   TRIG 179 (H) 02/11/2020   CHOLHDL 5.2 (H) 07/11/2017   No results found for: TSH No results found for: HGBA1C Lab Results  Component Value Date   WBC 6.4 02/11/2020   HGB 14.7 02/11/2020   HCT 43.4 02/11/2020   MCV 91 02/11/2020   PLT 446 02/11/2020   Lab Results  Component Value Date   ALT 23 02/11/2020   AST 24 02/11/2020   ALKPHOS 79 02/11/2020   BILITOT 0.9 02/11/2020     Review of Systems  Constitutional:  Negative for chills, fatigue, fever, malaise/fatigue and weight loss.  HENT:  Negative for drooling, ear discharge, ear pain and sore throat.   Eyes:  Negative for blurred vision.  Respiratory:  Negative for cough, shortness of breath and wheezing.   Cardiovascular:  Negative for chest pain, palpitations, orthopnea, leg swelling and PND.  Gastrointestinal:  Positive for dysphagia. Negative for abdominal pain, anorexia, blood in stool, constipation, diarrhea, hematemesis, hematochezia, jaundice, melena, nausea and vomiting.  Endocrine: Negative for polydipsia.  Genitourinary:  Negative for dysuria, frequency, hematuria and urgency.  Musculoskeletal:  Negative for arthralgias, back pain, myalgias and neck pain.  Skin:  Negative  for rash.  Allergic/Immunologic: Negative for environmental allergies.  Neurological:  Negative for dizziness, focal weakness and headaches.  Hematological:  Does not bruise/bleed easily.  Psychiatric/Behavioral:  Negative for suicidal ideas. The patient is not nervous/anxious.    Patient Active Problem List   Diagnosis Date Noted   Centrilobular emphysema ([place]) 08/21/2018   Dyshidrotic eczema 07/11/2017   Hyperlipidemia, unspecified 05/03/2016   Hypertension 05/03/2016    Allergies  Allergen Reactions   Latex Itching    Past Surgical History:  Procedure Laterality Date   COLONOSCOPY WITH PROPOFOL N/A 11/22/2014   Procedure: COLONOSCOPY WITH PROPOFOL;  Surgeon: Lucilla Lame, MD;  Location: [place];  Service: Endoscopy;  Laterality: N/A;  LATEX ALLERGY   COLONOSCOPY WITH PROPOFOL N/A 12/14/2019   Procedure: COLONOSCOPY WITH PROPOFOL;  Surgeon: Lucilla Lame, MD;  Location: [place];  Service: Endoscopy;  Laterality: N/A;  priority 4   ECTOPIC PREGNANCY SURGERY     ESOPHAGOGASTRODUODENOSCOPY (EGD) WITH PROPOFOL N/A 01/26/2017   Procedure: ESOPHAGOGASTRODUODENOSCOPY (EGD) WITH PROPOFOL;  Surgeon: Lin Landsman, MD;  Location: [place];  Service: Endoscopy;  Laterality: N/A;   POLYPECTOMY  12/14/2019   Procedure: POLYPECTOMY;  Surgeon: Lucilla Lame, MD;  Location: [facility];  Service: Endoscopy;;    Social History   Tobacco Use   Smoking status: Former    Packs/day: 0.50    Years: 18.00    Pack years: 9.00    Types: Cigarettes   Smokeless tobacco: Never  Vaping Use   Vaping Use: Never used  Substance Use Topics   Alcohol use: No    Alcohol/week: 0.0 standard drinks   Drug use: No     Medication list has been reviewed and updated.  Current Meds  Medication Sig   aspirin 81 MG tablet Take 1 tablet (81 mg total) by mouth daily. AM   Calcium Carbonate-Vit D-Min (CALCIUM 1200 PO) Take 1 tablet by mouth daily.    cholecalciferol (VITAMIN D3) 25 MCG (1000 UNIT) tablet Take 1,000 Units by mouth daily.   ezetimibe (ZETIA) 10 MG tablet TAKE 1 TABLET BY MOUTH EVERY DAY   folic acid ([employer] FOLIC ACID) 097 MCG tablet TAKE 1 TABLET DAILY. PT NEEDS TO TAKE 1 WHOLE PILL   Glycopyrrolate-Formoterol (BEVESPI AEROSPHERE) 9-4.8 MCG/ACT AERO Inhale into the lungs daily. Dr. Loni Muse   hydrochlorothiazide (HYDRODIURIL) 25 MG tablet TAKE 1 TABLET BY MOUTH EVERY DAY   ipratropium-albuterol (DUONEB) 0.5-2.5 (3) MG/3ML SOLN Inhale 3 mLs into the lungs 3 (three) times daily as needed. Dr A   losartan (COZAAR) 100 MG tablet Take 1 tablet (100 mg total) by mouth daily.   metoprolol succinate (TOPROL-XL) 50 MG 24 hr tablet Take 1 tablet (50 mg total) by mouth daily. TAKE WITH OR IMMEDIATELY FOLLOWING A MEAL.   Multiple Vitamins-Minerals (MULTIVITAMIN WITH MINERALS) tablet Take 1 tablet by mouth daily.   [DISCONTINUED] omeprazole (PRILOSEC) 40 MG capsule Take 40 mg by mouth daily.    PHQ 2/9 Scores 09/18/2020 02/11/2020 09/19/2019 09/12/2019  PHQ - 2 Score 0 0 0 0  PHQ- 9 Score 0 0 0 -    GAD 7 : Generalized Anxiety Score 09/18/2020 02/11/2020 09/19/2019 08/20/2019  Nervous, Anxious, on Edge 0 0 0 0  Control/stop worrying 0 0 0 0  Worry too much - different things 0 0 0 0  Trouble relaxing 0 0 0 0  Restless 0 0 0 0  Easily annoyed or irritable 0 0 0 0  Afraid - awful might happen 0 0 0 0  Total GAD 7 Score 0 0 0 0    BP Readings from Last 3 Encounters:  09/18/20 128/80  02/11/20 128/70  12/14/19 (!) 120/58    Physical Exam Vitals and nursing note reviewed.  Constitutional:      General: She is not in acute distress.    Appearance: She is not diaphoretic.  HENT:     Head: Normocephalic and atraumatic.     Right Ear: Tympanic membrane, ear canal and external ear normal. There is no impacted cerumen.     Left Ear: Tympanic membrane, ear canal and external ear normal. There is no impacted cerumen.     Nose: Nose normal. No  congestion or rhinorrhea.     Mouth/Throat:     Mouth: Mucous membranes are moist.     Pharynx: No oropharyngeal exudate or posterior oropharyngeal erythema.  Eyes:     General: No scleral icterus.       Right eye: No discharge.        Left eye: No discharge.     Conjunctiva/sclera: Conjunctivae normal.     Pupils: Pupils are equal, round, and reactive to light.  Neck:     Thyroid: No thyromegaly.     Vascular: No JVD.  Cardiovascular:     Rate and Rhythm: Normal rate and regular rhythm.     Heart sounds: Normal heart sounds. No murmur heard.   No friction rub. No gallop.  Pulmonary:     Effort: Pulmonary effort is normal.     Breath sounds: Normal breath sounds. No wheezing, rhonchi or rales.  Abdominal:     General: Bowel sounds are normal.     Palpations: Abdomen is soft. There is no mass.     Tenderness: There is no abdominal tenderness. There is no guarding or rebound.  Musculoskeletal:        General: Normal range of motion.     Cervical back: Normal range of motion and neck supple.  Lymphadenopathy:     Cervical: No cervical adenopathy.  Skin:    General: Skin is warm and dry.  Neurological:     Mental Status: She is alert.     Deep Tendon Reflexes: Reflexes are normal and symmetric.    Wt Readings from Last 3 Encounters:  09/18/20 172 lb (78 kg)  02/11/20 163 lb (73.9 kg)  12/14/19 158 lb (71.7 kg)    BP 128/80   Pulse 78   Ht 5\' 8"  (1.727 m)   Wt 172 lb (78 kg)   BMI 26.15 kg/m   Assessment and Plan:  1. Essential hypertension Chronic.  Controlled.  Stable.  Continue losartan 100 mg once a day, metoprolol XL 50 mg once a day, and hydrochlorothiazide 25 mg once a day.  Will check renal function panel for electrolytes and renal function. - losartan (COZAAR) 100 MG tablet; Take 1 tablet (100 mg total) by mouth daily.  Dispense: 90 tablet; Refill: 1 - metoprolol succinate (TOPROL-XL) 50 MG 24 hr tablet; Take 1 tablet (50 mg total) by mouth daily. TAKE WITH  OR IMMEDIATELY FOLLOWING A MEAL.  Dispense: 90 tablet; Refill: 1 - Renal Function Panel - hydrochlorothiazide (HYDRODIURIL) 25 MG tablet; Take 1 tablet (25 mg total) by mouth daily.  Dispense: 90 tablet; Refill: 1  2. Anemia due to folic acid deficiency, unspecified deficiency type Chronic.  Controlled.  Stable.  Continue with folic acid  800 mcg daily.  Will check CBC for current status of anemia. - folic acid ([employer]S FOLIC ACID) 164 MCG tablet; TAKE 1 TABLET DAILY. PT NEEDS TO TAKE 1 WHOLE PILL  Dispense: 90 tablet; Refill: 1 - CBC w/Diff/Platelet  3. Hyperlipidemia, unspecified hyperlipidemia type Chronic.  Controlled.  Stable.  Papers patient is unable to take the Zetia on a daily basis due to cost and is having to extend it.  At this point in time I am just glad that she will take some lipid-lowering agent and we have reemphasized diet as well. - Lipid Panel With LDL/HDL Ratio  4. Statin intolerance Patient is unable to tolerate statins and we will continue with the Zetia as tolerated. - Lipid Panel With LDL/HDL Ratio  5. Esophageal dysphagia New onset.  2018 patient was evaluated with endoscopy by Dr. Marius Ditch for dysphagia.  Patient is now having more dysphagia particularly with chicken and bread.  She is able to get it to move with liquids and positioning.  We have discussed that this may be a worsening of her Schatzki's ring and that at least speaking with gastroenterology with the possibility of having either a imaging study or repeat endoscopy may be in order.  Karie Mainland has been made for Dr. Marius Ditch gastroenterology. - Ambulatory referral to Gastroenterology

## 2020-09-18 NOTE — Patient Instructions (Signed)
Bradley's Neurology in Clinical Practice (8th ed., pp. 152-163). [place], [place]: [employer]."> [employer] Textbook of Surgery (21st ed., pp. 1056-1078). [place], [place]: [employer]>  Dysphagia  Dysphagia is trouble swallowing. This condition occurs when solids and liquids stick in a person's throat on the way down to the stomach, or when food takeslonger to get to the stomach than usual. You may have problems swallowing food, liquids, or both. You may also have pain while trying to swallow. It may take you more time and effort to swallowsomething. What are the causes? This condition may be caused by: Muscle problems. These may make it difficult for you to move food and liquids through the esophagus, which is the tube that connects your mouth to your stomach. Blockages. You may have ulcers, scar tissue, or inflammation that blocks the normal passage of food and liquids. Causes of these problems include: Acid reflux from your stomach into your esophagus (gastroesophageal reflux). Infections. Radiation treatment for cancer. Medicines taken without enough fluids to wash them down into your stomach. Stroke. This can affect the nerves and make it difficult to swallow. Nerve problems. These prevent signals from being sent to the muscles of your esophagus to squeeze (contract) and move what you swallow down to your stomach. Globus pharyngeus. This is a common problem that involves a feeling like something is stuck in your throat or a sense of trouble with swallowing, even though nothing is wrong with the swallowing passages. Certain conditions, such as cerebral palsy or Parkinson's disease. What are the signs or symptoms? Common symptoms of this condition include: A feeling that solids or liquids are stuck in your throat on the way down to the stomach. Pain while swallowing. Coughing or gagging while trying to swallow. Other symptoms include: Food moving back from your stomach to your mouth  (regurgitation). Noises coming from your throat. Chest discomfort when swallowing. A feeling of fullness when swallowing. Drooling, especially when the throat is blocked. Heartburn. How is this diagnosed? This condition may be diagnosed by: Barium swallow X-ray. In this test, you will swallow a white liquid that sticks to the inside of your esophagus. X-ray images are then taken. Endoscopy. In this test, a flexible telescope is inserted down your throat to look at your esophagus and your stomach. CT scans or an MRI. How is this treated? Treatment for dysphagia depends on the cause of this condition: If the dysphagia is caused by acid reflux or infection, medicines may be used. These may include antibiotics or heartburn medicines. If the dysphagia is caused by problems with the muscles, swallowing therapy may be used to help you strengthen your swallowing muscles. You may have to do specific exercises to strengthen the muscles or stretch them. If the dysphagia is caused by a blockage or mass, procedures to remove the blockage may be done. You may need surgery and a feeding tube. You may need to make diet changes. Ask your health care provider for specificinstructions. Follow these instructions at home: Medicines Take over-the-counter and prescription medicines only as told by your health care provider. If you were prescribed an antibiotic medicine, take it as told by your health care provider. Do not stop taking the antibiotic even if you start to feel better. Eating and drinking  Make any diet changes as told by your health care provider. Work with a diet and nutrition specialist (dietitian) to create an eating plan that will help you get the nutrients you need in order to stay healthy. Eat soft foods that   are easier to swallow. Cut your food into small pieces and eat slowly. Take small bites. Eat and drink only when you are sitting upright. Do not drink alcohol or caffeine. If you need  help quitting, ask your health care provider.  General instructions Check your weight every day to make sure you are not losing weight. Do not use any products that contain nicotine or tobacco. These products include cigarettes, chewing tobacco, and vaping devices, such as e-cigarettes. If you need help quitting, ask your health care provider. Keep all follow-up visits. This is important. Contact a health care provider if: You lose weight because you cannot swallow. You cough when you drink liquids. You cough up partially digested food. Get help right away if: You cannot swallow your saliva. You have shortness of breath, a fever, or both. Your voice is hoarse and you have trouble swallowing. These symptoms may represent a serious problem that is an emergency. Do not wait to see if the symptoms will go away. Get medical help right away. Call your local emergency services (911 in the [place] Do not drive yourself to the hospital. Summary Dysphagia is trouble swallowing. This condition occurs when solids and liquids stick in a person's throat on the way down to the stomach. You may cough or gag while trying to swallow. Dysphagia has many possible causes. Treatment for dysphagia depends on the cause of the condition. Keep all follow-up visits. This is important. This information is not intended to replace advice given to you by your health care provider. Make sure you discuss any questions you have with your healthcare provider. Document Revised: 10/13/2019 Document Reviewed: 10/13/2019 Elsevier Patient Education  2022 [employer]  

## 2020-09-19 LAB — LIPID PANEL WITH LDL/HDL RATIO
Cholesterol, Total: 220 mg/dL — ABNORMAL HIGH (ref 100–199)
HDL: 40 mg/dL (ref >39)
LDL Chol Calc (NIH): 142 mg/dL — ABNORMAL HIGH (ref 0–99)
LDL/HDL Ratio: 3.6 ratio — ABNORMAL HIGH (ref 0.0–3.2)
Triglycerides: 209 mg/dL — ABNORMAL HIGH (ref 0–149)
VLDL Cholesterol Cal: 38 mg/dL (ref 5–40)

## 2020-09-19 LAB — CBC WITH DIFFERENTIAL/PLATELET
Basophils Absolute: 0.1 10*3/uL (ref 0.0–0.2)
Basos: 1 %
EOS (ABSOLUTE): 0.2 10*3/uL (ref 0.0–0.4)
Eos: 4 %
Hematocrit: 44.5 % (ref 34.0–46.6)
Hemoglobin: 14.9 g/dL (ref 11.1–15.9)
Immature Grans (Abs): 0 10*3/uL (ref 0.0–0.1)
Immature Granulocytes: 0 %
Lymphocytes Absolute: 2.2 10*3/uL (ref 0.7–3.1)
Lymphs: 42 %
MCH: 30.6 pg (ref 26.6–33.0)
MCHC: 33.5 g/dL (ref 31.5–35.7)
MCV: 91 fL (ref 79–97)
Monocytes Absolute: 0.4 10*3/uL (ref 0.1–0.9)
Monocytes: 8 %
Neutrophils Absolute: 2.3 10*3/uL (ref 1.4–7.0)
Neutrophils: 45 %
Platelets: 378 10*3/uL (ref 150–450)
RBC: 4.87 x10E6/uL (ref 3.77–5.28)
RDW: 13.3 % (ref 11.7–15.4)
WBC: 5.1 10*3/uL (ref 3.4–10.8)

## 2020-09-19 LAB — RENAL FUNCTION PANEL
Albumin: 4.6 g/dL (ref 3.7–4.7)
BUN/Creatinine Ratio: 20 (ref 12–28)
BUN: 25 mg/dL (ref 8–27)
CO2: 27 mmol/L (ref 20–29)
Calcium: 10.5 mg/dL — ABNORMAL HIGH (ref 8.7–10.3)
Chloride: 98 mmol/L (ref 96–106)
Creatinine, Ser: 1.27 mg/dL — ABNORMAL HIGH (ref 0.57–1.00)
Glucose: 90 mg/dL (ref 65–99)
Phosphorus: 3.6 mg/dL (ref 3.0–4.3)
Potassium: 4.3 mmol/L (ref 3.5–5.2)
Sodium: 140 mmol/L (ref 134–144)
eGFR: 45 mL/min/{1.73_m2} — ABNORMAL LOW (ref >59)

## 2020-10-01 ENCOUNTER — Ambulatory Visit (INDEPENDENT_AMBULATORY_CARE_PROVIDER_SITE_OTHER): Payer: Medicare HMO

## 2020-10-01 DIAGNOSIS — Z1231 Encounter for screening mammogram for malignant neoplasm of breast: Secondary | ICD-10-CM

## 2020-10-01 DIAGNOSIS — M4302 Spondylolysis, cervical region: Secondary | ICD-10-CM | POA: Insufficient documentation

## 2020-10-01 DIAGNOSIS — Z780 Asymptomatic menopausal state: Secondary | ICD-10-CM | POA: Diagnosis not present

## 2020-10-01 DIAGNOSIS — Z0000 Encounter for general adult medical examination without abnormal findings: Secondary | ICD-10-CM

## 2020-10-01 NOTE — Patient Instructions (Signed)
Taylor Griffin , Thank you for taking time to come for your Medicare Wellness Visit. I appreciate your ongoing commitment to your health goals. Please review the following plan we discussed and let me know if I can assist you in the future.   Screening recommendations/referrals: Colonoscopy: done 12/14/19; repeat in 12/2026 Mammogram: done 10/15/19. Please call [phone] to schedule your mammogram and bone density screening.  Bone Density: done 10/10/18 Recommended yearly ophthalmology/optometry visit for glaucoma screening and checkup Recommended yearly dental visit for hygiene and checkup  Vaccinations: Influenza vaccine: done 01/01/20 Pneumococcal vaccine: done 07/11/17 Tdap vaccine: done 05/03/16 Shingles vaccine: Shingrix discussed. Please contact your pharmacy for coverage information.  Covid-19:04/03/19, 05/01/19 & 02/12/20  Conditions/risks identified: Recommend increasing physical activity to at least 3 days per week   Next appointment: Follow up in one year for your annual wellness visit    Preventive Care 65 Years and Older, Female Preventive care refers to lifestyle choices and visits with your health care provider that can promote health and wellness. What does preventive care include? A yearly physical exam. This is also called an annual well check. Dental exams once or twice a year. Routine eye exams. Ask your health care provider how often you should have your eyes checked. Personal lifestyle choices, including: Daily care of your teeth and gums. Regular physical activity. Eating a healthy diet. Avoiding tobacco and drug use. Limiting alcohol use. Practicing safe sex. Taking low-dose aspirin every day. Taking vitamin and mineral supplements as recommended by your health care provider. What happens during an annual well check? The services and screenings done by your health care provider during your annual well check will depend on your age, overall health, lifestyle risk  factors, and family history of disease. Counseling  Your health care provider may ask you questions about your: Alcohol use. Tobacco use. Drug use. Emotional well-being. Home and relationship well-being. Sexual activity. Eating habits. History of falls. Memory and ability to understand (cognition). Work and work Statistician. Reproductive health. Screening  You may have the following tests or measurements: Height, weight, and BMI. Blood pressure. Lipid and cholesterol levels. These may be checked every 5 years, or more frequently if you are over 22 years old. Skin check. Lung cancer screening. You may have this screening every year starting at age 43 if you have a 30-pack-year history of smoking and currently smoke or have quit within the past 15 years. Fecal occult blood test (FOBT) of the stool. You may have this test every year starting at age 41. Flexible sigmoidoscopy or colonoscopy. You may have a sigmoidoscopy every 5 years or a colonoscopy every 10 years starting at age 31. Hepatitis C blood test. Hepatitis B blood test. Sexually transmitted disease (STD) testing. Diabetes screening. This is done by checking your blood sugar (glucose) after you have not eaten for a while (fasting). You may have this done every 1-3 years. Bone density scan. This is done to screen for osteoporosis. You may have this done starting at age 51. Mammogram. This may be done every 1-2 years. Talk to your health care provider about how often you should have regular mammograms. Talk with your health care provider about your test results, treatment options, and if necessary, the need for more tests. Vaccines  Your health care provider may recommend certain vaccines, such as: Influenza vaccine. This is recommended every year. Tetanus, diphtheria, and acellular pertussis (Tdap, Td) vaccine. You may need a Td booster every 10 years. Zoster vaccine. You may need this  after age 43. Pneumococcal 13-valent  conjugate (PCV13) vaccine. One dose is recommended after age 81. Pneumococcal polysaccharide (PPSV23) vaccine. One dose is recommended after age 48. Talk to your health care provider about which screenings and vaccines you need and how often you need them. This information is not intended to replace advice given to you by your health care provider. Make sure you discuss any questions you have with your health care provider. Document Released: 03/21/2015 Document Revised: 11/12/2015 Document Reviewed: 12/24/2014 Elsevier Interactive Patient Education  2017 [employer] Prevention in the Home Falls can cause injuries. They can happen to people of all ages. There are many things you can do to make your home safe and to help prevent falls. What can I do on the outside of my home? Regularly fix the edges of walkways and driveways and fix any cracks. Remove anything that might make you trip as you walk through a door, such as a raised step or threshold. Trim any bushes or trees on the path to your home. Use bright outdoor lighting. Clear any walking paths of anything that might make someone trip, such as rocks or tools. Regularly check to see if handrails are loose or broken. Make sure that both sides of any steps have handrails. Any raised decks and porches should have guardrails on the edges. Have any leaves, snow, or ice cleared regularly. Use sand or salt on walking paths during winter. Clean up any spills in your garage right away. This includes oil or grease spills. What can I do in the bathroom? Use night lights. Install grab bars by the toilet and in the tub and shower. Do not use towel bars as grab bars. Use non-skid mats or decals in the tub or shower. If you need to sit down in the shower, use a plastic, non-slip stool. Keep the floor dry. Clean up any water that spills on the floor as soon as it happens. Remove soap buildup in the tub or shower regularly. Attach bath mats  securely with double-sided non-slip rug tape. Do not have throw rugs and other things on the floor that can make you trip. What can I do in the bedroom? Use night lights. Make sure that you have a light by your bed that is easy to reach. Do not use any sheets or blankets that are too big for your bed. They should not hang down onto the floor. Have a firm chair that has side arms. You can use this for support while you get dressed. Do not have throw rugs and other things on the floor that can make you trip. What can I do in the kitchen? Clean up any spills right away. Avoid walking on wet floors. Keep items that you use a lot in easy-to-reach places. If you need to reach something above you, use a strong step stool that has a grab bar. Keep electrical cords out of the way. Do not use floor polish or wax that makes floors slippery. If you must use wax, use non-skid floor wax. Do not have throw rugs and other things on the floor that can make you trip. What can I do with my stairs? Do not leave any items on the stairs. Make sure that there are handrails on both sides of the stairs and use them. Fix handrails that are broken or loose. Make sure that handrails are as long as the stairways. Check any carpeting to make sure that it is firmly attached to the  stairs. Fix any carpet that is loose or worn. Avoid having throw rugs at the top or bottom of the stairs. If you do have throw rugs, attach them to the floor with carpet tape. Make sure that you have a light switch at the top of the stairs and the bottom of the stairs. If you do not have them, ask someone to add them for you. What else can I do to help prevent falls? Wear shoes that: Do not have high heels. Have rubber bottoms. Are comfortable and fit you well. Are closed at the toe. Do not wear sandals. If you use a stepladder: Make sure that it is fully opened. Do not climb a closed stepladder. Make sure that both sides of the stepladder  are locked into place. Ask someone to hold it for you, if possible. Clearly mark and make sure that you can see: Any grab bars or handrails. First and last steps. Where the edge of each step is. Use tools that help you move around (mobility aids) if they are needed. These include: Canes. Walkers. Scooters. Crutches. Turn on the lights when you go into a dark area. Replace any light bulbs as soon as they burn out. Set up your furniture so you have a clear path. Avoid moving your furniture around. If any of your floors are uneven, fix them. If there are any pets around you, be aware of where they are. Review your medicines with your doctor. Some medicines can make you feel dizzy. This can increase your chance of falling. Ask your doctor what other things that you can do to help prevent falls. This information is not intended to replace advice given to you by your health care provider. Make sure you discuss any questions you have with your health care provider. Document Released: 12/19/2008 Document Revised: 07/31/2015 Document Reviewed: 03/29/2014 Elsevier Interactive Patient Education  2017 [employer].

## 2020-10-01 NOTE — Progress Notes (Signed)
Subjective:   Taylor Griffin is a 70 y.o. female who presents for Medicare Annual (Subsequent) preventive examination.  Virtual Visit via Telephone Note  I connected with  Taylor Griffin on 10/01/20 at 10:00 AM EDT by telephone and verified that I am speaking with the correct person using two identifiers.  Location: Patient: home Provider: [facility] Persons participating in the virtual visit: [place]   I discussed the limitations, risks, security and privacy concerns of performing an evaluation and management service by telephone and the availability of in person appointments. The patient expressed understanding and agreed to proceed.  Interactive audio and video telecommunications were attempted between this nurse and patient, however failed, due to patient having technical difficulties OR patient did not have access to video capability.  We continued and completed visit with audio only.  Some vital signs may be absent or patient reported.   [name], LPN   Review of Systems     Cardiac Risk Factors include: advanced age (>88mn, >>31women);dyslipidemia;hypertension     Objective:    There were no vitals filed for this visit. There is no height or weight on file to calculate BMI.  Advanced Directives 10/01/2020 12/14/2019 09/12/2019 09/06/2018 01/26/2017 11/22/2014 10/29/2014  Does Patient Have a Medical Advance Directive? Yes Yes Yes Yes Yes Yes Yes  Type of AParamedicof AGlen AllenLiving will HFairfield BeachLiving will HParnellLiving will HHato CandalLiving will HGreat Bendwill  Does patient want to make changes to medical advance directive? - No - Patient declined - - No - Patient declined - -  Copy of HBaringin Chart? Yes - validated most recent copy scanned in chart (See row information) No - copy requested Yes - validated most recent  copy scanned in chart (See row information) No - copy requested No - copy requested - No - copy requested    Current Medications (verified) Outpatient Encounter Medications as of 10/01/2020  Medication Sig   aspirin 81 MG tablet Take 1 tablet (81 mg total) by mouth daily. AM   Calcium Carbonate-Vit D-Min (CALCIUM 1200 PO) Take 1 tablet by mouth daily.   cholecalciferol (VITAMIN D3) 25 MCG (1000 UNIT) tablet Take 1,000 Units by mouth daily.   ezetimibe (ZETIA) 10 MG tablet TAKE 1 TABLET BY MOUTH EVERY DAY   folic acid ([facility] FOLIC ACID) 8Q000111QMCG tablet TAKE 1 TABLET DAILY. PT NEEDS TO TAKE 1 WHOLE PILL   Glycopyrrolate-Formoterol (BEVESPI AEROSPHERE) 9-4.8 MCG/ACT AERO Inhale into the lungs daily. Dr. ALoni Muse  hydrochlorothiazide (HYDRODIURIL) 25 MG tablet Take 1 tablet (25 mg total) by mouth daily.   losartan (COZAAR) 100 MG tablet Take 1 tablet (100 mg total) by mouth daily.   metoprolol succinate (TOPROL-XL) 50 MG 24 hr tablet Take 1 tablet (50 mg total) by mouth daily. TAKE WITH OR IMMEDIATELY FOLLOWING A MEAL.   Multiple Vitamins-Minerals (MULTIVITAMIN WITH MINERALS) tablet Take 1 tablet by mouth daily.   fluticasone (FLONASE) 50 MCG/ACT nasal spray Place 2 sprays into the nose daily. Dr A (Patient not taking: No sig reported)   ipratropium-albuterol (DUONEB) 0.5-2.5 (3) MG/3ML SOLN Inhale 3 mLs into the lungs 3 (three) times daily as needed. Dr A   No facility-administered encounter medications on file as of 10/01/2020.    Allergies (verified) Latex and Pravastatin sodium   History: Past Medical History:  Diagnosis Date   Anemia    Anemia  due to folic acid deficiency [id number]   Arthritis    WRIST AND FINGERS   Benign neoplasm of ascending colon    Benign neoplasm of descending colon    Benign neoplasm of sigmoid colon    COPD (chronic obstructive pulmonary disease) ([place])    Esophageal dysphagia    Gastroesophageal reflux disease 07/11/2017   Heart burn    Hyperlipidemia     Hypertension    CONTROLLED WITH MEDS   Past Surgical History:  Procedure Laterality Date   COLONOSCOPY WITH PROPOFOL N/A 11/22/2014   Procedure: COLONOSCOPY WITH PROPOFOL;  Surgeon: Lucilla Lame, MD;  Location: [place];  Service: Endoscopy;  Laterality: N/A;  LATEX ALLERGY   COLONOSCOPY WITH PROPOFOL N/A 12/14/2019   Procedure: COLONOSCOPY WITH PROPOFOL;  Surgeon: Lucilla Lame, MD;  Location: [place];  Service: Endoscopy;  Laterality: N/A;  priority 4   ECTOPIC PREGNANCY SURGERY     ESOPHAGOGASTRODUODENOSCOPY (EGD) WITH PROPOFOL N/A 01/26/2017   Procedure: ESOPHAGOGASTRODUODENOSCOPY (EGD) WITH PROPOFOL;  Surgeon: Lin Landsman, MD;  Location: [place];  Service: Endoscopy;  Laterality: N/A;   POLYPECTOMY  12/14/2019   Procedure: POLYPECTOMY;  Surgeon: Lucilla Lame, MD;  Location: [place];  Service: Endoscopy;;   Family History  Problem Relation Age of Onset   Diabetes Mother    Hypertension Mother    Breast cancer Sister 5   Breast cancer Cousin        2 mat cousins   Social History   Socioeconomic History   Marital status: Married    Spouse name: Not on file   Number of children: 2   Years of education: Not on file   Highest education level: Associate degree: academic program  Occupational History   Not on file  Tobacco Use   Smoking status: Former    Packs/day: 0.50    Years: 18.00    Pack years: 9.00    Types: Cigarettes   Smokeless tobacco: Never  Vaping Use   Vaping Use: Never used  Substance and Sexual Activity   Alcohol use: No    Alcohol/week: 0.0 standard drinks   Drug use: No   Sexual activity: Never  Other Topics Concern   Not on file  Social History Narrative   Not on file   Social Determinants of Health   Financial Resource Strain: Low Risk    Difficulty of Paying Living Expenses: Not very hard  Food Insecurity: No Food Insecurity   Worried About Charity fundraiser in the Last Year: Never true    Ran Out of Food in the Last Year: Never true  Transportation Needs: No Transportation Needs   Lack of Transportation (Medical): No   Lack of Transportation (Non-Medical): No  Physical Activity: Inactive   Days of Exercise per Week: 0 days   Minutes of Exercise per Session: 0 min  Stress: No Stress Concern Present   Feeling of Stress : Not at all  Social Connections: Moderately Integrated   Frequency of Communication with Friends and Family: More than three times a week   Frequency of Social Gatherings with Friends and Family: More than three times a week   Attends Religious Services: More than 4 times per year   Active Member of [employer] or Organizations: No   Attends Archivist Meetings: Never   Marital Status: Married    Tobacco Counseling Counseling given: Not Answered   Clinical Intake:  Pre-visit preparation completed: Yes  Pain : No/denies pain  Nutritional Risks: None Diabetes: No  How often do you need to have someone help you when you read instructions, pamphlets, or other written materials from your doctor or pharmacy?: 1 - Never    Interpreter Needed?: No  Information entered by :: [name] LPN   Activities of Daily Living In your present state of health, do you have any difficulty performing the following activities: 10/01/2020 09/18/2020  Hearing? N N  Vision? N N  Difficulty concentrating or making decisions? N N  Walking or climbing stairs? N N  Dressing or bathing? N N  Doing errands, shopping? N N  Preparing Food and eating ? N -  Using the Toilet? N -  In the past six months, have you accidently leaked urine? N -  Do you have problems with loss of bowel control? N -  Managing your Medications? N -  Managing your Finances? N -  Housekeeping or managing your Housekeeping? N -  Some recent data might be hidden    Patient Care Team: Juline Patch, MD as PCP - General (Family Medicine)  Indicate any recent Medical Services you  may have received from other than [facility] providers in the past year (date may be approximate).     Assessment:   This is a routine wellness examination for [place].  Hearing/Vision screen Hearing Screening - Comments:: Pt denies hearing difficulty  Vision Screening - Comments:: Annual vision screening done by Dr. Camillo Flaming in [place]  Dietary issues and exercise activities discussed: Current Exercise Habits: The patient has a physically strenuous job, but has no regular exercise apart from work., Exercise limited by: respiratory conditions(s)   Goals Addressed   None    Depression Screen PHQ 2/9 Scores 10/01/2020 09/18/2020 02/11/2020 09/19/2019 09/12/2019 08/20/2019 02/19/2019  PHQ - 2 Score 0 0 0 0 0 0 0  PHQ- 9 Score - 0 0 0 - 0 0    Fall Risk Fall Risk  10/01/2020 09/18/2020 02/11/2020 09/19/2019 09/12/2019  Falls in the past year? 0 0 0 0 0  Number falls in past yr: 0 0 - - 0  Injury with Fall? 0 0 - - 0  Risk for fall due to : No Fall Risks No Fall Risks - - No Fall Risks  Follow up Falls prevention discussed Falls evaluation completed Falls evaluation completed Falls evaluation completed Falls prevention discussed    FALL RISK PREVENTION PERTAINING TO THE HOME:  Any stairs in or around the home? Yes  If so, are there any without handrails? Yes  - 2 steps outside Home free of loose throw rugs in walkways, pet beds, electrical cords, etc? Yes  Adequate lighting in your home to reduce risk of falls? Yes   ASSISTIVE DEVICES UTILIZED TO PREVENT FALLS:  Life alert? No  Use of a cane, walker or w/c? No  Grab bars in the bathroom? Yes  Shower chair or bench in shower? No  Elevated toilet seat or a handicapped toilet? Yes   TIMED UP AND GO:  Was the test performed? No . Telephonic visit.   Cognitive Function: Normal cognitive status assessed by direct observation by this Nurse Health Advisor. No abnormalities found.          Immunizations Immunization History   Administered Date(s) Administered   Fluad Quad(high Dose 65+) 12/25/2018, 01/01/2020   Influenza, High Dose Seasonal PF 11/01/2016, 01/11/2018   Moderna SARS-COV2 Booster Vaccination 02/12/2020   Moderna Sars-Covid-2 Vaccination 04/03/2019, 05/01/2019   Pneumococcal Conjugate-13 05/03/2016  Pneumococcal Polysaccharide-23 07/11/2017   Tdap 05/03/2016    TDAP status: Up to date  Flu Vaccine status: Up to date  Pneumococcal vaccine status: Up to date  Covid-19 vaccine status: Completed vaccines  Qualifies for Shingles Vaccine? Yes   Zostavax completed No   Shingrix Completed?: No.    Education has been provided regarding the importance of this vaccine. Patient has been advised to call insurance company to determine out of pocket expense if they have not yet received this vaccine. Advised may also receive vaccine at local pharmacy or Health Dept. Verbalized acceptance and understanding.  Screening Tests Health Maintenance  Topic Date Due   COVID-19 Vaccine (4 - Booster for Moderna series) 10/04/2020 (Originally 05/12/2020)   Zoster Vaccines- Shingrix (1 of 2) 12/19/2020 (Originally 05/15/1969)   INFLUENZA VACCINE  10/06/2020   MAMMOGRAM  10/14/2020   TETANUS/TDAP  05/03/2026   COLONOSCOPY (Pts 45-77yr Insurance coverage will need to be confirmed)  12/14/2026   DEXA SCAN  Completed   Hepatitis C Screening  Completed   PNA vac Low Risk Adult  Completed   HPV VACCINES  Aged Out    Health Maintenance  There are no preventive care reminders to display for this patient.  Colorectal cancer screening: Type of screening: Colonoscopy. Completed 12/14/19. Repeat every 7 years  Mammogram status: Completed 10/15/19. Repeat every year. Ordered today.   Bone Density status: Completed 10/10/18. Results reflect: Bone density results: OSTEOPENIA. Repeat every 2 years.  Lung Cancer Screening: (Low Dose CT Chest recommended if Age 71-80years, 30 pack-year currently smoking OR have quit w/in  15years.) does not qualify.   Additional Screening:  Hepatitis C Screening: does qualify; Completed 05/03/16  Vision Screening: Recommended annual ophthalmology exams for early detection of glaucoma and other disorders of the eye. Is the patient up to date with their annual eye exam?  Yes  Who is the provider or what is the name of the office in which the patient attends annual eye exams? Dr. KPeter Garter   Dental Screening: Recommended annual dental exams for proper oral hygiene  Community Resource Referral / Chronic Care Management: CRR required this visit?  No   CCM required this visit?  No      Plan:     I have personally reviewed and noted the following in the patient's chart:   Medical and social history Use of alcohol, tobacco or illicit drugs  Current medications and supplements including opioid prescriptions.  Functional ability and status Nutritional status Physical activity Advanced directives List of other physicians Hospitalizations, surgeries, and ER visits in previous 12 months Vitals Screenings to include cognitive, depression, and falls Referrals and appointments  In addition, I have reviewed and discussed with patient certain preventive protocols, quality metrics, and best practice recommendations. A written personalized care plan for preventive services as well as general preventive health recommendations were provided to patient.     KClemetine Marker LPN   [phone]  Nurse Notes: pt states out of pocket cost for 90 day supply on ezetimibe was $173.99. contacted [facility] to confirm billed to insurance and pharmacist stated it was billed to insurance but they may prefer a statin which the patient was unable to tolerate after taking pravastatin for awhile. Patient eligible to use goodrx coupon for cash price and if she transfers rx to [facility] the cost would be only $34.99 for 90 day supply. Pt aware and plans to transfer and will notify office if she needs assistance.

## 2020-10-13 ENCOUNTER — Telehealth: Payer: Self-pay

## 2020-10-13 NOTE — Telephone Encounter (Signed)
Called pt with mammo and bone density appt- 10/29/20 @ 10:20 in [place]

## 2020-10-29 ENCOUNTER — Other Ambulatory Visit: Payer: Self-pay

## 2020-10-29 ENCOUNTER — Ambulatory Visit
Admission: RE | Admit: 2020-10-29 | Discharge: 2020-10-29 | Disposition: A | Discharge status: Home / Self Care | Payer: Medicare HMO | Source: Ambulatory Visit | Attending: Family Medicine | Admitting: Family Medicine

## 2020-10-29 DIAGNOSIS — Z1231 Encounter for screening mammogram for malignant neoplasm of breast: Secondary | ICD-10-CM | POA: Insufficient documentation

## 2020-10-29 DIAGNOSIS — Z780 Asymptomatic menopausal state: Secondary | ICD-10-CM | POA: Insufficient documentation

## 2020-10-29 DIAGNOSIS — M8589 Other specified disorders of bone density and structure, multiple sites: Secondary | ICD-10-CM | POA: Diagnosis not present

## 2020-11-09 ENCOUNTER — Encounter (HOSPITAL_COMMUNITY): Payer: Self-pay | Admitting: *Deleted

## 2020-11-09 ENCOUNTER — Ambulatory Visit (HOSPITAL_COMMUNITY)
Admission: EM | Admit: 2020-11-09 | Discharge: 2020-11-09 | Disposition: A | Discharge status: Other Acute Inpatient Hospital | Payer: Medicare HMO | Attending: Internal Medicine | Admitting: Internal Medicine

## 2020-11-09 DIAGNOSIS — R0789 Other chest pain: Secondary | ICD-10-CM | POA: Diagnosis not present

## 2020-11-09 NOTE — ED Triage Notes (Signed)
Pt reports having Chest tightness since Friday. Pt also reports a strong family Hx of Heart problems.

## 2020-11-09 NOTE — ED Provider Notes (Signed)
[facility]    CSN: [id number] Arrival date & time: 11/09/20  1630      History   Chief Complaint Chief Complaint  Patient presents with   Chest Pain    HPI Taylor Griffin is a 70 y.o. female.   HPI  Chest Pain: Patient reports that starting Friday night when she went to go rest she started to have left-sided chest pressure.  The pressure is located near her breast.  Pressure lasted on and off throughout the night.  She states that resting over on her left side tended to make the symptoms worse and resting on her right side made the symptoms better.  Since then she has had this pain off and on.  Last time this occurred was about an hour ago.  Symptoms lasted for about 20 minutes and resolved on their own with rest.  She denies activity as a worsening factor for symptoms.  No fever, cough, shortness of breath or active chest pain.  She states that she feels back to normal at this time.  No personal history of MI.  She does take in what 81 mg aspirin.  Past Medical History:  Diagnosis Date   Anemia    Anemia due to folic acid deficiency [id number]   Arthritis    WRIST AND FINGERS   Benign neoplasm of ascending colon    Benign neoplasm of descending colon    Benign neoplasm of sigmoid colon    COPD (chronic obstructive pulmonary disease) ([place])    Esophageal dysphagia    Gastroesophageal reflux disease 07/11/2017   Heart burn    Hyperlipidemia    Hypertension    CONTROLLED WITH MEDS    Patient Active Problem List   Diagnosis Date Noted   Spondylolysis of cervical spine 10/01/2020   Centrilobular emphysema ([place]) 08/21/2018   Dyshidrotic eczema 07/11/2017   Hyperlipidemia, unspecified 05/03/2016   Hypertension 05/03/2016    Past Surgical History:  Procedure Laterality Date   COLONOSCOPY WITH PROPOFOL N/A 11/22/2014   Procedure: COLONOSCOPY WITH PROPOFOL;  Surgeon: Lucilla Lame, MD;  Location: [place];  Service: Endoscopy;  Laterality: N/A;  LATEX  ALLERGY   COLONOSCOPY WITH PROPOFOL N/A 12/14/2019   Procedure: COLONOSCOPY WITH PROPOFOL;  Surgeon: Lucilla Lame, MD;  Location: [place];  Service: Endoscopy;  Laterality: N/A;  priority 4   ECTOPIC PREGNANCY SURGERY     ESOPHAGOGASTRODUODENOSCOPY (EGD) WITH PROPOFOL N/A 01/26/2017   Procedure: ESOPHAGOGASTRODUODENOSCOPY (EGD) WITH PROPOFOL;  Surgeon: Lin Landsman, MD;  Location: [place];  Service: Endoscopy;  Laterality: N/A;   POLYPECTOMY  12/14/2019   Procedure: POLYPECTOMY;  Surgeon: Lucilla Lame, MD;  Location: [place];  Service: Endoscopy;;    OB History   No obstetric history on file.      Home Medications    Prior to Admission medications   Medication Sig Start Date End Date Taking? Authorizing Provider  aspirin 81 MG tablet Take 1 tablet (81 mg total) by mouth daily. AM 05/03/16   Juline Patch, MD  Calcium Carbonate-Vit D-Min (CALCIUM 1200 PO) Take 1 tablet by mouth daily.    [provider]  cholecalciferol (VITAMIN D3) 25 MCG (1000 UNIT) tablet Take 1,000 Units by mouth daily.    [provider]  ezetimibe (ZETIA) 10 MG tablet TAKE 1 TABLET BY MOUTH EVERY DAY 08/09/20   Juline Patch, MD  fluticasone (FLONASE) 50 MCG/ACT nasal spray Place 2 sprays into the nose daily. Dr A Patient  not taking: No sig reported 01/16/18 09/18/20  [provider]  folic acid ([employer] FOLIC ACID) [id number] MCG tablet TAKE 1 TABLET DAILY. PT NEEDS TO TAKE 1 WHOLE PILL 09/18/20   Juline Patch, MD  Glycopyrrolate-Formoterol (BEVESPI AEROSPHERE) 9-4.8 MCG/ACT AERO Inhale into the lungs daily. Dr. Loni Muse    [provider]  hydrochlorothiazide (HYDRODIURIL) 25 MG tablet Take 1 tablet (25 mg total) by mouth daily. 09/18/20   Juline Patch, MD  ipratropium-albuterol (DUONEB) 0.5-2.5 (3) MG/3ML SOLN Inhale 3 mLs into the lungs 3 (three) times daily as needed. Dr A 03/20/18 09/18/20  [provider]  losartan (COZAAR) 100 MG tablet  Take 1 tablet (100 mg total) by mouth daily. 09/18/20   Juline Patch, MD  metoprolol succinate (TOPROL-XL) 50 MG 24 hr tablet Take 1 tablet (50 mg total) by mouth daily. TAKE WITH OR IMMEDIATELY FOLLOWING A MEAL. 09/18/20   Juline Patch, MD  Multiple Vitamins-Minerals (MULTIVITAMIN WITH MINERALS) tablet Take 1 tablet by mouth daily.    [provider]    Family History Family History  Problem Relation Age of Onset   Diabetes Mother    Hypertension Mother    Breast cancer Sister 42   Breast cancer Cousin        2 mat cousins    Social History Social History   Tobacco Use   Smoking status: Former    Packs/day: 0.50    Years: 18.00    Pack years: 9.00    Types: Cigarettes   Smokeless tobacco: Never  Vaping Use   Vaping Use: Never used  Substance Use Topics   Alcohol use: No    Alcohol/week: 0.0 standard drinks   Drug use: No     Allergies   Latex and Pravastatin sodium   Review of Systems Review of Systems  As stated above in HPI Physical Exam Triage Vital Signs ED Triage Vitals  Enc Vitals Group     BP 11/09/20 1636 119/76     Pulse Rate 11/09/20 1636 76     Resp 11/09/20 1636 18     Temp 11/09/20 1636 98.3 F (36.8 C)     Temp src --      SpO2 11/09/20 1636 95 %     Weight --      Height --      Head Circumference --      Peak Flow --      Pain Score 11/09/20 1635 0     Pain Loc --      Pain Edu? --      Excl. in [place]? --    No data found.  Updated Vital Signs BP 119/76   Pulse 76   Temp 98.3 F (36.8 C)   Resp 18   SpO2 95%   Physical Exam Vitals and nursing note reviewed.  Constitutional:      General: She is not in acute distress.    Appearance: She is well-developed. She is not ill-appearing, toxic-appearing or diaphoretic.  HENT:     Head: Normocephalic and atraumatic.  Eyes:     Extraocular Movements: Extraocular movements intact.     Pupils: Pupils are equal, round, and reactive to light.  Neck:     Vascular: No  hepatojugular reflux or JVD.     Trachea: No tracheal deviation.  Cardiovascular:     Rate and Rhythm: Normal rate and regular rhythm.     Pulses:  Carotid pulses are 2+ on the right side and 2+ on the left side.      Radial pulses are 2+ on the right side and 2+ on the left side.       Dorsalis pedis pulses are 2+ on the right side and 2+ on the left side.       Posterior tibial pulses are 2+ on the right side and 2+ on the left side.     Heart sounds: Normal heart sounds.  Pulmonary:     Effort: Pulmonary effort is normal.     Breath sounds: Normal breath sounds.  Chest:     Chest wall: No mass, tenderness, crepitus or edema.  Abdominal:     Palpations: Abdomen is soft.  Musculoskeletal:     Cervical back: Neck supple.  Skin:    General: Skin is warm.     Coloration: Skin is not cyanotic or pale.     Findings: No ecchymosis or erythema.     Nails: There is no clubbing.  Neurological:     Mental Status: She is alert and oriented to person, place, and time.     UC Treatments / Results  Labs (all labs ordered are listed, but only abnormal results are displayed) Labs Reviewed - No data to display  EKG   Radiology No results found.  Procedures Procedures (including critical care time)  Medications Ordered in UC Medications - No data to display  Initial Impression / Assessment and Plan / UC Course  I have reviewed the triage vital signs and the nursing notes.  Pertinent labs & imaging results that were available during my care of the patient were reviewed by me and considered in my medical decision making (see chart for details).     New.  Patient appears well.  No significant changes on EKG although there is a slow late depression in lead II but without any reciprocal changes or any leads that seem to be affected as well.  I discussed with patient the need for troponin levels to be drawn to rule out MI.  At this time she states that she is feeling 100% back  to normal and wishes to watch and wait.  We will continue her 81 mg aspirin.  We discussed red flag signs and symptoms.  She will need to follow-up with cardiology this week if she does not go to the emergency room for further work-up. Final Clinical Impressions(s) / UC Diagnoses   Final diagnoses:  None   Discharge Instructions   None    ED Prescriptions   None    PDMP not reviewed this encounter.   Hughie Closs, [place] 11/09/20 1709

## 2020-11-12 ENCOUNTER — Encounter: Payer: Self-pay | Admitting: Family Medicine

## 2020-11-12 ENCOUNTER — Other Ambulatory Visit: Payer: Self-pay

## 2020-11-12 ENCOUNTER — Ambulatory Visit (INDEPENDENT_AMBULATORY_CARE_PROVIDER_SITE_OTHER): Payer: Medicare HMO | Admitting: Family Medicine

## 2020-11-12 VITALS — BP 108/62 | HR 66 | Ht 67.0 in | Wt 160.0 lb

## 2020-11-12 DIAGNOSIS — R079 Chest pain, unspecified: Secondary | ICD-10-CM | POA: Diagnosis not present

## 2020-11-12 MED ORDER — NITROGLYCERIN 0.4 MG SL SUBL: 0.4000 mg | SUBLINGUAL_TABLET | SUBLINGUAL | 3 refills | Status: DC | PRN

## 2020-11-12 NOTE — Progress Notes (Signed)
Date:  11/12/2020   Name:  Taylor Griffin   DOB:  [birth year]   MRN:  [id number]   Chief Complaint: Follow-up (UC follow up due to chest pain )  Chest Pain  This is a new problem. The current episode started in the past 7 days. The onset quality is gradual. The problem occurs intermittently. The problem has been unchanged. The pain is present in the substernal region (left of substernal). The pain is at a severity of 4/10. The pain is moderate. The quality of the pain is described as pressure. The pain radiates to the left neck and right neck. Pertinent negatives include no abdominal pain, back pain, cough, dizziness, exertional chest pressure, fever, headaches, nausea, palpitations or shortness of breath. The treatment provided mild relief.   Lab Results  Component Value Date   CREATININE 1.27 (H) 09/18/2020   BUN 25 09/18/2020   NA 140 09/18/2020   K 4.3 09/18/2020   CL 98 09/18/2020   CO2 27 09/18/2020   Lab Results  Component Value Date   CHOL 220 (H) 09/18/2020   HDL 40 09/18/2020   LDLCALC 142 (H) 09/18/2020   TRIG 209 (H) 09/18/2020   CHOLHDL 5.2 (H) 07/11/2017   No results found for: TSH No results found for: HGBA1C Lab Results  Component Value Date   WBC 5.1 09/18/2020   HGB 14.9 09/18/2020   HCT 44.5 09/18/2020   MCV 91 09/18/2020   PLT 378 09/18/2020   Lab Results  Component Value Date   ALT 23 02/11/2020   AST 24 02/11/2020   ALKPHOS 79 02/11/2020   BILITOT 0.9 02/11/2020     Review of Systems  Constitutional:  Negative for chills and fever.  HENT:  Negative for drooling, ear discharge, ear pain and sore throat.   Respiratory:  Negative for cough, shortness of breath and wheezing.   Cardiovascular:  Positive for chest pain. Negative for palpitations and leg swelling.  Gastrointestinal:  Negative for abdominal pain, blood in stool, constipation, diarrhea and nausea.  Endocrine: Negative for polydipsia.  Genitourinary:  Negative for dysuria,  frequency, hematuria and urgency.  Musculoskeletal:  Negative for back pain, myalgias and neck pain.  Skin:  Negative for rash.  Allergic/Immunologic: Negative for environmental allergies.  Neurological:  Negative for dizziness and headaches.  Hematological:  Does not bruise/bleed easily.  Psychiatric/Behavioral:  Negative for suicidal ideas. The patient is not nervous/anxious.    Patient Active Problem List   Diagnosis Date Noted   Spondylolysis of cervical spine 10/01/2020   Centrilobular emphysema ([place]) 08/21/2018   Dyshidrotic eczema 07/11/2017   Hyperlipidemia, unspecified 05/03/2016   Hypertension 05/03/2016    Allergies  Allergen Reactions   Latex Itching   Pravastatin Sodium     Past Surgical History:  Procedure Laterality Date   COLONOSCOPY WITH PROPOFOL N/A 11/22/2014   Procedure: COLONOSCOPY WITH PROPOFOL;  Surgeon: Lucilla Lame, MD;  Location: [place];  Service: Endoscopy;  Laterality: N/A;  LATEX ALLERGY   COLONOSCOPY WITH PROPOFOL N/A 12/14/2019   Procedure: COLONOSCOPY WITH PROPOFOL;  Surgeon: Lucilla Lame, MD;  Location: [place];  Service: Endoscopy;  Laterality: N/A;  priority 4   ECTOPIC PREGNANCY SURGERY     ESOPHAGOGASTRODUODENOSCOPY (EGD) WITH PROPOFOL N/A 01/26/2017   Procedure: ESOPHAGOGASTRODUODENOSCOPY (EGD) WITH PROPOFOL;  Surgeon: Lin Landsman, MD;  Location: [place];  Service: Endoscopy;  Laterality: N/A;   POLYPECTOMY  12/14/2019   Procedure: POLYPECTOMY;  Surgeon: Lucilla Lame, MD;  Location:  [place];  Service: Endoscopy;;    Social History   Tobacco Use   Smoking status: Former    Packs/day: 0.50    Years: 18.00    Pack years: 9.00    Types: Cigarettes   Smokeless tobacco: Never  Vaping Use   Vaping Use: Never used  Substance Use Topics   Alcohol use: No    Alcohol/week: 0.0 standard drinks   Drug use: No     Medication list has been reviewed and updated.  No outpatient  medications have been marked as taking for the 11/12/20 encounter (Office Visit) with Juline Patch, MD.    [facility] 2/9 Scores 10/01/2020 09/18/2020 02/11/2020 09/19/2019  PHQ - 2 Score 0 0 0 0  PHQ- 9 Score - 0 0 0    GAD 7 : Generalized Anxiety Score 09/18/2020 02/11/2020 09/19/2019 08/20/2019  Nervous, Anxious, on Edge 0 0 0 0  Control/stop worrying 0 0 0 0  Worry too much - different things 0 0 0 0  Trouble relaxing 0 0 0 0  Restless 0 0 0 0  Easily annoyed or irritable 0 0 0 0  Afraid - awful might happen 0 0 0 0  Total GAD 7 Score 0 0 0 0    BP Readings from Last 3 Encounters:  11/09/20 119/76  09/18/20 128/80  02/11/20 128/70    Physical Exam Vitals and nursing note reviewed.  Constitutional:      Appearance: She is well-developed.  HENT:     Head: Normocephalic.     Right Ear: External ear normal.     Left Ear: External ear normal.  Eyes:     General: Lids are everted, no foreign bodies appreciated. No scleral icterus.       Left eye: No foreign body or hordeolum.     Conjunctiva/sclera: Conjunctivae normal.     Right eye: Right conjunctiva is not injected.     Left eye: Left conjunctiva is not injected.     Pupils: Pupils are equal, round, and reactive to light.  Neck:     Thyroid: No thyromegaly.     Vascular: No JVD.     Trachea: No tracheal deviation.  Cardiovascular:     Rate and Rhythm: Normal rate and regular rhythm.     Heart sounds: Normal heart sounds. No murmur heard.   No friction rub. No gallop.  Pulmonary:     Effort: Pulmonary effort is normal. No respiratory distress.     Breath sounds: Normal breath sounds. No wheezing or rales.  Abdominal:     General: Bowel sounds are normal.     Palpations: Abdomen is soft. There is no mass.     Tenderness: There is no abdominal tenderness. There is no guarding or rebound.  Musculoskeletal:        General: No tenderness. Normal range of motion.     Cervical back: Normal range of motion and neck supple.   Lymphadenopathy:     Cervical: No cervical adenopathy.  Skin:    General: Skin is warm.     Findings: No rash.  Neurological:     Mental Status: She is alert and oriented to person, place, and time.     Cranial Nerves: No cranial nerve deficit.     Deep Tendon Reflexes: Reflexes normal.  Psychiatric:        Mood and Affect: Mood is not anxious or depressed.    Wt Readings from Last 3 Encounters:  09/18/20 172 lb (  78 kg)  02/11/20 163 lb (73.9 kg)  12/14/19 158 lb (71.7 kg)    Ht '5\' 7"'$  (1.702 m)   BMI 26.94 kg/m   Assessment and Plan:  1. Chest pain, unspecified type New onset.  Episodic.  Described as pressure radiating to both sides of the neck.  Not associated with shortness of breath palpitations or dizziness.  Patient has multiple risk factors including age hypertension and history of smoking.  Will refer to cardiology for possible further evaluation.  Patient was also given nitroglycerin 0.4 sublingual as needed. - Ambulatory referral to Cardiology - nitroGLYCERIN (NITROSTAT) 0.4 MG SL tablet; Place 1 tablet (0.4 mg total) under the tongue every 5 (five) minutes as needed for chest pain.  Dispense: 50 tablet; Refill: 3

## 2020-12-05 ENCOUNTER — Encounter: Payer: Self-pay | Admitting: Gastroenterology

## 2020-12-05 ENCOUNTER — Other Ambulatory Visit: Payer: Self-pay

## 2020-12-05 ENCOUNTER — Ambulatory Visit: Payer: Medicare HMO | Admitting: Gastroenterology

## 2020-12-05 VITALS — BP 144/81 | HR 79 | Temp 97.5°F | Ht 67.0 in | Wt 162.8 lb

## 2020-12-05 DIAGNOSIS — K222 Esophageal obstruction: Secondary | ICD-10-CM

## 2020-12-05 DIAGNOSIS — R1319 Other dysphagia: Secondary | ICD-10-CM

## 2020-12-05 MED ORDER — OMEPRAZOLE 40 MG PO CPDR: 40.0000 mg | DELAYED_RELEASE_CAPSULE | Freq: Every day | ORAL | 0 refills | Status: DC

## 2020-12-05 NOTE — Progress Notes (Signed)
Cephas Darby, MD [address]  [place], [place] [address]  Main: [phone]  Fax: [phone]    Gastroenterology Consultation  Referring Provider:     Juline Patch, MD Primary Care Physician:  Juline Patch, MD Primary Gastroenterologist:  Dr. Cephas Darby Reason for Consultation:     Dysphagia        HPI:   Taylor Griffin is a 70 y.o. y/o female referred by Dr. Juline Patch, MD  for consultation & management of dysphagia. Chronic dysphagia, "sensation of something stuck" since 2011. X-ray UGI at that time was unremarkable. Symptoms are frequent in last few weeks, most of days in a week, only with solids, often preceded by hiccups, did not lose weight, but a/w retrosternal burning pain and regurgiattion. Notices mostly with fried chicken. Denies food impaction. Bloating with milk products. Denies episodes of food impaction Did not try antacids so far Ex-smoker, quit 1 yr ago, 30+ yrs No GI surgeries in the past  Follow-up visit 03/13/2018 Patient is here for follow-up after 1 year.  Patient underwent EGD with esophageal biopsies for dysphagia and it was unremarkable.  She continued omeprazole 40 mg daily with resolution of her symptoms.  Recently, her PCP changed it to omeprazole 20 mg daily and plan to have her follow-up with GI.  Patient denies heartburn or regurgitation.  She reports having, low-grade, mild abdominal discomfort, below the umbilicus for the last 1 year.  She denies any particular relation to food.  There is no radiation of pain and no other associated symptoms.  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  She reports having similar pain in 2018 for which she underwent ultrasound abdomen which was unremarkable.  Patient gained about 10 pounds within last few months, she states she likes eating cheesecake.  Follow-up visit 12/05/2020 Taylor Griffin is here for follow-up of dysphagia.   She reports that her symptoms of dysphagia have returned.  She has difficulty swallowing with hard foods like chicken even when she chews it well.  She feels like food getting stuck followed by burning in her throat.  She weaned off omeprazole based on my recommendation during her last visit.  She is currently not on any acid suppressive medication.  She does acknowledge drinking sweet tea 4 gallons a week.  She does not drink water.  I informed her about mild rise in her creatinine.  GI Procedures:  Colonoscopy 12/14/2019 - One 3 mm polyp in the ascending colon, removed with a cold snare. Resected and retrieved. - One 2 mm polyp in the transverse colon, removed with a cold snare. Resected and retrieved. - Diverticulosis in the entire examined colon. - Non-bleeding internal hemorrhoids.  DIAGNOSIS:  A.  COLON POLYP, ASCENDING; COLD SNARE:  - TUBULAR ADENOMA.  - NEGATIVE FOR HIGH-GRADE DYSPLASIA AND MALIGNANCY.   B.  COLON POLYP, TRANSVERSE; COLD SNARE:  - TUBULAR ADENOMA.  - NEGATIVE FOR HIGH-GRADE DYSPLASIA AND MALIGNANCY.   Colonoscopy 2016 by Dr Allen Norris, found to have polyps. Due in 2021  EGD 01/26/17 - Normal duodenal bulb and second portion of the duodenum. - Normal stomach. - Esophagogastric landmarks identified. - Widely patent and non-obstructing Schatzki ring. - Normal esophagus. Biopsied.  Denies fam h/o GI malignancy  Past Medical History:  Diagnosis Date   Anemia    Anemia due to folic acid deficiency 6/96/2952   Arthritis    WRIST AND  FINGERS   Benign neoplasm of ascending colon    Benign neoplasm of descending colon    Benign neoplasm of sigmoid colon    COPD (chronic obstructive pulmonary disease) (HCC)    Esophageal dysphagia    Gastroesophageal reflux disease 07/11/2017   Heart burn    Hyperlipidemia    Hypertension    CONTROLLED WITH MEDS    Past Surgical History:  Procedure Laterality Date   COLONOSCOPY WITH PROPOFOL N/A 11/22/2014   Procedure:  COLONOSCOPY WITH PROPOFOL;  Surgeon: Lucilla Lame, MD;  Location: [place];  Service: Endoscopy;  Laterality: N/A;  LATEX ALLERGY   COLONOSCOPY WITH PROPOFOL N/A 12/14/2019   Procedure: COLONOSCOPY WITH PROPOFOL;  Surgeon: Lucilla Lame, MD;  Location: [place];  Service: Endoscopy;  Laterality: N/A;  priority 4   ECTOPIC PREGNANCY SURGERY     ESOPHAGOGASTRODUODENOSCOPY (EGD) WITH PROPOFOL N/A 01/26/2017   Procedure: ESOPHAGOGASTRODUODENOSCOPY (EGD) WITH PROPOFOL;  Surgeon: Lin Landsman, MD;  Location: [place];  Service: Endoscopy;  Laterality: N/A;   POLYPECTOMY  12/14/2019   Procedure: POLYPECTOMY;  Surgeon: Lucilla Lame, MD;  [facility];  Service: Endoscopy;;    Current Outpatient Medications:    aspirin 81 MG tablet, Take 1 tablet (81 mg total) by mouth daily. AM, Disp: 30 tablet, Rfl: 11   Calcium Carbonate-Vit D-Min (CALCIUM 1200 PO), Take 1 tablet by mouth daily., Disp: , Rfl:    cholecalciferol (VITAMIN D3) 25 MCG (1000 UNIT) tablet, Take 1,000 Units by mouth daily., Disp: , Rfl:    ezetimibe (ZETIA) 10 MG tablet, TAKE 1 TABLET BY MOUTH EVERY DAY, Disp: 90 tablet, Rfl: 1   folic acid ([employer] FOLIC ACID) 867 MCG tablet, TAKE 1 TABLET DAILY. PT NEEDS TO TAKE 1 WHOLE PILL, Disp: 90 tablet, Rfl: 1   Glycopyrrolate-Formoterol (BEVESPI AEROSPHERE) 9-4.8 MCG/ACT AERO, Inhale into the lungs daily. Dr. Loni Muse, Disp: , Rfl:    hydrochlorothiazide (HYDRODIURIL) 25 MG tablet, Take 1 tablet (25 mg total) by mouth daily., Disp: 90 tablet, Rfl: 1   losartan (COZAAR) 100 MG tablet, Take 1 tablet (100 mg total) by mouth daily., Disp: 90 tablet, Rfl: 1   metoprolol succinate (TOPROL-XL) 50 MG 24 hr tablet, Take 1 tablet (50 mg total) by mouth daily. TAKE WITH OR IMMEDIATELY FOLLOWING A MEAL., Disp: 90 tablet, Rfl: 1   Multiple Vitamins-Minerals (MULTIVITAMIN WITH MINERALS) tablet, Take 1 tablet by mouth daily., Disp: , Rfl:    nitroGLYCERIN (NITROSTAT) 0.4  MG SL tablet, Place 1 tablet (0.4 mg total) under the tongue every 5 (five) minutes as needed for chest pain., Disp: 50 tablet, Rfl: 3   omeprazole (PRILOSEC) 40 MG capsule, Take 1 capsule (40 mg total) by mouth daily., Disp: 30 capsule, Rfl: 0   fluticasone (FLONASE) 50 MCG/ACT nasal spray, Place 2 sprays into the nose daily. Dr A, Disp: , Rfl:    ipratropium-albuterol (DUONEB) 0.5-2.5 (3) MG/3ML SOLN, Inhale 3 mLs into the lungs 3 (three) times daily as needed. Dr A, Disp: , Rfl:   Family History  Problem Relation Age of Onset   Diabetes Mother    Hypertension Mother    Breast cancer Sister 48   Breast cancer Cousin        2 mat cousins     Social History   Tobacco Use   Smoking status: Former    Packs/day: 0.50    Years: 18.00    Pack years: 9.00    Types: Cigarettes   Smokeless tobacco:  Never  Vaping Use   Vaping Use: Never used  Substance Use Topics   Alcohol use: No    Alcohol/week: 0.0 standard drinks   Drug use: No    Allergies as of 12/05/2020 - Review Complete 12/05/2020  Allergen Reaction Noted   Latex Itching 11/14/2014   Pravastatin sodium  10/01/2020    Review of Systems:    All systems reviewed and negative except where noted in HPI.   Physical Exam:  BP (!) 144/81 (BP Location: Right Arm, Patient Position: Sitting, Cuff Size: Large)   Pulse 79   Temp (!) 97.5 F (36.4 C) (Temporal)   Ht 5\' 7"  (1.702 m)   Wt 162 lb 12.8 oz (73.8 kg)   BMI 25.50 kg/m  No LMP recorded. Patient is postmenopausal.  General:   Alert,  Well-developed, well-nourished, pleasant and cooperative in NAD Head:  Normocephalic and atraumatic. Eyes:  Sclera clear, no icterus.   Conjunctiva pink. Ears:  Normal auditory acuity. Nose:  No deformity, discharge, or lesions. Mouth:  No deformity or lesions,oropharynx pink & moist. Neck:  Supple; no masses or thyromegaly. Lungs:  Respirations even and unlabored.  Clear throughout to auscultation.   No wheezes, crackles, or rhonchi.  No acute distress. Heart:  Regular rate and rhythm; no murmurs, clicks, rubs, or gallops. Abdomen:  Normal bowel sounds.  No bruits.  Soft, non-tender and non-distended without masses, hepatosplenomegaly or hernias noted.  No guarding or rebound tenderness.   Rectal: Nor performed Msk:  Symmetrical without gross deformities. Good, equal movement & strength bilaterally. Pulses:  Normal pulses noted. Extremities:  No clubbing or edema.  No cyanosis. Neurologic:  Alert and oriented x3;  grossly normal neurologically. Skin:  Intact without significant lesions or rashes. No jaundice. Psych:  Alert and cooperative. Normal mood and affect.  Imaging Studies: X ray UGI 2011 RESULT:     Barium swallow upper GI examination is performed with a double  contrast technique. The patient easily ingested the liquid barium. The  esophageal mucosa and morphology appear normal. The stomach distends  fully.  The gastric mucosa is unremarkable. Barium flows easily into the small  bowel. There is gastroesophageal reflux demonstrated into the mid to  proximal esophagus with and without reflux maneuvers. No esophageal  mucosal  deformity or esophageal stenosis was evident. Good esophageal peristalsis  was present with a prone swallow with some limited proximal escape  demonstrated.   Assessment and Plan:   Taylor Griffin is a 69 y.o. female, ex- smoker, no major medical problems, chronic intermittent dysphagia to solids only, heart burn with no weight loss, anemia.  EGD in 2018 was unremarkable including esophageal biopsies, there was presence of widely patent Schatzki's ring.  Symptoms of dysphagia have recurred off PPI use.  With presence of Schatzki's ring, recommend repeat EGD to assess for dilation of the ring Start omeprazole 40 mg daily before breakfast for 1 month Strongly advised her to significantly cut back on sweet tea and start drinking water  History of colon polyps, due for surveillance  colonoscopy in 2028  Follow up based on EGD findings   Cephas Darby, MD

## 2020-12-08 DIAGNOSIS — Z122 Encounter for screening for malignant neoplasm of respiratory organs: Secondary | ICD-10-CM | POA: Diagnosis not present

## 2020-12-08 DIAGNOSIS — K219 Gastro-esophageal reflux disease without esophagitis: Secondary | ICD-10-CM | POA: Diagnosis not present

## 2020-12-08 DIAGNOSIS — I10 Essential (primary) hypertension: Secondary | ICD-10-CM | POA: Diagnosis not present

## 2020-12-08 DIAGNOSIS — J449 Chronic obstructive pulmonary disease, unspecified: Secondary | ICD-10-CM | POA: Diagnosis not present

## 2020-12-08 DIAGNOSIS — R079 Chest pain, unspecified: Secondary | ICD-10-CM | POA: Diagnosis not present

## 2020-12-08 DIAGNOSIS — E785 Hyperlipidemia, unspecified: Secondary | ICD-10-CM | POA: Diagnosis not present

## 2020-12-08 DIAGNOSIS — R0602 Shortness of breath: Secondary | ICD-10-CM | POA: Diagnosis not present

## 2020-12-08 DIAGNOSIS — I208 Other forms of angina pectoris: Secondary | ICD-10-CM | POA: Diagnosis not present

## 2020-12-08 DIAGNOSIS — R69 Illness, unspecified: Secondary | ICD-10-CM | POA: Diagnosis not present

## 2020-12-10 ENCOUNTER — Other Ambulatory Visit: Payer: Self-pay | Admitting: Internal Medicine

## 2020-12-10 DIAGNOSIS — Z122 Encounter for screening for malignant neoplasm of respiratory organs: Secondary | ICD-10-CM

## 2020-12-10 DIAGNOSIS — R0602 Shortness of breath: Secondary | ICD-10-CM

## 2020-12-16 ENCOUNTER — Other Ambulatory Visit: Payer: Self-pay

## 2020-12-16 ENCOUNTER — Ambulatory Visit
Admission: RE | Admit: 2020-12-16 | Discharge: 2020-12-16 | Disposition: A | Discharge status: Home / Self Care | Payer: Medicare HMO | Source: Ambulatory Visit | Attending: Internal Medicine | Admitting: Internal Medicine

## 2020-12-16 DIAGNOSIS — J439 Emphysema, unspecified: Secondary | ICD-10-CM | POA: Diagnosis not present

## 2020-12-16 DIAGNOSIS — I700 Atherosclerosis of aorta: Secondary | ICD-10-CM | POA: Diagnosis not present

## 2020-12-16 DIAGNOSIS — R0602 Shortness of breath: Secondary | ICD-10-CM | POA: Diagnosis not present

## 2020-12-16 DIAGNOSIS — Z122 Encounter for screening for malignant neoplasm of respiratory organs: Secondary | ICD-10-CM | POA: Diagnosis not present

## 2020-12-16 DIAGNOSIS — Z87891 Personal history of nicotine dependence: Secondary | ICD-10-CM | POA: Insufficient documentation

## 2020-12-30 DIAGNOSIS — I208 Other forms of angina pectoris: Secondary | ICD-10-CM | POA: Diagnosis not present

## 2021-01-08 ENCOUNTER — Encounter: Payer: Self-pay | Admitting: Gastroenterology

## 2021-01-12 DIAGNOSIS — Z01818 Encounter for other preprocedural examination: Secondary | ICD-10-CM | POA: Diagnosis not present

## 2021-01-12 DIAGNOSIS — J449 Chronic obstructive pulmonary disease, unspecified: Secondary | ICD-10-CM | POA: Diagnosis not present

## 2021-01-14 DIAGNOSIS — Z122 Encounter for screening for malignant neoplasm of respiratory organs: Secondary | ICD-10-CM | POA: Diagnosis not present

## 2021-01-14 DIAGNOSIS — K219 Gastro-esophageal reflux disease without esophagitis: Secondary | ICD-10-CM | POA: Diagnosis not present

## 2021-01-14 DIAGNOSIS — I10 Essential (primary) hypertension: Secondary | ICD-10-CM | POA: Diagnosis not present

## 2021-01-14 DIAGNOSIS — E785 Hyperlipidemia, unspecified: Secondary | ICD-10-CM | POA: Diagnosis not present

## 2021-01-14 DIAGNOSIS — J449 Chronic obstructive pulmonary disease, unspecified: Secondary | ICD-10-CM | POA: Diagnosis not present

## 2021-01-14 DIAGNOSIS — R0602 Shortness of breath: Secondary | ICD-10-CM | POA: Diagnosis not present

## 2021-01-14 DIAGNOSIS — R079 Chest pain, unspecified: Secondary | ICD-10-CM | POA: Diagnosis not present

## 2021-01-14 DIAGNOSIS — I208 Other forms of angina pectoris: Secondary | ICD-10-CM | POA: Diagnosis not present

## 2021-01-14 DIAGNOSIS — R69 Illness, unspecified: Secondary | ICD-10-CM | POA: Diagnosis not present

## 2021-01-22 ENCOUNTER — Ambulatory Visit: Payer: Medicare HMO | Admitting: Anesthesiology

## 2021-01-22 ENCOUNTER — Ambulatory Visit
Admission: RE | Admit: 2021-01-22 | Discharge: 2021-01-22 | Disposition: A | Discharge status: Home / Self Care | Payer: Medicare HMO | Attending: Gastroenterology | Admitting: Gastroenterology

## 2021-01-22 ENCOUNTER — Other Ambulatory Visit: Payer: Self-pay

## 2021-01-22 ENCOUNTER — Encounter: Payer: Self-pay | Admitting: Gastroenterology

## 2021-01-22 ENCOUNTER — Encounter
Admission: RE | Disposition: A | Discharge status: Home / Self Care | Payer: Self-pay | Source: Home / Self Care | Attending: Gastroenterology

## 2021-01-22 DIAGNOSIS — J449 Chronic obstructive pulmonary disease, unspecified: Secondary | ICD-10-CM | POA: Diagnosis not present

## 2021-01-22 DIAGNOSIS — E039 Hypothyroidism, unspecified: Secondary | ICD-10-CM | POA: Insufficient documentation

## 2021-01-22 DIAGNOSIS — E785 Hyperlipidemia, unspecified: Secondary | ICD-10-CM | POA: Diagnosis not present

## 2021-01-22 DIAGNOSIS — I10 Essential (primary) hypertension: Secondary | ICD-10-CM | POA: Diagnosis not present

## 2021-01-22 DIAGNOSIS — R1314 Dysphagia, pharyngoesophageal phase: Secondary | ICD-10-CM | POA: Diagnosis present

## 2021-01-22 DIAGNOSIS — K219 Gastro-esophageal reflux disease without esophagitis: Secondary | ICD-10-CM | POA: Diagnosis not present

## 2021-01-22 DIAGNOSIS — R1319 Other dysphagia: Secondary | ICD-10-CM

## 2021-01-22 DIAGNOSIS — Z87891 Personal history of nicotine dependence: Secondary | ICD-10-CM | POA: Diagnosis not present

## 2021-01-22 HISTORY — PX: ESOPHAGOGASTRODUODENOSCOPY (EGD) WITH PROPOFOL: SHX5813

## 2021-01-22 SURGERY — ESOPHAGOGASTRODUODENOSCOPY (EGD) WITH PROPOFOL
Anesthesia: General | Site: Throat

## 2021-01-22 MED ORDER — ACETAMINOPHEN 10 MG/ML IV SOLN: 1000.0000 mg | Freq: Once | INTRAVENOUS | Status: DC | PRN

## 2021-01-22 MED ORDER — SODIUM CHLORIDE 0.9 % IV SOLN: INTRAVENOUS | Status: DC

## 2021-01-22 MED ORDER — LACTATED RINGERS IV SOLN: INTRAVENOUS | Status: DC

## 2021-01-22 MED ORDER — ALBUTEROL SULFATE (2.5 MG/3ML) 0.083% IN NEBU
2.5000 mg | INHALATION_SOLUTION | Freq: Once | RESPIRATORY_TRACT | Status: AC
  Administered 2021-01-22: 09:00:00 2.5 mg via RESPIRATORY_TRACT

## 2021-01-22 MED ORDER — PROPOFOL 10 MG/ML IV BOLUS
INTRAVENOUS | Status: DC | PRN
  Administered 2021-01-22: 20 mg via INTRAVENOUS
  Administered 2021-01-22: 40 mg via INTRAVENOUS
  Administered 2021-01-22: 150 mg via INTRAVENOUS

## 2021-01-22 MED ORDER — GLYCOPYRROLATE 0.2 MG/ML IJ SOLN
INTRAMUSCULAR | Status: DC | PRN
  Administered 2021-01-22: .1 mg via INTRAVENOUS

## 2021-01-22 MED ORDER — ONDANSETRON HCL 4 MG/2ML IJ SOLN: 4.0000 mg | Freq: Once | INTRAMUSCULAR | Status: DC | PRN

## 2021-01-22 MED ORDER — STERILE WATER FOR IRRIGATION IR SOLN
Status: DC | PRN
  Administered 2021-01-22: 250 mL

## 2021-01-22 SURGICAL SUPPLY — 7 items
BLOCK BITE 60FR ADLT L/F GRN (MISCELLANEOUS) ×2 IMPLANT
GOWN CVR UNV OPN BCK APRN NK (MISCELLANEOUS) ×2 IMPLANT
GOWN ISOL THUMB LOOP REG UNIV (MISCELLANEOUS) ×4
KIT PRC NS LF DISP ENDO (KITS) ×1 IMPLANT
KIT PROCEDURE OLYMPUS (KITS) ×2
MANIFOLD NEPTUNE II (INSTRUMENTS) ×2 IMPLANT
WATER STERILE IRR 250ML POUR (IV SOLUTION) ×2 IMPLANT

## 2021-01-22 NOTE — Transfer of Care (Signed)
Immediate Anesthesia Transfer of Care Note  Patient: Taylor Griffin  Procedure(s) Performed: ESOPHAGOGASTRODUODENOSCOPY (EGD) WITH PROPOFOL (Throat)  Patient Location: PACU  Anesthesia Type: General  Level of Consciousness: awake, alert  and patient cooperative  Airway and Oxygen Therapy: Patient Spontanous Breathing and Patient connected to supplemental oxygen  Post-op Assessment: Post-op Vital signs reviewed, Patient's Cardiovascular Status Stable, Respiratory Function Stable, Patent Airway and No signs of Nausea or vomiting  Post-op Vital Signs: Reviewed and stable  Complications: No notable events documented.

## 2021-01-22 NOTE — Anesthesia Preprocedure Evaluation (Signed)
Anesthesia Evaluation  Patient identified by MRN, date of birth, ID band Patient awake    Reviewed: Allergy & Precautions, NPO status , Patient's Chart, lab work & pertinent test results, reviewed documented beta blocker date and time   History of Anesthesia Complications Negative for: history of anesthetic complications  Airway Mallampati: II  TM Distance: >3 FB Neck ROM: Full    Dental   Pulmonary COPD, Current SmokerPatient did not abstain from smoking., former smoker,    breath sounds clear to auscultation       Cardiovascular hypertension, Pt. on medications and Pt. on home beta blockers (-) angina(-) DOE + dysrhythmias  Rhythm:Regular Rate:Normal   HLD   Neuro/Psych Anxiety    GI/Hepatic GERD  , Dysphagia   Endo/Other  Hypothyroidism   Renal/GU      Musculoskeletal  (+) Arthritis ,   Abdominal   Peds  Hematology  (+) Blood dyscrasia, anemia ,   Anesthesia Other Findings   Reproductive/Obstetrics                             Anesthesia Physical Anesthesia Plan  ASA: 2  Anesthesia Plan: General   Post-op Pain Management:    Induction: Intravenous  PONV Risk Score and Plan: 3 and Propofol infusion, TIVA and Treatment may vary due to age or medical condition  Airway Management Planned: Natural Airway and Nasal Cannula  Additional Equipment:   Intra-op Plan:   Post-operative Plan:   Informed Consent: I have reviewed the patients History and Physical, chart, labs and discussed the procedure including the risks, benefits and alternatives for the proposed anesthesia with the patient or authorized representative who has indicated his/her understanding and acceptance.       Plan Discussed with: CRNA and Anesthesiologist  Anesthesia Plan Comments:         Anesthesia Quick Evaluation

## 2021-01-22 NOTE — Anesthesia Procedure Notes (Signed)
Date/Time: 01/22/2021 10:01 AM Performed by: Cameron Ali, CRNA Pre-anesthesia Checklist: Patient identified, Emergency Drugs available, Suction available, Timeout performed and Patient being monitored Patient Re-evaluated:Patient Re-evaluated prior to induction Oxygen Delivery Method: Nasal cannula Placement Confirmation: positive ETCO2

## 2021-01-22 NOTE — Op Note (Signed)
[facility] Gastroenterology Patient Name: Taylor Griffin Procedure Date: 01/22/2021 9:49 AM MRN: [id number] Account #: [id number] Date of Birth: Oct 28, 1950 Admit Type: Outpatient Age: 70 Room: [facility] OR ROOM 01 Gender: Female Note Status: Finalized Instrument Name: [id number] Procedure:             Upper GI endoscopy Indications:           Esophageal dysphagia Providers:             Lin Landsman MD, MD Referring MD:          Juline Patch, MD (Referring MD) Medicines:             General Anesthesia Complications:         No immediate complications. Estimated blood loss: None. Procedure:             Pre-Anesthesia Assessment:                        - Prior to the procedure, a History and Physical was                         performed, and patient medications and allergies were                         reviewed. The patient is competent. The risks and                         benefits of the procedure and the sedation options and                         risks were discussed with the patient. All questions                         were answered and informed consent was obtained.                         Patient identification and proposed procedure were                         verified by the physician, the nurse, the                         anesthesiologist, the anesthetist and the technician                         in the pre-procedure area in the procedure room in the                         endoscopy suite. Mental Status Examination: alert and                         oriented. Airway Examination: normal oropharyngeal                         airway and neck mobility. Respiratory Examination:                         clear to auscultation. CV Examination: normal.  Prophylactic Antibiotics: The patient does not require                         prophylactic antibiotics. Prior Anticoagulants: The                         patient has taken no  previous anticoagulant or                         antiplatelet agents. ASA Grade Assessment: II - A                         patient with mild systemic disease. After reviewing                         the risks and benefits, the patient was deemed in                         satisfactory condition to undergo the procedure. The                         anesthesia plan was to use general anesthesia.                         Immediately prior to administration of medications,                         the patient was re-assessed for adequacy to receive                         sedatives. The heart rate, respiratory rate, oxygen                         saturations, blood pressure, adequacy of pulmonary                         ventilation, and response to care were monitored                         throughout the procedure. The physical status of the                         patient was re-assessed after the procedure.                        After obtaining informed consent, the endoscope was                         passed under direct vision. Throughout the procedure,                         the patient's blood pressure, pulse, and oxygen                         saturations were monitored continuously. The Endoscope                         was introduced through the mouth, and advanced to the  second part of duodenum. The upper GI endoscopy was                         accomplished without difficulty. The patient tolerated                         the procedure well. Findings:      The esophagus, gastroesophageal junction and examined esophagus were       normal.      The stomach was normal.      The examined duodenum was normal.      Esophagogastric landmarks were identified: the gastroesophageal junction       was found at 40 cm from the incisors.      The cardia and gastric fundus were normal on retroflexion. Impression:            - Normal esophagus and gastroesophageal  junction.                        - Normal stomach.                        - Normal examined duodenum.                        - Esophagogastric landmarks identified.                        - No specimens collected. Recommendation:        - Discharge patient to home (with escort).                        - Resume previous diet today.                        - Continue present medications.                        - Follow an antireflux regimen indefinitely.                        - Use Prilosec (omeprazole) 40 mg PO daily for 3                         months. Procedure Code(s):     --- Professional ---                        [phone], Esophagogastroduodenoscopy, flexible,                         transoral; diagnostic, including collection of                         specimen(s) by brushing or washing, when performed                         (separate procedure) Diagnosis Code(s):     --- Professional ---                        R13.14, Dysphagia, pharyngoesophageal phase CPT copyright 2019 [employer]. All rights reserved. The codes documented in this  report are preliminary and upon coder review may  be revised to meet current compliance requirements. Dr. Ulyess Mort Lin Landsman MD, MD 01/22/2021 10:10:52 AM This report has been signed electronically. Number of Addenda: 0 Note Initiated On: 01/22/2021 9:49 AM Total Procedure Duration: 0 hours 2 minutes 5 seconds  Estimated Blood Loss:  Estimated blood loss was minimal. Estimated blood                         loss: none. Estimated blood loss: none.      [facility]

## 2021-01-22 NOTE — H&P (Signed)
Cephas Darby, MD [address]  [place]  [place], [place] [address]  Main: [phone]  Fax: [phone] Pager: [phone]  Primary Care Physician:  Juline Patch, MD Primary Gastroenterologist:  Dr. Cephas Darby  Pre-Procedure History & Physical: HPI:  Taylor Griffin is a 70 y.o. female is here for an endoscopy.   Past Medical History:  Diagnosis Date   Anemia    Anemia due to folic acid deficiency 1/94/1740   Arthritis    WRIST AND FINGERS   Benign neoplasm of ascending colon    Benign neoplasm of descending colon    Benign neoplasm of sigmoid colon    COPD (chronic obstructive pulmonary disease) ([place])    Esophageal dysphagia    Gastroesophageal reflux disease 07/11/2017   Heart burn    Hyperlipidemia    Hypertension    CONTROLLED WITH MEDS    Past Surgical History:  Procedure Laterality Date   COLONOSCOPY WITH PROPOFOL N/A 11/22/2014   Procedure: COLONOSCOPY WITH PROPOFOL;  Surgeon: Lucilla Lame, MD;  Location: [facility];  Service: Endoscopy;  Laterality: N/A;  LATEX ALLERGY   COLONOSCOPY WITH PROPOFOL N/A 12/14/2019   Procedure: COLONOSCOPY WITH PROPOFOL;  Surgeon: Lucilla Lame, MD;  Location: [facility];  Service: Endoscopy;  Laterality: N/A;  priority 4   ECTOPIC PREGNANCY SURGERY     ESOPHAGOGASTRODUODENOSCOPY (EGD) WITH PROPOFOL N/A 01/26/2017   Procedure: ESOPHAGOGASTRODUODENOSCOPY (EGD) WITH PROPOFOL;  Surgeon: Lin Landsman, MD;  Location: [facility];  Service: Endoscopy;  Laterality: N/A;   POLYPECTOMY  12/14/2019   Procedure: POLYPECTOMY;  Surgeon: Lucilla Lame, MD;  Location: [facility];  Service: Endoscopy;;    Prior to Admission medications   Medication Sig Start Date End Date Taking? Authorizing Provider  aspirin 81 MG tablet Take 1 tablet (81 mg total) by mouth daily. AM 05/03/16  Yes Juline Patch, MD  Calcium Carbonate-Vit D-Min (CALCIUM 1200 PO) Take 1 tablet by mouth daily.   Yes [provider]  cholecalciferol (VITAMIN D3) 25 MCG (1000 UNIT) tablet Take 1,000 Units by mouth daily.   Yes [provider]  ezetimibe (ZETIA) 10 MG tablet TAKE 1 TABLET BY MOUTH EVERY DAY 08/09/20  Yes Juline Patch, MD  folic acid ([employer] FOLIC ACID) 814 MCG tablet TAKE 1 TABLET DAILY. PT NEEDS TO TAKE 1 WHOLE PILL 09/18/20  Yes Juline Patch, MD  Glycopyrrolate-Formoterol (BEVESPI AEROSPHERE) 9-4.8 MCG/ACT AERO Inhale into the lungs daily. Dr. Gae Dry [provider]  hydrochlorothiazide (HYDRODIURIL) 25 MG tablet Take 1 tablet (25 mg total) by mouth daily. 09/18/20  Yes Juline Patch, MD  ipratropium-albuterol (DUONEB) 0.5-2.5 (3) MG/3ML SOLN Inhale 3 mLs into the lungs 3 (three) times daily as needed. Dr A 03/20/18 01/22/21 Yes [provider]  losartan (COZAAR) 100 MG tablet Take 1 tablet (100 mg total) by mouth daily. 09/18/20  Yes Juline Patch, MD  metoprolol succinate (TOPROL-XL) 50 MG 24 hr tablet Take 1 tablet (50 mg total) by mouth daily. TAKE WITH OR IMMEDIATELY FOLLOWING A MEAL. 09/18/20  Yes Juline Patch, MD  Multiple Vitamins-Minerals (MULTIVITAMIN WITH MINERALS) tablet Take 1 tablet by mouth daily.   Yes [provider]  omeprazole (PRILOSEC) 40 MG capsule Take 1 capsule (40 mg total) by mouth daily. 12/05/20  Yes Cartel Mauss, Tally Due, MD  fluticasone (FLONASE) 50 MCG/ACT nasal spray Place 2 sprays into the nose daily. Dr A 01/16/18 11/12/20  [provider]  nitroGLYCERIN (NITROSTAT) 0.4 MG SL tablet Place 1 tablet (0.4 mg total) under the tongue every 5 (five) minutes as needed for chest pain. 11/12/20   Juline Patch, MD    Allergies as of 12/05/2020 - Review Complete 12/05/2020  Allergen Reaction Noted   Latex Itching 11/14/2014   Pravastatin sodium  10/01/2020    Family History  Problem Relation Age of Onset   Diabetes Mother    Hypertension Mother    Breast cancer Sister 52   Breast cancer Cousin        2 mat cousins     Social History   Socioeconomic History   Marital status: Married    Spouse name: Not on file   Number of children: 2   Years of education: Not on file   Highest education level: Associate degree: academic program  Occupational History   Not on file  Tobacco Use   Smoking status: Former    Packs/day: 0.50    Years: 18.00    Pack years: 9.00    Types: Cigarettes   Smokeless tobacco: Never  Vaping Use   Vaping Use: Never used  Substance and Sexual Activity   Alcohol use: No    Alcohol/week: 0.0 standard drinks   Drug use: No   Sexual activity: Never  Other Topics Concern   Not on file  Social History Narrative   Not on file   Social Determinants of Health   Financial Resource Strain: Low Risk    Difficulty of Paying Living Expenses: Not very hard  Food Insecurity: No Food Insecurity   Worried About Charity fundraiser in the Last Year: Never true   Ran Out of Food in the Last Year: Never true  Transportation Needs: No Transportation Needs   Lack of Transportation (Medical): No   Lack of Transportation (Non-Medical): No  Physical Activity: Inactive   Days of Exercise per Week: 0 days   Minutes of Exercise per Session: 0 min  Stress: No Stress Concern Present   Feeling of Stress : Not at all  Social Connections: Moderately Integrated   Frequency of Communication with Friends and Family: More than three times a week   Frequency of Social Gatherings with Friends and Family: More than three times a week   Attends Religious Services: More than 4 times per year   Active Member of [employer] or Organizations: No   Attends Archivist Meetings: Never   Marital Status: Married  Human resources officer Violence: Not At Risk   Fear of Current or Ex-Partner: No   Emotionally Abused: No   Physically Abused: No   Sexually Abused: No    Review of Systems: See HPI, otherwise negative ROS  Physical Exam: BP 134/79   Pulse 62   Temp 97.6 F (36.4 C) (Temporal)   Ht 5'  5" (1.651 m)   Wt 73.9 kg   SpO2 97%   BMI 27.12 kg/m  General:   Alert,  pleasant and cooperative in NAD Head:  Normocephalic and atraumatic. Neck:  Supple; no masses or thyromegaly. Lungs:  Clear throughout to auscultation.    Heart:  Regular rate and rhythm. Abdomen:  Soft, nontender and nondistended. Normal bowel sounds, without guarding, and without rebound.   Neurologic:  Alert and  oriented x4;  grossly normal neurologically.  Impression/Plan: [name] is here for an endoscopy to be performed for dysphagia  Risks, benefits, limitations, and alternatives regarding  endoscopy have been reviewed with the patient.  Questions  have been answered.  All parties agreeable.   Sherri Sear, MD  01/22/2021, 8:51 AM

## 2021-01-22 NOTE — Anesthesia Postprocedure Evaluation (Signed)
Anesthesia Post Note  Patient: Taylor Griffin  Procedure(s) Performed: ESOPHAGOGASTRODUODENOSCOPY (EGD) WITH PROPOFOL (Throat)     Patient location during evaluation: PACU Anesthesia Type: General Level of consciousness: awake and alert Pain management: pain level controlled Vital Signs Assessment: post-procedure vital signs reviewed and stable Respiratory status: spontaneous breathing, nonlabored ventilation, respiratory function stable and patient connected to nasal cannula oxygen Cardiovascular status: blood pressure returned to baseline and stable Postop Assessment: no apparent nausea or vomiting Anesthetic complications: no   No notable events documented.  Milus Fritze A  Jarian Longoria

## 2021-01-23 ENCOUNTER — Encounter: Payer: Self-pay | Admitting: Gastroenterology

## 2021-02-03 ENCOUNTER — Other Ambulatory Visit: Payer: Self-pay

## 2021-02-03 ENCOUNTER — Telehealth: Payer: Self-pay | Admitting: Gastroenterology

## 2021-02-03 DIAGNOSIS — R1319 Other dysphagia: Secondary | ICD-10-CM

## 2021-02-03 MED ORDER — OMEPRAZOLE 40 MG PO CPDR: 40.0000 mg | DELAYED_RELEASE_CAPSULE | Freq: Every day | ORAL | 0 refills | Status: DC

## 2021-02-03 NOTE — Progress Notes (Signed)
Sent in refill as requested 

## 2021-02-03 NOTE — Telephone Encounter (Signed)
Inbound call from pt requesting a refill for Omeprazole. Please advise. Thank you.

## 2021-02-09 ENCOUNTER — Ambulatory Visit (INDEPENDENT_AMBULATORY_CARE_PROVIDER_SITE_OTHER): Payer: Medicare HMO

## 2021-02-09 ENCOUNTER — Other Ambulatory Visit: Payer: Self-pay

## 2021-02-09 DIAGNOSIS — Z23 Encounter for immunization: Secondary | ICD-10-CM

## 2021-02-27 ENCOUNTER — Other Ambulatory Visit: Payer: Self-pay | Admitting: Gastroenterology

## 2021-02-27 DIAGNOSIS — R1319 Other dysphagia: Secondary | ICD-10-CM

## 2021-03-03 ENCOUNTER — Other Ambulatory Visit: Payer: Self-pay

## 2021-03-03 DIAGNOSIS — R1319 Other dysphagia: Secondary | ICD-10-CM

## 2021-03-03 MED ORDER — OMEPRAZOLE 40 MG PO CPDR: 40.0000 mg | DELAYED_RELEASE_CAPSULE | Freq: Every day | ORAL | 0 refills | Status: DC

## 2021-03-03 NOTE — Progress Notes (Signed)
Sent in refill for prescription as requested

## 2021-03-07 ENCOUNTER — Other Ambulatory Visit: Payer: Self-pay | Admitting: Family Medicine

## 2021-03-07 DIAGNOSIS — I10 Essential (primary) hypertension: Secondary | ICD-10-CM

## 2021-03-07 NOTE — Telephone Encounter (Signed)
Requested Prescriptions  Pending Prescriptions Disp Refills   losartan (COZAAR) 100 MG tablet [Pharmacy Med Name: LOSARTAN POTASSIUM 100 MG TAB] 90 tablet 0    Sig: TAKE 1 TABLET BY MOUTH EVERY DAY     Cardiovascular:  Angiotensin Receptor Blockers Failed - 03/07/2021  9:26 AM      Failed - Cr in normal range and within 180 days    Creatinine, Ser  Date Value Ref Range Status  09/18/2020 1.27 (H) 0.57 - 1.00 mg/dL Final         Passed - K in normal range and within 180 days    Potassium  Date Value Ref Range Status  09/18/2020 4.3 3.5 - 5.2 mmol/L Final         Passed - Patient is not pregnant      Passed - Last BP in normal range    BP Readings from Last 1 Encounters:  01/22/21 110/71         Passed - Valid encounter within last 6 months    Recent Outpatient Visits          3 months ago Chest pain, unspecified type   [facility] Juline Patch, MD   5 months ago Essential hypertension   [facility] [facility], Deanna C, MD   1 year ago Essential hypertension   [facility], Deanna C, MD   1 year ago Statin intolerance   [facility] Juline Patch, MD   1 year ago Anemia due to folic acid deficiency, unspecified deficiency type   [facility] Juline Patch, MD      Future Appointments            In 2 weeks Juline Patch, MD [facility], [facility]

## 2021-03-23 ENCOUNTER — Ambulatory Visit (INDEPENDENT_AMBULATORY_CARE_PROVIDER_SITE_OTHER): Payer: Medicare HMO | Admitting: Family Medicine

## 2021-03-23 ENCOUNTER — Other Ambulatory Visit: Payer: Self-pay

## 2021-03-23 ENCOUNTER — Encounter: Payer: Self-pay | Admitting: Family Medicine

## 2021-03-23 VITALS — BP 120/70 | HR 60 | Ht 65.0 in | Wt 161.0 lb

## 2021-03-23 DIAGNOSIS — I10 Essential (primary) hypertension: Secondary | ICD-10-CM | POA: Diagnosis not present

## 2021-03-23 DIAGNOSIS — D529 Folate deficiency anemia, unspecified: Secondary | ICD-10-CM

## 2021-03-23 DIAGNOSIS — E785 Hyperlipidemia, unspecified: Secondary | ICD-10-CM | POA: Diagnosis not present

## 2021-03-23 MED ORDER — METOPROLOL SUCCINATE ER 50 MG PO TB24: 50.0000 mg | ORAL_TABLET | Freq: Every day | ORAL | 1 refills | Status: DC

## 2021-03-23 MED ORDER — HYDROCHLOROTHIAZIDE 25 MG PO TABS: 25.0000 mg | ORAL_TABLET | Freq: Every day | ORAL | 1 refills | Status: DC

## 2021-03-23 MED ORDER — FOLIC ACID 800 MCG PO TABS: ORAL_TABLET | ORAL | 1 refills | Status: DC

## 2021-03-23 MED ORDER — LOSARTAN POTASSIUM 100 MG PO TABS: 100.0000 mg | ORAL_TABLET | Freq: Every day | ORAL | 1 refills | Status: DC

## 2021-03-23 MED ORDER — EZETIMIBE 10 MG PO TABS: 10.0000 mg | ORAL_TABLET | Freq: Every day | ORAL | 1 refills | Status: DC

## 2021-03-23 NOTE — Progress Notes (Signed)
Date:  03/23/2021   Name:  Taylor Griffin   DOB:  1950/12/24   MRN:  [id number]   Chief Complaint: Hypertension, Hyperlipidemia, Anemia, and pneumonia vacc  Hypertension This is a chronic problem. The current episode started more than 1 year ago. The problem has been gradually improving since onset. The problem is controlled. Pertinent negatives include no anxiety, blurred vision, chest pain, headaches, malaise/fatigue, neck pain, orthopnea, palpitations, peripheral edema, PND, shortness of breath or sweats. There are no associated agents to hypertension. Risk factors for coronary artery disease include dyslipidemia. Past treatments include angiotensin blockers, beta blockers and diuretics. The current treatment provides moderate improvement. There are no compliance problems.  There is no history of angina, kidney disease, CAD/MI, CVA, heart failure, left ventricular hypertrophy, PVD or retinopathy. There is no history of chronic renal disease, a hypertension causing med or renovascular disease.  Hyperlipidemia This is a chronic problem. The current episode started more than 1 year ago. The problem is controlled. Recent lipid tests were reviewed and are normal. She has no history of chronic renal disease, diabetes, hypothyroidism, liver disease, obesity or nephrotic syndrome. Factors aggravating her hyperlipidemia include thiazides. Pertinent negatives include no chest pain, focal sensory loss, focal weakness, leg pain, myalgias or shortness of breath. Current antihyperlipidemic treatment includes statins. The current treatment provides moderate improvement of lipids. There are no compliance problems.  Risk factors for coronary artery disease include hypertension and dyslipidemia.  Anemia Presents for follow-up visit. There has been no abdominal pain, anorexia, bruising/bleeding easily, confusion, fever, leg swelling, light-headedness, malaise/fatigue, pallor, palpitations, paresthesias, pica or  weight loss. There is no history of chronic renal disease, heart failure or hypothyroidism.   Lab Results  Component Value Date   NA 140 09/18/2020   K 4.3 09/18/2020   CO2 27 09/18/2020   GLUCOSE 90 09/18/2020   BUN 25 09/18/2020   CREATININE 1.27 (H) 09/18/2020   CALCIUM 10.5 (H) 09/18/2020   EGFR 45 (L) 09/18/2020   GFRNONAA 40 (L) 02/11/2020   Lab Results  Component Value Date   CHOL 220 (H) 09/18/2020   HDL 40 09/18/2020   LDLCALC 142 (H) 09/18/2020   TRIG 209 (H) 09/18/2020   CHOLHDL 5.2 (H) 07/11/2017   No results found for: TSH No results found for: HGBA1C Lab Results  Component Value Date   WBC 5.1 09/18/2020   HGB 14.9 09/18/2020   HCT 44.5 09/18/2020   MCV 91 09/18/2020   PLT 378 09/18/2020   Lab Results  Component Value Date   ALT 23 02/11/2020   AST 24 02/11/2020   ALKPHOS 79 02/11/2020   BILITOT 0.9 02/11/2020   No results found for: 25OHVITD2, 25OHVITD3, VD25OH   Review of Systems  Constitutional:  Negative for chills, fever, malaise/fatigue and weight loss.  HENT:  Negative for drooling, ear discharge, ear pain and sore throat.   Eyes:  Negative for blurred vision.  Respiratory:  Negative for cough, shortness of breath and wheezing.   Cardiovascular:  Negative for chest pain, palpitations, orthopnea, leg swelling and PND.  Gastrointestinal:  Negative for abdominal pain, anorexia, blood in stool, constipation, diarrhea and nausea.  Endocrine: Negative for polydipsia.  Genitourinary:  Negative for dysuria, frequency, hematuria and urgency.  Musculoskeletal:  Negative for back pain, myalgias and neck pain.  Skin:  Negative for pallor and rash.  Allergic/Immunologic: Negative for environmental allergies.  Neurological:  Negative for dizziness, focal weakness, light-headedness, headaches and paresthesias.  Hematological:  Does not bruise/bleed easily.  Psychiatric/Behavioral:  Negative for confusion and suicidal ideas. The patient is not  nervous/anxious.    Patient Active Problem List   Diagnosis Date Noted   Spondylolysis of cervical spine 10/01/2020   Centrilobular emphysema ([place]) 08/21/2018   Dyshidrotic eczema 07/11/2017   Esophageal dysphagia    Hyperlipidemia, unspecified 05/03/2016   Hypertension 05/03/2016    Allergies  Allergen Reactions   Latex Itching   Pravastatin Sodium     Past Surgical History:  Procedure Laterality Date   COLONOSCOPY WITH PROPOFOL N/A 11/22/2014   Procedure: COLONOSCOPY WITH PROPOFOL;  Surgeon: Lucilla Lame, MD;  Location: [facility];  Service: Endoscopy;  Laterality: N/A;  LATEX ALLERGY   COLONOSCOPY WITH PROPOFOL N/A 12/14/2019   Procedure: COLONOSCOPY WITH PROPOFOL;  Surgeon: Lucilla Lame, MD;  Location: [facility];  Service: Endoscopy;  Laterality: N/A;  priority 4   ECTOPIC PREGNANCY SURGERY     ESOPHAGOGASTRODUODENOSCOPY (EGD) WITH PROPOFOL N/A 01/26/2017   Procedure: ESOPHAGOGASTRODUODENOSCOPY (EGD) WITH PROPOFOL;  Surgeon: Lin Landsman, MD;  Location: [facility];  Service: Endoscopy;  Laterality: N/A;   ESOPHAGOGASTRODUODENOSCOPY (EGD) WITH PROPOFOL N/A 01/22/2021   Procedure: ESOPHAGOGASTRODUODENOSCOPY (EGD) WITH PROPOFOL;  Surgeon: Lin Landsman, MD;  Location: [facility];  Service: Endoscopy;  Laterality: N/A;   POLYPECTOMY  12/14/2019   Procedure: POLYPECTOMY;  Surgeon: Lucilla Lame, MD;  [facility]: [facility];  Service: Endoscopy;;    Social History   Tobacco Use   Smoking status: Former    Packs/day: 0.50    Years: 18.00    Pack years: 9.00    Types: Cigarettes   Smokeless tobacco: Never  Vaping Use   Vaping Use: Never used  Substance Use Topics   Alcohol use: No    Alcohol/week: 0.0 standard drinks   Drug use: No     Medication list has been reviewed and updated.  Current Meds  Medication Sig   aspirin 81 MG tablet Take 1 tablet (81 mg total) by mouth daily. AM   Calcium Carbonate-Vit D-Min  (CALCIUM 1200 PO) Take 1 tablet by mouth daily.   cholecalciferol (VITAMIN D3) 25 MCG (1000 UNIT) tablet Take 1,000 Units by mouth daily.   ezetimibe (ZETIA) 10 MG tablet TAKE 1 TABLET BY MOUTH EVERY DAY   fluticasone (FLONASE) 50 MCG/ACT nasal spray Place 2 sprays into the nose daily. Dr A   folic acid ([employer]S FOLIC ACID) 253 MCG tablet TAKE 1 TABLET DAILY. PT NEEDS TO TAKE 1 WHOLE PILL   hydrochlorothiazide (HYDRODIURIL) 25 MG tablet Take 1 tablet (25 mg total) by mouth daily.   ipratropium-albuterol (DUONEB) 0.5-2.5 (3) MG/3ML SOLN Inhale 3 mLs into the lungs 3 (three) times daily as needed. Dr A   losartan (COZAAR) 100 MG tablet TAKE 1 TABLET BY MOUTH EVERY DAY   metoprolol succinate (TOPROL-XL) 50 MG 24 hr tablet Take 1 tablet (50 mg total) by mouth daily. TAKE WITH OR IMMEDIATELY FOLLOWING A MEAL.   Multiple Vitamins-Minerals (MULTIVITAMIN WITH MINERALS) tablet Take 1 tablet by mouth daily.   nitroGLYCERIN (NITROSTAT) 0.4 MG SL tablet Place 1 tablet (0.4 mg total) under the tongue every 5 (five) minutes as needed for chest pain.   omeprazole (PRILOSEC) 40 MG capsule TAKE 1 CAPSULE (40 MG TOTAL) BY MOUTH DAILY.   omeprazole (PRILOSEC) 40 MG capsule Take 1 capsule (40 mg total) by mouth daily.   VENTOLIN HFA 108 (90 Base) MCG/ACT inhaler Dr A   [DISCONTINUED] Glycopyrrolate-Formoterol (BEVESPI AEROSPHERE) 9-4.8 MCG/ACT AERO Inhale into the  lungs daily. Dr. Loni Muse    [facility] 2/9 Scores 03/23/2021 11/12/2020 10/01/2020 09/18/2020  PHQ - 2 Score 0 0 0 0  PHQ- 9 Score 0 0 - 0    GAD 7 : Generalized Anxiety Score 03/23/2021 09/18/2020 02/11/2020 09/19/2019  Nervous, Anxious, on Edge 0 0 0 0  Control/stop worrying 0 0 0 0  Worry too much - different things 0 0 0 0  Trouble relaxing 0 0 0 0  Restless 0 0 0 0  Easily annoyed or irritable 0 0 0 0  Afraid - awful might happen 0 0 0 0  Total GAD 7 Score 0 0 0 0  Anxiety Difficulty Not difficult at all - - -    BP Readings from Last 3 Encounters:  03/23/21  120/70  01/22/21 110/71  12/05/20 (!) 144/81    Physical Exam Vitals and nursing note reviewed.  Constitutional:      Appearance: She is well-developed.  HENT:     Head: Normocephalic.     Right Ear: Tympanic membrane, ear canal and external ear normal.     Left Ear: Tympanic membrane, ear canal and external ear normal.     Nose: Nose normal.  Eyes:     General: Lids are everted, no foreign bodies appreciated. No scleral icterus.       Left eye: No foreign body or hordeolum.     Conjunctiva/sclera: Conjunctivae normal.     Right eye: Right conjunctiva is not injected.     Left eye: Left conjunctiva is not injected.     Pupils: Pupils are equal, round, and reactive to light.  Neck:     Thyroid: No thyromegaly.     Vascular: No JVD.     Trachea: No tracheal deviation.  Cardiovascular:     Rate and Rhythm: Normal rate and regular rhythm.     Heart sounds: Normal heart sounds. No murmur heard.   No friction rub. No gallop.  Pulmonary:     Effort: Pulmonary effort is normal. No respiratory distress.     Breath sounds: Normal breath sounds. No wheezing, rhonchi or rales.  Abdominal:     General: Bowel sounds are normal.     Palpations: Abdomen is soft. There is no mass.     Tenderness: There is no abdominal tenderness. There is no guarding or rebound.  Musculoskeletal:        General: No tenderness. Normal range of motion.     Cervical back: Normal range of motion and neck supple.  Lymphadenopathy:     Cervical: No cervical adenopathy.  Skin:    General: Skin is warm.     Findings: No bruising, erythema or rash.  Neurological:     Mental Status: She is alert and oriented to person, place, and time.     Cranial Nerves: No cranial nerve deficit.     Deep Tendon Reflexes: Reflexes normal.  Psychiatric:        Mood and Affect: Mood is not anxious or depressed.    Wt Readings from Last 3 Encounters:  03/23/21 161 lb (73 kg)  01/22/21 163 lb (73.9 kg)  12/16/20 162 lb (73.5  kg)    BP 120/70    Pulse 60    Ht '5\' 5"'  (1.651 m)    Wt 161 lb (73 kg)    BMI 26.79 kg/m   Assessment and Plan:  1. Essential hypertension Chronic.  Controlled.  Stable.  Blood pressure 120/70.  Continue hydrochlorothiazide 25 mg, losartan 100  mg once a day, and metoprolol XL 50 mg once a day.  Will check renal function panel for electrolytes and GFR. - hydrochlorothiazide (HYDRODIURIL) 25 MG tablet; Take 1 tablet (25 mg total) by mouth daily.  Dispense: 90 tablet; Refill: 1 - losartan (COZAAR) 100 MG tablet; Take 1 tablet (100 mg total) by mouth daily.  Dispense: 90 tablet; Refill: 1 - metoprolol succinate (TOPROL-XL) 50 MG 24 hr tablet; Take 1 tablet (50 mg total) by mouth daily. TAKE WITH OR IMMEDIATELY FOLLOWING A MEAL.  Dispense: 90 tablet; Refill: 1 - Renal Function Panel  2. Hyperlipidemia, unspecified hyperlipidemia type Chronic.  Controlled.  Stable.  Continue Zetia 10 mg once a day.  Will check lipid panel for current status of LDL. - ezetimibe (ZETIA) 10 MG tablet; Take 1 tablet (10 mg total) by mouth daily.  Dispense: 90 tablet; Refill: 1 - Lipid Panel With LDL/HDL Ratio  3. Anemia due to folic acid deficiency, unspecified deficiency type Chronic.  Controlled.  Stable.  Patient has a history of folic acid deficiency and that is countered with folic acid supplementation 800 mcg daily.  We will check CBC for current status of hemoglobin hematocrit and indices - folic acid (CVS FOLIC ACID) 379 MCG tablet; TAKE 1 TABLET DAILY. PT NEEDS TO TAKE 1 WHOLE PILL  Dispense: 90 tablet; Refill: 1 - CBC w/Diff/Platelet

## 2021-03-24 LAB — CBC WITH DIFFERENTIAL/PLATELET
Basophils Absolute: 0.1 10*3/uL (ref 0.0–0.2)
Basos: 1 %
EOS (ABSOLUTE): 0.3 10*3/uL (ref 0.0–0.4)
Eos: 6 %
Hematocrit: 44.1 % (ref 34.0–46.6)
Hemoglobin: 14.8 g/dL (ref 11.1–15.9)
Immature Grans (Abs): 0 10*3/uL (ref 0.0–0.1)
Immature Granulocytes: 0 %
Lymphocytes Absolute: 1.9 10*3/uL (ref 0.7–3.1)
Lymphs: 41 %
MCH: 31.1 pg (ref 26.6–33.0)
MCHC: 33.6 g/dL (ref 31.5–35.7)
MCV: 93 fL (ref 79–97)
Monocytes Absolute: 0.5 10*3/uL (ref 0.1–0.9)
Monocytes: 10 %
Neutrophils Absolute: 1.9 10*3/uL (ref 1.4–7.0)
Neutrophils: 42 %
Platelets: 335 10*3/uL (ref 150–450)
RBC: 4.76 x10E6/uL (ref 3.77–5.28)
RDW: 12.9 % (ref 11.7–15.4)
WBC: 4.6 10*3/uL (ref 3.4–10.8)

## 2021-03-24 LAB — LIPID PANEL WITH LDL/HDL RATIO
Cholesterol, Total: 201 mg/dL — ABNORMAL HIGH (ref 100–199)
HDL: 44 mg/dL (ref >39)
LDL Chol Calc (NIH): 127 mg/dL — ABNORMAL HIGH (ref 0–99)
LDL/HDL Ratio: 2.9 ratio (ref 0.0–3.2)
Triglycerides: 168 mg/dL — ABNORMAL HIGH (ref 0–149)
VLDL Cholesterol Cal: 30 mg/dL (ref 5–40)

## 2021-03-24 LAB — RENAL FUNCTION PANEL
Albumin: 4.5 g/dL (ref 3.7–4.7)
BUN/Creatinine Ratio: 17 (ref 12–28)
BUN: 22 mg/dL (ref 8–27)
CO2: 26 mmol/L (ref 20–29)
Calcium: 10 mg/dL (ref 8.7–10.3)
Chloride: 101 mmol/L (ref 96–106)
Creatinine, Ser: 1.31 mg/dL — ABNORMAL HIGH (ref 0.57–1.00)
Glucose: 94 mg/dL (ref 70–99)
Phosphorus: 4.2 mg/dL (ref 3.0–4.3)
Potassium: 3.9 mmol/L (ref 3.5–5.2)
Sodium: 144 mmol/L (ref 134–144)
eGFR: 44 mL/min/{1.73_m2} — ABNORMAL LOW (ref >59)

## 2021-03-31 ENCOUNTER — Other Ambulatory Visit: Payer: Self-pay | Admitting: Gastroenterology

## 2021-03-31 DIAGNOSIS — R1319 Other dysphagia: Secondary | ICD-10-CM

## 2021-04-07 ENCOUNTER — Other Ambulatory Visit: Payer: Self-pay

## 2021-04-07 ENCOUNTER — Ambulatory Visit: Payer: Medicare HMO | Attending: Internal Medicine

## 2021-04-07 DIAGNOSIS — Z23 Encounter for immunization: Secondary | ICD-10-CM

## 2021-04-07 MED ORDER — MODERNA COVID-19 BIVAL BOOSTER 50 MCG/0.5ML IM SUSP
INTRAMUSCULAR | 0 refills | Status: DC
  Filled 2021-04-07: qty 0.5, 1d supply, fill #0

## 2021-04-07 NOTE — Progress Notes (Signed)
° °  Covid-19 Vaccination Clinic  Name:  Taylor Griffin    MRN: [id number] DOB: [birth year]  04/07/2021  Taylor Griffin was observed post Covid-19 immunization for 15 minutes without incident. She was provided with Vaccine Information Sheet and instruction to access the V-Safe system.   Taylor Griffin was instructed to call 911 with any severe reactions post vaccine: Difficulty breathing  Swelling of face and throat  A fast heartbeat  A bad rash all over body  Dizziness and weakness   Immunizations Administered     Name Date Dose VIS Date Route   Moderna Covid-19 vaccine Bivalent Booster 04/07/2021 10:22 AM 0.5 mL 10/18/2020 Intramuscular   Manufacturer: Levan Hurst   Lot: [id number]   NDC: 90383-338-32      Lu Duffel, PharmD, MBA Clinical Acute Care Pharmacist

## 2021-04-26 ENCOUNTER — Other Ambulatory Visit: Payer: Self-pay | Admitting: Gastroenterology

## 2021-04-26 DIAGNOSIS — R1319 Other dysphagia: Secondary | ICD-10-CM

## 2021-09-21 ENCOUNTER — Encounter: Payer: Self-pay | Admitting: Family Medicine

## 2021-09-21 ENCOUNTER — Ambulatory Visit (INDEPENDENT_AMBULATORY_CARE_PROVIDER_SITE_OTHER): Payer: Medicare HMO | Admitting: Family Medicine

## 2021-09-21 VITALS — BP 130/70 | HR 72 | Ht 65.0 in | Wt 158.0 lb

## 2021-09-21 DIAGNOSIS — H6123 Impacted cerumen, bilateral: Secondary | ICD-10-CM

## 2021-09-21 DIAGNOSIS — D529 Folate deficiency anemia, unspecified: Secondary | ICD-10-CM | POA: Diagnosis not present

## 2021-09-21 DIAGNOSIS — E785 Hyperlipidemia, unspecified: Secondary | ICD-10-CM

## 2021-09-21 DIAGNOSIS — L309 Dermatitis, unspecified: Secondary | ICD-10-CM | POA: Diagnosis not present

## 2021-09-21 DIAGNOSIS — I10 Essential (primary) hypertension: Secondary | ICD-10-CM | POA: Diagnosis not present

## 2021-09-21 MED ORDER — LOSARTAN POTASSIUM 100 MG PO TABS: 100.0000 mg | ORAL_TABLET | Freq: Every day | ORAL | 1 refills | Status: DC

## 2021-09-21 MED ORDER — TRIAMCINOLONE ACETONIDE 0.1 % EX CREA: 1.0000 | TOPICAL_CREAM | Freq: Two times a day (BID) | CUTANEOUS | 0 refills | Status: DC

## 2021-09-21 MED ORDER — FOLIC ACID 800 MCG PO TABS: ORAL_TABLET | ORAL | 1 refills | Status: DC

## 2021-09-21 MED ORDER — METOPROLOL SUCCINATE ER 50 MG PO TB24: 50.0000 mg | ORAL_TABLET | Freq: Every day | ORAL | 1 refills | Status: DC

## 2021-09-21 MED ORDER — HYDROCHLOROTHIAZIDE 25 MG PO TABS: 25.0000 mg | ORAL_TABLET | Freq: Every day | ORAL | 1 refills | Status: DC

## 2021-09-21 MED ORDER — EZETIMIBE 10 MG PO TABS: 10.0000 mg | ORAL_TABLET | Freq: Every day | ORAL | 1 refills | Status: DC

## 2021-09-21 NOTE — Progress Notes (Signed)
Date:  09/21/2021   Name:  Taylor Griffin   DOB:  1950/05/23   MRN:  [id number]   Chief Complaint: Anemia, Hypertension, Hyperlipidemia, and Ear Fullness  Anemia Presents for follow-up visit. There has been no abdominal pain, bruising/bleeding easily, fever, light-headedness, malaise/fatigue, palpitations or paresthesias. Signs of blood loss that are not present include hematemesis and hematochezia. There is no history of chronic renal disease. There are no compliance problems.   Hypertension This is a chronic problem. The current episode started more than 1 year ago. The problem has been gradually improving since onset. The problem is controlled. Pertinent negatives include no blurred vision, chest pain, headaches, malaise/fatigue, orthopnea, palpitations, PND or shortness of breath. There are no associated agents to hypertension. Past treatments include beta blockers, angiotensin blockers and diuretics. There is no history of chronic renal disease.  Hyperlipidemia This is a chronic problem. The current episode started more than 1 year ago. Recent lipid tests were reviewed and are normal. She has no history of chronic renal disease or diabetes. There are no known factors aggravating her hyperlipidemia. Pertinent negatives include no chest pain, focal weakness, myalgias or shortness of breath. Current antihyperlipidemic treatment includes ezetimibe. The current treatment provides moderate improvement of lipids. There are no compliance problems.   Ear Fullness  There is pain in both ears. The current episode started 1 to 4 weeks ago. The problem has been unchanged. Pertinent negatives include no abdominal pain, coughing or headaches.    Lab Results  Component Value Date   NA 144 03/23/2021   K 3.9 03/23/2021   CO2 26 03/23/2021   GLUCOSE 94 03/23/2021   BUN 22 03/23/2021   CREATININE 1.31 (H) 03/23/2021   CALCIUM 10.0 03/23/2021   EGFR 44 (L) 03/23/2021   GFRNONAA 40 (L) 02/11/2020    Lab Results  Component Value Date   CHOL 201 (H) 03/23/2021   HDL 44 03/23/2021   LDLCALC 127 (H) 03/23/2021   TRIG 168 (H) 03/23/2021   CHOLHDL 5.2 (H) 07/11/2017   No results found for: "TSH" No results found for: "HGBA1C" Lab Results  Component Value Date   WBC 4.6 03/23/2021   HGB 14.8 03/23/2021   HCT 44.1 03/23/2021   MCV 93 03/23/2021   PLT 335 03/23/2021   Lab Results  Component Value Date   ALT 23 02/11/2020   AST 24 02/11/2020   ALKPHOS 79 02/11/2020   BILITOT 0.9 02/11/2020   No results found for: "25OHVITD2", "25OHVITD3", "VD25OH"   Review of Systems  Constitutional:  Negative for fever and malaise/fatigue.  HENT:  Negative for sinus pressure and sinus pain.   Eyes:  Negative for blurred vision.  Respiratory:  Negative for cough, chest tightness, shortness of breath and wheezing.   Cardiovascular:  Negative for chest pain, palpitations, orthopnea, leg swelling and PND.  Gastrointestinal:  Negative for abdominal pain, constipation, hematemesis and hematochezia.  Endocrine: Negative for polydipsia, polyphagia and polyuria.  Genitourinary:  Negative for difficulty urinating.  Musculoskeletal:  Negative for myalgias.  Neurological:  Negative for focal weakness, light-headedness, headaches and paresthesias.  Hematological:  Does not bruise/bleed easily.    Patient Active Problem List   Diagnosis Date Noted   Spondylolysis of cervical spine 10/01/2020   Centrilobular emphysema ([place]) 08/21/2018   Dyshidrotic eczema 07/11/2017   Esophageal dysphagia    Hyperlipidemia, unspecified 05/03/2016   Hypertension 05/03/2016    Allergies  Allergen Reactions   Latex Itching   Pravastatin Sodium  Past Surgical History:  Procedure Laterality Date   COLONOSCOPY WITH PROPOFOL N/A 11/22/2014   Procedure: COLONOSCOPY WITH PROPOFOL;  Surgeon: Lucilla Lame, MD;  Location: [facility];  Service: Endoscopy;  Laterality: N/A;  LATEX ALLERGY   COLONOSCOPY WITH  PROPOFOL N/A 12/14/2019   Procedure: COLONOSCOPY WITH PROPOFOL;  Surgeon: Lucilla Lame, MD;  Location: [facility];  Service: Endoscopy;  Laterality: N/A;  priority 4   ECTOPIC PREGNANCY SURGERY     ESOPHAGOGASTRODUODENOSCOPY (EGD) WITH PROPOFOL N/A 01/26/2017   Procedure: ESOPHAGOGASTRODUODENOSCOPY (EGD) WITH PROPOFOL;  Surgeon: Lin Landsman, MD;  Location: [facility];  Service: Endoscopy;  Laterality: N/A;   ESOPHAGOGASTRODUODENOSCOPY (EGD) WITH PROPOFOL N/A 01/22/2021   Procedure: ESOPHAGOGASTRODUODENOSCOPY (EGD) WITH PROPOFOL;  Surgeon: Lin Landsman, MD;  Location: [place] [facility];  Service: Endoscopy;  Laterality: N/A;   POLYPECTOMY  12/14/2019   Procedure: POLYPECTOMY;  Surgeon: Lucilla Lame, MD;  Location: [facility];  Service: Endoscopy;;    Social History   Tobacco Use   Smoking status: Former    Packs/day: 0.50    Years: 18.00    Total pack years: 9.00    Types: Cigarettes   Smokeless tobacco: Never  Vaping Use   Vaping Use: Never used  Substance Use Topics   Alcohol use: No    Alcohol/week: 0.0 standard drinks of alcohol   Drug use: No     Medication list has been reviewed and updated.  Current Meds  Medication Sig   aspirin 81 MG tablet Take 1 tablet (81 mg total) by mouth daily. AM   Calcium Carbonate-Vit D-Min (CALCIUM 1200 PO) Take 1 tablet by mouth daily.   cholecalciferol (VITAMIN D3) 25 MCG (1000 UNIT) tablet Take 1,000 Units by mouth daily.   COVID-19 mRNA bivalent vaccine, Moderna, (MODERNA COVID-19 BIVAL BOOSTER) 50 MCG/0.5ML injection Inject into the muscle.   ezetimibe (ZETIA) 10 MG tablet Take 1 tablet (10 mg total) by mouth daily.   folic acid ([employer] FOLIC ACID) 446 MCG tablet TAKE 1 TABLET DAILY. PT NEEDS TO TAKE 1 WHOLE PILL   hydrochlorothiazide (HYDRODIURIL) 25 MG tablet Take 1 tablet (25 mg total) by mouth daily.   losartan (COZAAR) 100 MG tablet Take 1 tablet (100 mg total) by mouth daily.   metoprolol  succinate (TOPROL-XL) 50 MG 24 hr tablet Take 1 tablet (50 mg total) by mouth daily. TAKE WITH OR IMMEDIATELY FOLLOWING A MEAL.   Multiple Vitamins-Minerals (MULTIVITAMIN WITH MINERALS) tablet Take 1 tablet by mouth daily.   nitroGLYCERIN (NITROSTAT) 0.4 MG SL tablet Place 1 tablet (0.4 mg total) under the tongue every 5 (five) minutes as needed for chest pain.   omeprazole (PRILOSEC) 40 MG capsule TAKE 1 CAPSULE (40 MG TOTAL) BY MOUTH DAILY.   VENTOLIN HFA 108 (90 Base) MCG/ACT inhaler Dr A       03/23/2021   10:36 AM 09/18/2020   11:09 AM 02/11/2020    2:02 PM 09/19/2019   10:30 AM  GAD 7 : Generalized Anxiety Score  Nervous, Anxious, on Edge 0 0 0 0  Control/stop worrying 0 0 0 0  Worry too much - different things 0 0 0 0  Trouble relaxing 0 0 0 0  Restless 0 0 0 0  Easily annoyed or irritable 0 0 0 0  Afraid - awful might happen 0 0 0 0  Total GAD 7 Score 0 0 0 0  Anxiety Difficulty Not difficult at all  03/23/2021   10:36 AM 11/12/2020   10:52 AM 10/01/2020   10:13 AM  Depression screen PHQ 2/9  Decreased Interest 0 0 0  Down, Depressed, Hopeless 0 0 0  PHQ - 2 Score 0 0 0  Altered sleeping 0 0   Tired, decreased energy 0 0   Change in appetite 0 0   Feeling bad or failure about yourself  0 0   Trouble concentrating 0 0   Moving slowly or fidgety/restless 0 0   Suicidal thoughts 0 0   PHQ-9 Score 0 0   Difficult doing work/chores Not difficult at all Not difficult at all     BP Readings from Last 3 Encounters:  09/21/21 130/70  03/23/21 120/70  01/22/21 110/71    Physical Exam Vitals and nursing note reviewed.  Constitutional:      Appearance: She is well-developed.  HENT:     Head: Normocephalic.     Right Ear: External ear normal.     Left Ear: External ear normal.     Mouth/Throat:     Mouth: Mucous membranes are moist.  Eyes:     General: Lids are everted, no foreign bodies appreciated. No scleral icterus.       Left eye: No foreign body or  hordeolum.     Conjunctiva/sclera: Conjunctivae normal.     Right eye: Right conjunctiva is not injected.     Left eye: Left conjunctiva is not injected.     Pupils: Pupils are equal, round, and reactive to light.  Neck:     Thyroid: No thyromegaly.     Vascular: No JVD.     Trachea: No tracheal deviation.  Cardiovascular:     Rate and Rhythm: Normal rate and regular rhythm.     Heart sounds: Normal heart sounds, S1 normal and S2 normal. No murmur heard.    No systolic murmur is present.     No diastolic murmur is present.     No friction rub. No gallop. No S3 or S4 sounds.  Pulmonary:     Effort: Pulmonary effort is normal. No respiratory distress.     Breath sounds: Normal breath sounds. No wheezing, rhonchi or rales.  Abdominal:     General: Bowel sounds are normal.     Palpations: Abdomen is soft. There is no mass.     Tenderness: There is no abdominal tenderness. There is no guarding or rebound.  Musculoskeletal:        General: No tenderness. Normal range of motion.     Cervical back: Normal range of motion and neck supple.     Right lower leg: No edema.     Left lower leg: No edema.  Lymphadenopathy:     Cervical: No cervical adenopathy.  Skin:    General: Skin is warm.     Findings: No rash.  Neurological:     Mental Status: She is alert and oriented to person, place, and time.     Cranial Nerves: No cranial nerve deficit.     Deep Tendon Reflexes: Reflexes normal.  Psychiatric:        Mood and Affect: Mood is not anxious or depressed.     Wt Readings from Last 3 Encounters:  09/21/21 158 lb (71.7 kg)  03/23/21 161 lb (73 kg)  01/22/21 163 lb (73.9 kg)    BP 130/70   Pulse 72   Ht _0  (1.651 m)   Wt 158 lb (71.7 kg)   BMI 26.29 kg/m  Assessment and Plan:  1. Essential hypertension Chronic.  Controlled.  Stable.  Blood pressure 130/70.  Continue hydrochlorothiazide 25 mg once a day, losartan 100 mg once a day, and Toprol XL 50 mg once a day.  We will  check CMP for electrolytes and GFR. - hydrochlorothiazide (HYDRODIURIL) 25 MG tablet; Take 1 tablet (25 mg total) by mouth daily.  Dispense: 90 tablet; Refill: 1 - losartan (COZAAR) 100 MG tablet; Take 1 tablet (100 mg total) by mouth daily.  Dispense: 90 tablet; Refill: 1 - metoprolol succinate (TOPROL-XL) 50 MG 24 hr tablet; Take 1 tablet (50 mg total) by mouth daily. TAKE WITH OR IMMEDIATELY FOLLOWING A MEAL.  Dispense: 90 tablet; Refill: 1 - Comprehensive Metabolic Panel (CMET)  2. Anemia due to folic acid deficiency, unspecified deficiency type Chronic.  Controlled.  Stable.  Continue folic acid 701 mg daily. - folic acid ([employer]S FOLIC ACID) 779 MCG tablet; TAKE 1 TABLET DAILY. PT NEEDS TO TAKE 1 WHOLE PILL  Dispense: 90 tablet; Refill: 1  3. Hyperlipidemia, unspecified hyperlipidemia type Chronic.  Controlled.  Stable.  Continue Zetia 10 mg once a day. - ezetimibe (ZETIA) 10 MG tablet; Take 1 tablet (10 mg total) by mouth daily.  Dispense: 90 tablet; Refill: 1  4. Bilateral impacted cerumen Patient with bilateral cerumen impaction.  She thought she heard better after she used Q-tips however I think she will continue to use Q-tips but fortunately she use some Debrox before she came in so after some effort it was able to remove a rather significant cerumen plug.  Patient has been told not to use Q-tips however.  5. Eczema, unspecified type Area on the foot that is pruritic that patient has used triamcinolone cream on him in the past.  I have suggested that if it continues to remain that it needs to have a dermatology consult she would like to get the medication a trial first. - triamcinolone cream (KENALOG) 0.1 %; Apply 1 Application topically 2 (two) times daily.  Dispense: 30 g; Refill: 0

## 2021-09-22 LAB — COMPREHENSIVE METABOLIC PANEL
ALT: 13 IU/L (ref 0–32)
AST: 21 IU/L (ref 0–40)
Albumin/Globulin Ratio: 1.7 (ref 1.2–2.2)
Albumin: 4.5 g/dL (ref 3.8–4.8)
Alkaline Phosphatase: 82 IU/L (ref 44–121)
BUN/Creatinine Ratio: 16 (ref 12–28)
BUN: 20 mg/dL (ref 8–27)
Bilirubin Total: 0.9 mg/dL (ref 0.0–1.2)
CO2: 26 mmol/L (ref 20–29)
Calcium: 10.1 mg/dL (ref 8.7–10.3)
Chloride: 101 mmol/L (ref 96–106)
Creatinine, Ser: 1.22 mg/dL — ABNORMAL HIGH (ref 0.57–1.00)
Globulin, Total: 2.7 g/dL (ref 1.5–4.5)
Glucose: 101 mg/dL — ABNORMAL HIGH (ref 70–99)
Potassium: 3.8 mmol/L (ref 3.5–5.2)
Sodium: 142 mmol/L (ref 134–144)
Total Protein: 7.2 g/dL (ref 6.0–8.5)
eGFR: 47 mL/min/{1.73_m2} — ABNORMAL LOW (ref >59)

## 2021-10-05 ENCOUNTER — Encounter: Payer: Self-pay | Admitting: Emergency Medicine

## 2021-10-05 ENCOUNTER — Ambulatory Visit: Payer: Medicare HMO | Admitting: Emergency Medicine

## 2021-10-05 DIAGNOSIS — Z0000 Encounter for general adult medical examination without abnormal findings: Secondary | ICD-10-CM | POA: Diagnosis not present

## 2021-10-05 NOTE — Progress Notes (Signed)
Annual Wellness Visit  Subjective:   Taylor Griffin is a 71 y.o. Female who presents for Medicare Annual (Subsequent) preventive examination.  I connected with  Taylor Griffin on 10/05/21 by a audio enabled telemedicine application and verified that I am speaking with the correct person using two identifiers.  Patient Location: Home  Provider Location: Home Office  I discussed the limitations of evaluation and management by telemedicine. The patient expressed understanding and agreed to proceed.   Taylor Griffin is a 71 y.o. female who presents today for her Annual Wellness Visit. She sometimes exercises 2 days a week for 20 min at a time. We discussed ways to gradually increase this and ideas for doing it safely with her COPD  She generally feels well. She reports sleeping well. She does not have additional problems to discuss today.        Objective:    There were no vitals filed for this visit.   BP Readings from Last 3 Encounters:  09/21/21 130/70  03/23/21 120/70  01/22/21 110/71   Wt Readings from Last 3 Encounters:  09/21/21 158 lb (71.7 kg)  03/23/21 161 lb (73 kg)  01/22/21 163 lb (73.9 kg)      There is no height or weight on file to calculate BMI.     01/22/2021    8:38 AM 10/01/2020   10:15 AM 12/14/2019    6:44 AM 09/12/2019   10:21 AM 09/06/2018   10:56 AM 01/26/2017    7:11 AM 11/22/2014    6:52 AM  Advanced Directives  Does Patient Have a Medical Advance Directive? Yes Yes Yes Yes Yes Yes Yes  Type of Paramedic of [place];Living will [place];Living will [place];Living will [place];Living will [place];Living will [place]   Does patient want to make changes to medical advance directive? No - Patient declined  No - Patient declined   No - Patient declined   Copy of [place] in Chart? Yes - validated  most recent copy scanned in chart (See row information) Yes - validated most recent copy scanned in chart (See row information) No - copy requested Yes - validated most recent copy scanned in chart (See row information) No - copy requested No - copy requested     Current Medications (verified) Outpatient Encounter Medications as of 10/05/2021  Medication Sig   aspirin 81 MG tablet Take 1 tablet (81 mg total) by mouth daily. AM   Calcium Carbonate-Vit D-Min (CALCIUM 1200 PO) Take 1 tablet by mouth daily.   cholecalciferol (VITAMIN D3) 25 MCG (1000 UNIT) tablet Take 1,000 Units by mouth daily.   COVID-19 mRNA bivalent vaccine, Moderna, (MODERNA COVID-19 BIVAL BOOSTER) 50 MCG/0.5ML injection Inject into the muscle.   ezetimibe (ZETIA) 10 MG tablet Take 1 tablet (10 mg total) by mouth daily.   fluticasone (FLONASE) 50 MCG/ACT nasal spray Place 2 sprays into the nose daily. Dr A (Patient not taking: Reported on 06/14/1446)   folic acid ([employer] FOLIC ACID) 185 MCG tablet TAKE 1 TABLET DAILY. PT NEEDS TO TAKE 1 WHOLE PILL   hydrochlorothiazide (HYDRODIURIL) 25 MG tablet Take 1 tablet (25 mg total) by mouth daily.   ipratropium-albuterol (DUONEB) 0.5-2.5 (3) MG/3ML SOLN Inhale 3 mLs into the lungs 3 (three) times daily as needed. Dr A (Patient not taking: Reported on 09/21/2021)   losartan (COZAAR) 100 MG tablet Take 1 tablet (100  mg total) by mouth daily.   metoprolol succinate (TOPROL-XL) 50 MG 24 hr tablet Take 1 tablet (50 mg total) by mouth daily. TAKE WITH OR IMMEDIATELY FOLLOWING A MEAL.   Multiple Vitamins-Minerals (MULTIVITAMIN WITH MINERALS) tablet Take 1 tablet by mouth daily.   nitroGLYCERIN (NITROSTAT) 0.4 MG SL tablet Place 1 tablet (0.4 mg total) under the tongue every 5 (five) minutes as needed for chest pain.   omeprazole (PRILOSEC) 40 MG capsule TAKE 1 CAPSULE (40 MG TOTAL) BY MOUTH DAILY.   triamcinolone cream (KENALOG) 0.1 % Apply 1 Application topically 2 (two) times daily.   VENTOLIN HFA  108 (90 Base) MCG/ACT inhaler Dr A   No facility-administered encounter medications on file as of 10/05/2021.    Allergies (verified) Latex and Pravastatin sodium   History: Past Medical History:  Diagnosis Date   Anemia    Anemia due to folic acid deficiency 9/98/3382   Arthritis    WRIST AND FINGERS   Benign neoplasm of ascending colon    Benign neoplasm of descending colon    Benign neoplasm of sigmoid colon    COPD (chronic obstructive pulmonary disease) ([place])    Esophageal dysphagia    Gastroesophageal reflux disease 07/11/2017   Heart burn    Hyperlipidemia    Hypertension    CONTROLLED WITH MEDS   Past Surgical History:  Procedure Laterality Date   COLONOSCOPY WITH PROPOFOL N/A 11/22/2014   Procedure: COLONOSCOPY WITH PROPOFOL;  Surgeon: Lucilla Lame, MD;  Location: [facility];  Service: Endoscopy;  Laterality: N/A;  LATEX ALLERGY   COLONOSCOPY WITH PROPOFOL N/A 12/14/2019   Procedure: COLONOSCOPY WITH PROPOFOL;  Surgeon: Lucilla Lame, MD;  Location: [facility];  Service: Endoscopy;  Laterality: N/A;  priority 4   ECTOPIC PREGNANCY SURGERY     ESOPHAGOGASTRODUODENOSCOPY (EGD) WITH PROPOFOL N/A 01/26/2017   Procedure: ESOPHAGOGASTRODUODENOSCOPY (EGD) WITH PROPOFOL;  Surgeon: Lin Landsman, MD;  Location: [facility];  Service: Endoscopy;  Laterality: N/A;   ESOPHAGOGASTRODUODENOSCOPY (EGD) WITH PROPOFOL N/A 01/22/2021   Procedure: ESOPHAGOGASTRODUODENOSCOPY (EGD) WITH PROPOFOL;  Surgeon: Lin Landsman, MD;  Location: [facility];  Service: Endoscopy;  Laterality: N/A;   POLYPECTOMY  12/14/2019   Procedure: POLYPECTOMY;  Surgeon: Lucilla Lame, MD;  Location: [facility];  Service: Endoscopy;;   Family History  Problem Relation Age of Onset   Diabetes Mother    Hypertension Mother    Breast cancer Sister 92   Breast cancer Cousin        2 mat cousins   Social History   Socioeconomic History   Marital status: Married     Spouse name: Not on file   Number of children: 2   Years of education: Not on file   Highest education level: Associate degree: academic program  Occupational History   Not on file  Tobacco Use   Smoking status: Former    Packs/day: 0.50    Years: 18.00    Total pack years: 9.00    Types: Cigarettes   Smokeless tobacco: Never   Tobacco comments:    Pt states quit less than 15 years ago in 2023  Vaping Use   Vaping Use: Never used  Substance and Sexual Activity   Alcohol use: No    Alcohol/week: 0.0 standard drinks of alcohol   Drug use: No   Sexual activity: Never  Other Topics Concern   Not on file  Social History Narrative   Not on file   Social Determinants of  Health   Financial Resource Strain: Low Risk  (10/05/2021)   Overall Financial Resource Strain (CARDIA)    Difficulty of Paying Living Expenses: Not hard at all  Food Insecurity: No Food Insecurity (10/05/2021)   Hunger Vital Sign    Worried About Running Out of Food in the Last Year: Never true    Ran Out of Food in the Last Year: Never true  Transportation Needs: No Transportation Needs (10/05/2021)   PRAPARE - Hydrologist (Medical): No    Lack of Transportation (Non-Medical): No  Physical Activity: Insufficiently Active (10/05/2021)   Exercise Vital Sign    Days of Exercise per Week: 2 days    Minutes of Exercise per Session: 20 min  Stress: No Stress Concern Present (10/01/2020)   [place]    Feeling of Stress : Not at all  Social Connections: Moderately Integrated (10/05/2021)   Social Connection and Isolation Panel [NHANES]    Frequency of Communication with Friends and Family: More than three times a week    Frequency of Social Gatherings with Friends and Family: Twice a week    Attends Religious Services: More than 4 times per year    Active Member of [employer] or Organizations: No    Attends Theatre manager Meetings: Never    Marital Status: Married    Tobacco Counseling Counseling given: Not Answered Tobacco comments: Pt states quit less than 15 years ago in 2023       Diabetic?No         Activities of Daily Living    10/05/2021   10:49 AM 01/22/2021    8:29 AM  In your present state of health, do you have any difficulty performing the following activities:  Hearing? 0 0  Vision? 1 0  Difficulty concentrating or making decisions? 0 0  Walking or climbing stairs? 1   Comment needs inhaler if very active   Dressing or bathing? 0 0  Doing errands, shopping? 0     Patient Care Team: Juline Patch, MD as PCP - General (Family Medicine)  Indicate any recent Medical Services you may have received from other than [facility] providers in the past year (date may be approximate).     Assessment:   This is a routine wellness examination for Rikia.  Hearing/Vision screen No results found.  Dietary issues and exercise activities discussed:     Goals Addressed   None    Depression Screen    03/23/2021   10:36 AM 11/12/2020   10:52 AM 10/01/2020   10:13 AM 09/18/2020   11:09 AM 02/11/2020    2:02 PM 09/19/2019   10:30 AM 09/12/2019   10:19 AM  PHQ 2/9 Scores  PHQ - 2 Score 0 0 0 0 0 0 0  PHQ- 9 Score 0 0  0 0 0     Fall Risk    10/05/2021   10:47 AM 11/12/2020   10:52 AM 10/01/2020   10:18 AM 09/18/2020   11:04 AM 02/11/2020    2:01 PM  Fall Risk   Falls in the past year? 0 0 0 0 0  Number falls in past yr: 0 0 0 0   Injury with Fall? 0 0 0 0   Risk for fall due to :  No Fall Risks No Fall Risks No Fall Risks   Follow up  Falls evaluation completed Falls prevention discussed Falls evaluation completed Falls evaluation  completed    FALL RISK PREVENTION PERTAINING TO THE HOME:  Any stairs in or around the home? No  If so, are there any without handrails?  N/a Home free of loose throw rugs in walkways, pet beds, electrical cords, etc? No  Adequate lighting  in your home to reduce risk of falls? Yes      Cognitive Function:        10/05/2021   10:45 AM  6CIT Screen  What Year? 0 points  What month? 0 points  What time? 0 points  Count back from 20 0 points  Months in reverse 0 points  Repeat phrase 2 points  Total Score 2 points    Immunizations Immunization History  Administered Date(s) Administered   Fluad Quad(high Dose 65+) 12/25/2018, 01/01/2020, 02/09/2021   Influenza, High Dose Seasonal PF 11/01/2016, 01/11/2018   Moderna Covid-19 Vaccine Bivalent Booster 4yr & up 04/07/2021   Moderna SARS-COV2 Booster Vaccination 02/12/2020   Moderna Sars-Covid-2 Vaccination 04/03/2019   Pneumococcal Conjugate-13 05/03/2016   Pneumococcal Polysaccharide-23 07/11/2017   Tdap 05/03/2016    TDAP status: Up to date  Flu Vaccine status: Up to date  Pneumococcal vaccine status: Up to date  Covid-19 vaccine status: Information provided on how to obtain vaccines.   Qualifies for Shingles Vaccine? Yes   Zostavax completed No   Shingrix Completed?: No.    Education has been provided regarding the importance of this vaccine. Patient has been advised to call insurance company to determine out of pocket expense if they have not yet received this vaccine. Advised may also receive vaccine at local pharmacy or Health Dept. Verbalized acceptance and understanding.  Screening Tests Health Maintenance  Topic Date Due   COVID-19 Vaccine (4 - Booster for Moderna series) 06/02/2021   Zoster Vaccines- Shingrix (1 of 2) 12/22/2021 (Originally 05/15/1969)   INFLUENZA VACCINE  10/06/2021   MAMMOGRAM  10/29/2021   TETANUS/TDAP  05/03/2026   COLONOSCOPY (Pts 45-485yrInsurance coverage will need to be confirmed)  12/14/2026   Pneumonia Vaccine 6526Years old  Completed   DEXA SCAN  Completed   Hepatitis C Screening  Completed   HPV VACCINES  Aged Out    Health Maintenance  Health Maintenance Due  Topic Date Due   COVID-19 Vaccine (4 - Booster  for Moderna series) 06/02/2021    Colorectal cancer screening: Type of screening: Colonoscopy. Completed 2021. Repeat every 7 years  Mammogram status: Completed 2023. Repeat every year. Pt has been contacted/received letter to get mammogram scheduled in August  Dexa Scan completed 10/29/20  Lung Cancer Screening: (Low Dose CT Chest recommended if Age 72-80ears, 30 pack-year currently smoking OR have quit w/in 15years.) does qualify.   Lung Cancer Screening Referral: [facility]  Hepatitis C Screening: does qualify; Completed 2018  Vision Screening: Recommended annual ophthalmology exams for early detection of glaucoma and other disorders of the eye. Is the patient up to date with their annual eye exam?  No  She will make appt   Dental Screening: Recommended annual dental exams for proper oral hygiene  Community Resource Referral / Chronic Care Management: CRR required this visit?  No   CCM required this visit?  No      Plan:     Assessment & Plan   Annual wellness visit done today including the all of the following: Reviewed patient's Family Medical History Reviewed and updated list of patient's medical providers Assessment of cognitive impairment  was done Assessed patient's functional ability Established a written schedule for health screening [place] Completed and Reviewed   I have personally reviewed and noted the following in the patient's chart:   Medical and social history Use of alcohol, tobacco or illicit drugs  Current medications and supplements including opioid prescriptions.  Functional ability and status Nutritional status Physical activity Advanced directives List of other physicians Hospitalizations, surgeries, and ER visits in previous 12 months Vitals Screenings to include cognitive, depression, and falls Referrals and appointments  In addition, I have  reviewed and discussed with patient certain preventive protocols, quality metrics, and best practice recommendations. A written personalized care plan for preventive services as well as general preventive health recommendations were provided to patient.     Carvel Getting, NP   10/05/2021   Nurse Notes:  Pt to schedule mammogram for august Is considering shingles vaccine. Would like to get at [facility] clinic Is overdue for eye exam - pt will schedule appt.  Feels her COPD inhalers are not best - preferred prior meds. Will reach out to pulmonologist for appt to discuss.   I spent 30 minutes with patient for this annual wellness visit

## 2021-10-05 NOTE — Patient Instructions (Addendum)
It was lovely talking with you today! Please schedule your mammogram and schedule an appointment to have your vision checked. Talk with your pulmonologist about your COPD inhalers. I will inquire at [facility] about where you can get a shingles vaccine.    Health Maintenance, Female Adopting a healthy lifestyle and getting preventive care are important in promoting health and wellness. Ask your health care provider about: The right schedule for you to have regular tests and exams. Things you can do on your own to prevent diseases and keep yourself healthy. What should I know about diet, weight, and exercise? Eat a healthy diet  Eat a diet that includes plenty of vegetables, fruits, low-fat dairy products, and lean protein. Do not eat a lot of foods that are high in solid fats, added sugars, or sodium. Maintain a healthy weight Body mass index (BMI) is used to identify weight problems. It estimates body fat based on height and weight. Your health care provider can help determine your BMI and help you achieve or maintain a healthy weight. Get regular exercise Get regular exercise. This is one of the most important things you can do for your health. Most adults should: Exercise for at least 150 minutes each week. The exercise should increase your heart rate and make you sweat (moderate-intensity exercise). Do strengthening exercises at least twice a week. This is in addition to the moderate-intensity exercise. Spend less time sitting. Even light physical activity can be beneficial. Watch cholesterol and blood lipids Have your blood tested for lipids and cholesterol at 72 years of age, then have this test every 5 years. Have your cholesterol levels checked more often if: Your lipid or cholesterol levels are high. You are older than 72 years of age. You are at high risk for heart disease. What should I know about cancer screening? Depending on your health history and family history,  you may need to have cancer screening at various ages. This may include screening for: Breast cancer. Cervical cancer. Colorectal cancer. Skin cancer. Lung cancer. What should I know about heart disease, diabetes, and high blood pressure? Blood pressure and heart disease High blood pressure causes heart disease and increases the risk of stroke. This is more likely to develop in people who have high blood pressure readings or are overweight. Have your blood pressure checked: Every 3-5 years if you are 45-62 years of age. Every year if you are 56 years old or older. Diabetes Have regular diabetes screenings. This checks your fasting blood sugar level. Have the screening done: Once every three years after age 59 if you are at a normal weight and have a low risk for diabetes. More often and at a younger age if you are overweight or have a high risk for diabetes. What should I know about preventing infection? Hepatitis B If you have a higher risk for hepatitis B, you should be screened for this virus. Talk with your health care provider to find out if you are at risk for hepatitis B infection. Hepatitis C Testing is recommended for: Everyone born from [birth year] through 1966. Anyone with known risk factors for hepatitis C. Sexually transmitted infections (STIs) Get screened for STIs, including gonorrhea and chlamydia, if: You are sexually active and are younger than 72 years of age. You are older than 72 years of age and your health care provider tells you that you are at risk for this type of infection. Your sexual activity has changed since you were last screened, and  you are at increased risk for chlamydia or gonorrhea. Ask your health care provider if you are at risk. Ask your health care provider about whether you are at high risk for HIV. Your health care provider may recommend a prescription medicine to help prevent HIV infection. If you choose to take medicine to prevent HIV, you should  first get tested for HIV. You should then be tested every 3 months for as long as you are taking the medicine. Pregnancy If you are about to stop having your period (premenopausal) and you may become pregnant, seek counseling before you get pregnant. Take 400 to 800 micrograms (mcg) of folic acid every day if you become pregnant. Ask for birth control (contraception) if you want to prevent pregnancy. Osteoporosis and menopause Osteoporosis is a disease in which the bones lose minerals and strength with aging. This can result in bone fractures. If you are 45 years old or older, or if you are at risk for osteoporosis and fractures, ask your health care provider if you should: Be screened for bone loss. Take a calcium or vitamin D supplement to lower your risk of fractures. Be given hormone replacement therapy (HRT) to treat symptoms of menopause. Follow these instructions at home: Alcohol use Do not drink alcohol if: Your health care provider tells you not to drink. You are pregnant, may be pregnant, or are planning to become pregnant. If you drink alcohol: Limit how much you have to: 0-1 drink a day. Know how much alcohol is in your drink. In the [place] one drink equals one 12 oz bottle of beer (355 mL), one 5 oz glass of wine (148 mL), or one 1 oz glass of hard liquor (44 mL). Lifestyle Do not use any products that contain nicotine or tobacco. These products include cigarettes, chewing tobacco, and vaping devices, such as e-cigarettes. If you need help quitting, ask your health care provider. Do not use street drugs. Do not share needles. Ask your health care provider for help if you need support or information about quitting drugs. General instructions Schedule regular health, dental, and eye exams. Stay current with your vaccines. Tell your health care provider if: You often feel depressed. You have ever been abused or do not feel safe at home. Summary Adopting a healthy lifestyle  and getting preventive care are important in promoting health and wellness. Follow your health care provider's instructions about healthy diet, exercising, and getting tested or screened for diseases. Follow your health care provider's instructions on monitoring your cholesterol and blood pressure. This information is not intended to replace advice given to you by your health care provider. Make sure you discuss any questions you have with your health care provider. Document Revised: 07/14/2020 Document Reviewed: 07/14/2020 Elsevier Patient Education  [place].

## 2021-10-13 ENCOUNTER — Telehealth: Payer: Self-pay | Admitting: Family Medicine

## 2021-10-13 NOTE — Telephone Encounter (Signed)
Patient needs a shingles vaccine. Is she due for one?

## 2021-11-10 ENCOUNTER — Ambulatory Visit: Payer: Self-pay

## 2021-11-10 NOTE — Telephone Encounter (Signed)
  Chief Complaint: COVID Symptoms: coughing, chills, runny nose, headaches, and congestion Frequency: 2-3 days  Pertinent Negatives: Patient denies fever or SOB Disposition: '[]'$ ED /'[]'$ Urgent Care (no appt availability in office) / '[x]'$ Appointment(In office/virtual)/ '[]'$  [facility]/ '[]'$ Home Care/ '[]'$ Refused Recommended Disposition /'[]'$ [facility]/ '[]'$  Follow-up with PCP Additional Notes: pt had customer come in shop on Thursday and then called her on Friday to tell her she had tested positive. Pt did test on Friday and then went to [facility] Saturday and then another home test yesterday and all came back positive. Pt is taking Coricidin HBP and Aleve. Pt states she feels like symptoms are managed ok at home but wanting to go back to work asap. Pt scheduled telephone visit on 11/11/21 at 1120 with Dr. Ronnald Ramp. Refused appt for today with Dr. Army Melia.  Summary: COVID advice   Pt is calling to report that she tested positive for COVID on 11/06/21. Sx include coughing, chills, coughing, headaches, and congestion. Wanting some advice.      Reason for Disposition  [1] HIGH RISK patient (e.g., weak immune system, age > 73 years, obesity with BMI 30 or higher, pregnant, chronic lung disease or other chronic medical condition) AND [2] COVID symptoms (e.g., cough, fever)  (Exceptions: Already seen by PCP and no new or worsening symptoms.)  Answer Assessment - Initial Assessment Questions 1. COVID-19 DIAGNOSIS: "How do you know that you have COVID?" (e.g., positive lab test or self-test, diagnosed by doctor or NP/PA, symptoms after exposure).     Home test 11/06/21 and [facility] Saturday  2. COVID-19 EXPOSURE: "Was there any known exposure to COVID before the symptoms began?" CDC Definition of close contact: within 6 feet (2 meters) for a total of 15 minutes or more over a 24-hour period.      Had customer come in the shop on Thursday who was positive  3. ONSET: "When did the COVID-19 symptoms  start?"      Saturday  5. COUGH: "Do you have a cough?" If Yes, ask: "How bad is the cough?"       Yes 6. FEVER: "Do you have a fever?" If Yes, ask: "What is your temperature, how was it measured, and when did it start?"     no 7. RESPIRATORY STATUS: "Describe your breathing?" (e.g., normal; shortness of breath, wheezing, unable to speak)      Normal  9. OTHER SYMPTOMS: "Do you have any other symptoms?"  (e.g., chills, fatigue, headache, loss of smell or taste, muscle pain, sore throat)     Headache, cold chills, body aches, runny nose  10. HIGH RISK DISEASE: "Do you have any chronic medical problems?" (e.g., asthma, heart or lung disease, weak immune system, obesity, etc.)       COPD,  11. VACCINE: "Have you had the COVID-19 vaccine?" If Yes, ask: "Which one, how many shots, when did you get it?"       Yes vaccines and boosters 4 total  Protocols used: Coronavirus (COVID-19) Diagnosed or Suspected-A-AH

## 2021-11-11 ENCOUNTER — Ambulatory Visit (INDEPENDENT_AMBULATORY_CARE_PROVIDER_SITE_OTHER): Payer: Medicare HMO | Admitting: Family Medicine

## 2021-11-11 DIAGNOSIS — U071 COVID-19: Secondary | ICD-10-CM

## 2021-11-11 MED ORDER — BENZONATATE 100 MG PO CAPS: 100.0000 mg | ORAL_CAPSULE | Freq: Three times a day (TID) | ORAL | 0 refills | Status: DC | PRN

## 2021-11-11 NOTE — Progress Notes (Signed)
Date:  11/11/2021   Name:  Taylor Griffin   DOB:  January 04, 1951   MRN:  [id number]   Chief Complaint: Covid Positive (Symptoms started Saturday- positive test same day. Cough, sore throat and chills. Tried coricidin otc and Aleve. No fever. Cough with clear production)  I Otilio Miu from my officeconnected with this patient, Anjulie Dipierro, by telephone at the patient's home.  I verified that I am speaking with the correct person using two identifiers. This visit was conducted via telephone due to the Covid-19 outbreak from my office at [facility] in [place], [place]. I discussed the limitations, risks, security and privacy concerns of performing an evaluation and management service by telephone. I also discussed with the patient that there may be a patient responsible charge related to this service. The patient expressed understanding and agreed to proceed.      Lab Results  Component Value Date   NA 142 09/21/2021   K 3.8 09/21/2021   CO2 26 09/21/2021   GLUCOSE 101 (H) 09/21/2021   BUN 20 09/21/2021   CREATININE 1.22 (H) 09/21/2021   CALCIUM 10.1 09/21/2021   EGFR 47 (L) 09/21/2021   GFRNONAA 40 (L) 02/11/2020   Lab Results  Component Value Date   CHOL 201 (H) 03/23/2021   HDL 44 03/23/2021   LDLCALC 127 (H) 03/23/2021   TRIG 168 (H) 03/23/2021   CHOLHDL 5.2 (H) 07/11/2017   No results found for: "TSH" No results found for: "HGBA1C" Lab Results  Component Value Date   WBC 4.6 03/23/2021   HGB 14.8 03/23/2021   HCT 44.1 03/23/2021   MCV 93 03/23/2021   PLT 335 03/23/2021   Lab Results  Component Value Date   ALT 13 09/21/2021   AST 21 09/21/2021   ALKPHOS 82 09/21/2021   BILITOT 0.9 09/21/2021   No results found for: "25OHVITD2", "25OHVITD3", "VD25OH"   Review of Systems  Constitutional:  Negative for chills and fever.  HENT:  Negative for rhinorrhea, sneezing and sore throat.   Respiratory:  Positive for cough. Negative for chest tightness, wheezing  and stridor.     Patient Active Problem List   Diagnosis Date Noted   Spondylolysis of cervical spine 10/01/2020   Centrilobular emphysema ([place]) 08/21/2018   Dyshidrotic eczema 07/11/2017   Esophageal dysphagia    Hyperlipidemia, unspecified 05/03/2016   Hypertension 05/03/2016    Allergies  Allergen Reactions   Latex Itching   Pravastatin Sodium     Past Surgical History:  Procedure Laterality Date   COLONOSCOPY WITH PROPOFOL N/A 11/22/2014   Procedure: COLONOSCOPY WITH PROPOFOL;  Surgeon: Lucilla Lame, MD;  Location: [facility];  Service: Endoscopy;  Laterality: N/A;  LATEX ALLERGY   COLONOSCOPY WITH PROPOFOL N/A 12/14/2019   Procedure: COLONOSCOPY WITH PROPOFOL;  Surgeon: Lucilla Lame, MD;  Location: [facility];  Service: Endoscopy;  Laterality: N/A;  priority 4   ECTOPIC PREGNANCY SURGERY     ESOPHAGOGASTRODUODENOSCOPY (EGD) WITH PROPOFOL N/A 01/26/2017   Procedure: ESOPHAGOGASTRODUODENOSCOPY (EGD) WITH PROPOFOL;  Surgeon: Lin Landsman, MD;  Location: [facility];  Service: Endoscopy;  Laterality: N/A;   ESOPHAGOGASTRODUODENOSCOPY (EGD) WITH PROPOFOL N/A 01/22/2021   Procedure: ESOPHAGOGASTRODUODENOSCOPY (EGD) WITH PROPOFOL;  Surgeon: Lin Landsman, MD;  Location: [facility];  Service: Endoscopy;  Laterality: N/A;   POLYPECTOMY  12/14/2019   Procedure: POLYPECTOMY;  Surgeon: Lucilla Lame, MD;  Location: [facility];  Service: Endoscopy;;    Social History   Tobacco Use  Smoking status: Former    Packs/day: 0.50    Years: 18.00    Total pack years: 9.00    Types: Cigarettes   Smokeless tobacco: Never   Tobacco comments:    Pt states quit less than 15 years ago in 2023  Vaping Use   Vaping Use: Never used  Substance Use Topics   Alcohol use: No    Alcohol/week: 0.0 standard drinks of alcohol   Drug use: No     Medication list has been reviewed and updated.  Current Meds  Medication Sig   aspirin 81 MG  tablet Take 1 tablet (81 mg total) by mouth daily. AM   Calcium Carbonate-Vit D-Min (CALCIUM 1200 PO) Take 1 tablet by mouth daily.   cholecalciferol (VITAMIN D3) 25 MCG (1000 UNIT) tablet Take 1,000 Units by mouth daily.   COVID-19 mRNA bivalent vaccine, Moderna, (MODERNA COVID-19 BIVAL BOOSTER) 50 MCG/0.5ML injection Inject into the muscle.   ezetimibe (ZETIA) 10 MG tablet Take 1 tablet (10 mg total) by mouth daily.   fluticasone (FLONASE) 50 MCG/ACT nasal spray Place 2 sprays into the nose daily. Dr A   folic acid ([employer] FOLIC ACID) 500 MCG tablet TAKE 1 TABLET DAILY. PT NEEDS TO TAKE 1 WHOLE PILL   hydrochlorothiazide (HYDRODIURIL) 25 MG tablet Take 1 tablet (25 mg total) by mouth daily.   ipratropium-albuterol (DUONEB) 0.5-2.5 (3) MG/3ML SOLN Inhale 3 mLs into the lungs 3 (three) times daily as needed. Dr A   losartan (COZAAR) 100 MG tablet Take 1 tablet (100 mg total) by mouth daily.   metoprolol succinate (TOPROL-XL) 50 MG 24 hr tablet Take 1 tablet (50 mg total) by mouth daily. TAKE WITH OR IMMEDIATELY FOLLOWING A MEAL.   Multiple Vitamins-Minerals (MULTIVITAMIN WITH MINERALS) tablet Take 1 tablet by mouth daily.   nitroGLYCERIN (NITROSTAT) 0.4 MG SL tablet Place 1 tablet (0.4 mg total) under the tongue every 5 (five) minutes as needed for chest pain.   omeprazole (PRILOSEC) 40 MG capsule TAKE 1 CAPSULE (40 MG TOTAL) BY MOUTH DAILY.   triamcinolone cream (KENALOG) 0.1 % Apply 1 Application topically 2 (two) times daily.   VENTOLIN HFA 108 (90 Base) MCG/ACT inhaler every 6 (six) hours as needed. Dr A       11/11/2021   12:21 PM 03/23/2021   10:36 AM 09/18/2020   11:09 AM 02/11/2020    2:02 PM  GAD 7 : Generalized Anxiety Score  Nervous, Anxious, on Edge 0 0 0 0  Control/stop worrying 0 0 0 0  Worry too much - different things 0 0 0 0  Trouble relaxing 0 0 0 0  Restless 0 0 0 0  Easily annoyed or irritable 0 0 0 0  Afraid - awful might happen 0 0 0 0  Total GAD 7 Score 0 0 0 0   Anxiety Difficulty Not difficult at all Not difficult at all         11/11/2021   12:20 PM 03/23/2021   10:36 AM 11/12/2020   10:52 AM  Depression screen PHQ 2/9  Decreased Interest 0 0 0  Down, Depressed, Hopeless 0 0 0  PHQ - 2 Score 0 0 0  Altered sleeping 0 0 0  Tired, decreased energy 0 0 0  Change in appetite 0 0 0  Feeling bad or failure about yourself  0 0 0  Trouble concentrating 0 0 0  Moving slowly or fidgety/restless 0 0 0  Suicidal thoughts 0 0 0  PHQ-9 Score  0 0 0  Difficult doing work/chores Not difficult at all Not difficult at all Not difficult at all    BP Readings from Last 3 Encounters:  09/21/21 130/70  03/23/21 120/70  01/22/21 110/71    Physical Exam  Wt Readings from Last 3 Encounters:  09/21/21 158 lb (71.7 kg)  03/23/21 161 lb (73 kg)  01/22/21 163 lb (73.9 kg)    There were no vitals taken for this visit.  Assessment and Plan:  1. COVID New onset.  Patient tested positive on Saturday 2.  Gradually improving with only the persistence of a nonproductive cough without fever without chills without wheezing without shortness of breath without sore throat without ear pain.  Patient's been instructed to use Mucinex DM and we called in 20 Tessalon Perles 100 mg 1 every 8-12 hours as needed cough.  Patient's been instructed that she needed to isolate for 5 days including the rest of today after which she may return to work when she is no longer symptomatic and that she may be required to do a repeat COVID test and demonstrated is negative.  She has symptoms continue in the next 24 to 48 hours we will need to recheck patient in a clinical setting.  I spent 10 minutes with this patient, More than 50% of that time was spent in face to face education, counseling and care coordination.  Otilio Miu, MD

## 2021-12-14 ENCOUNTER — Telehealth: Payer: Self-pay | Admitting: Family Medicine

## 2021-12-14 NOTE — Telephone Encounter (Signed)
Copied from [place] [phone]. Topic: General - Other >> Dec 14, 2021 11:26 AM Cyndi Bender wrote: Reason for CRM: Pt husband requests that Baxter Flattery call pt back asap. No other details were provided. Cb# [phone]

## 2021-12-22 ENCOUNTER — Other Ambulatory Visit: Payer: Self-pay | Admitting: Family Medicine

## 2021-12-22 DIAGNOSIS — Z1231 Encounter for screening mammogram for malignant neoplasm of breast: Secondary | ICD-10-CM

## 2022-01-15 ENCOUNTER — Ambulatory Visit
Admission: RE | Admit: 2022-01-15 | Discharge: 2022-01-15 | Disposition: A | Discharge status: Home / Self Care | Payer: Medicare HMO | Source: Ambulatory Visit | Attending: Family Medicine | Admitting: Family Medicine

## 2022-01-15 DIAGNOSIS — Z1231 Encounter for screening mammogram for malignant neoplasm of breast: Secondary | ICD-10-CM | POA: Diagnosis not present

## 2022-01-18 ENCOUNTER — Ambulatory Visit (INDEPENDENT_AMBULATORY_CARE_PROVIDER_SITE_OTHER): Payer: Medicare HMO

## 2022-01-18 DIAGNOSIS — Z23 Encounter for immunization: Secondary | ICD-10-CM

## 2022-01-20 DIAGNOSIS — Z8249 Family history of ischemic heart disease and other diseases of the circulatory system: Secondary | ICD-10-CM | POA: Diagnosis not present

## 2022-01-20 DIAGNOSIS — R32 Unspecified urinary incontinence: Secondary | ICD-10-CM | POA: Diagnosis not present

## 2022-01-20 DIAGNOSIS — I25119 Atherosclerotic heart disease of native coronary artery with unspecified angina pectoris: Secondary | ICD-10-CM | POA: Diagnosis not present

## 2022-01-20 DIAGNOSIS — Z791 Long term (current) use of non-steroidal anti-inflammatories (NSAID): Secondary | ICD-10-CM | POA: Diagnosis not present

## 2022-01-20 DIAGNOSIS — J309 Allergic rhinitis, unspecified: Secondary | ICD-10-CM | POA: Diagnosis not present

## 2022-01-20 DIAGNOSIS — Z823 Family history of stroke: Secondary | ICD-10-CM | POA: Diagnosis not present

## 2022-01-20 DIAGNOSIS — L309 Dermatitis, unspecified: Secondary | ICD-10-CM | POA: Diagnosis not present

## 2022-01-20 DIAGNOSIS — J449 Chronic obstructive pulmonary disease, unspecified: Secondary | ICD-10-CM | POA: Diagnosis not present

## 2022-01-20 DIAGNOSIS — M8580 Other specified disorders of bone density and structure, unspecified site: Secondary | ICD-10-CM | POA: Diagnosis not present

## 2022-01-20 DIAGNOSIS — I10 Essential (primary) hypertension: Secondary | ICD-10-CM | POA: Diagnosis not present

## 2022-01-20 DIAGNOSIS — E785 Hyperlipidemia, unspecified: Secondary | ICD-10-CM | POA: Diagnosis not present

## 2022-01-20 DIAGNOSIS — M1990 Unspecified osteoarthritis, unspecified site: Secondary | ICD-10-CM | POA: Diagnosis not present

## 2022-01-28 ENCOUNTER — Other Ambulatory Visit: Payer: Self-pay

## 2022-01-28 ENCOUNTER — Emergency Department (HOSPITAL_COMMUNITY): Payer: Medicare HMO

## 2022-01-28 ENCOUNTER — Emergency Department (HOSPITAL_COMMUNITY)
Admission: EM | Admit: 2022-01-28 | Discharge: 2022-01-28 | Disposition: A | Discharge status: Home / Self Care | Payer: Medicare HMO | Attending: Emergency Medicine | Admitting: Emergency Medicine

## 2022-01-28 DIAGNOSIS — R42 Dizziness and giddiness: Secondary | ICD-10-CM | POA: Diagnosis not present

## 2022-01-28 DIAGNOSIS — Z7982 Long term (current) use of aspirin: Secondary | ICD-10-CM | POA: Diagnosis not present

## 2022-01-28 DIAGNOSIS — Z7951 Long term (current) use of inhaled steroids: Secondary | ICD-10-CM | POA: Diagnosis not present

## 2022-01-28 DIAGNOSIS — R569 Unspecified convulsions: Secondary | ICD-10-CM | POA: Insufficient documentation

## 2022-01-28 DIAGNOSIS — Z743 Need for continuous supervision: Secondary | ICD-10-CM | POA: Diagnosis not present

## 2022-01-28 DIAGNOSIS — R55 Syncope and collapse: Secondary | ICD-10-CM | POA: Insufficient documentation

## 2022-01-28 DIAGNOSIS — N289 Disorder of kidney and ureter, unspecified: Secondary | ICD-10-CM | POA: Insufficient documentation

## 2022-01-28 DIAGNOSIS — I10 Essential (primary) hypertension: Secondary | ICD-10-CM | POA: Diagnosis not present

## 2022-01-28 DIAGNOSIS — J449 Chronic obstructive pulmonary disease, unspecified: Secondary | ICD-10-CM | POA: Insufficient documentation

## 2022-01-28 DIAGNOSIS — I959 Hypotension, unspecified: Secondary | ICD-10-CM | POA: Diagnosis not present

## 2022-01-28 DIAGNOSIS — Z79899 Other long term (current) drug therapy: Secondary | ICD-10-CM | POA: Insufficient documentation

## 2022-01-28 DIAGNOSIS — R410 Disorientation, unspecified: Secondary | ICD-10-CM | POA: Diagnosis not present

## 2022-01-28 DIAGNOSIS — R0689 Other abnormalities of breathing: Secondary | ICD-10-CM | POA: Diagnosis not present

## 2022-01-28 LAB — CBC WITH DIFFERENTIAL/PLATELET
Abs Immature Granulocytes: 0.11 10*3/uL — ABNORMAL HIGH (ref 0.00–0.07)
Basophils Absolute: 0.1 10*3/uL (ref 0.0–0.1)
Basophils Relative: 0 %
Eosinophils Absolute: 0 10*3/uL (ref 0.0–0.5)
Eosinophils Relative: 0 %
HCT: 43.7 % (ref 36.0–46.0)
Hemoglobin: 14.6 g/dL (ref 12.0–15.0)
Immature Granulocytes: 1 %
Lymphocytes Relative: 6 %
Lymphs Abs: 1 10*3/uL (ref 0.7–4.0)
MCH: 31.4 pg (ref 26.0–34.0)
MCHC: 33.4 g/dL (ref 30.0–36.0)
MCV: 94 fL (ref 80.0–100.0)
Monocytes Absolute: 0.8 10*3/uL (ref 0.1–1.0)
Monocytes Relative: 5 %
Neutro Abs: 15.8 10*3/uL — ABNORMAL HIGH (ref 1.7–7.7)
Neutrophils Relative %: 88 %
Platelets: 323 10*3/uL (ref 150–400)
RBC: 4.65 MIL/uL (ref 3.87–5.11)
RDW: 13.8 % (ref 11.5–15.5)
WBC: 17.8 10*3/uL — ABNORMAL HIGH (ref 4.0–10.5)
nRBC: 0 % (ref 0.0–0.2)

## 2022-01-28 LAB — COMPREHENSIVE METABOLIC PANEL
ALT: 20 U/L (ref 0–44)
AST: 29 U/L (ref 15–41)
Albumin: 4 g/dL (ref 3.5–5.0)
Alkaline Phosphatase: 61 U/L (ref 38–126)
Anion gap: 16 — ABNORMAL HIGH (ref 5–15)
BUN: 23 mg/dL (ref 8–23)
CO2: 26 mmol/L (ref 22–32)
Calcium: 9.9 mg/dL (ref 8.9–10.3)
Chloride: 99 mmol/L (ref 98–111)
Creatinine, Ser: 1.59 mg/dL — ABNORMAL HIGH (ref 0.44–1.00)
GFR, Estimated: 34 mL/min — ABNORMAL LOW (ref >60)
Glucose, Bld: 84 mg/dL (ref 70–99)
Potassium: 3.4 mmol/L — ABNORMAL LOW (ref 3.5–5.1)
Sodium: 141 mmol/L (ref 135–145)
Total Bilirubin: 1.6 mg/dL — ABNORMAL HIGH (ref 0.3–1.2)
Total Protein: 7.2 g/dL (ref 6.5–8.1)

## 2022-01-28 LAB — MAGNESIUM: Magnesium: 2.1 mg/dL (ref 1.7–2.4)

## 2022-01-28 LAB — CBG MONITORING, ED
Glucose-Capillary: 197 mg/dL — ABNORMAL HIGH (ref 70–99)
Glucose-Capillary: 157 mg/dL — ABNORMAL HIGH (ref 70–99)

## 2022-01-28 LAB — TROPONIN I (HIGH SENSITIVITY): Troponin I (High Sensitivity): 11 ng/L (ref <18)

## 2022-01-28 MED ORDER — SODIUM CHLORIDE 0.9 % IV SOLN: INTRAVENOUS | Status: AC

## 2022-01-28 NOTE — ED Notes (Signed)
RN redrew light green top and sent to lab

## 2022-01-28 NOTE — Discharge Instructions (Addendum)
Please call for an appointment with [facility] or the neurologist of your choice.  Your testing today was reassuring other than showing that you had some change in your kidneys.  It does appear that you have some mild kidney dysfunction going back for quite some time however this does need to be followed up with your family doctor.  Nothing needs to happen today except making sure that you are drinking lots of fluids over the next couple of days.  Unfortunately when you have a seizure you will need to follow-up with a neurologist before you are cleared to drive.  This is a medical legal issue and you cannot drive legally until you are cleared by a neurologist.  If you have another seizure you are to return to the emergency department immediately, and at that time you would need to be started on seizure medications however at this time you do not need any seizure medicines

## 2022-01-28 NOTE — ED Provider Notes (Signed)
[facility] EMERGENCY DEPARTMENT Provider Note   CSN: [id number] Arrival date & time: 01/28/22  1603     History  Chief Complaint  Patient presents with   Near Syncope    Taylor Griffin is a 71 y.o. female.   Near Syncope   This patient is a 71 year old female, Taylor Griffin has a history of hypertension for which Taylor Griffin takes hydrochlorothiazide and losartan as well as metoprolol.  Taylor Griffin also takes albuterol for history of COPD.  Taylor Griffin presents to the hospital after having a near syncopal episode that occurred while Taylor Griffin was at home.  Taylor Griffin was actually in line to get lunch at the Thanksgiving day dinner when Taylor Griffin started to feel like something was wrong.  Taylor Griffin went to sit down at the table and was witnessed to be unresponsive, her arm was shaking, her lips were twitching off to 1 side and Taylor Griffin was staring straight ahead.  This lasted for 2 to 3 minutes and then spontaneously stopped during which time the patient was not her normal self.  Paramedics were called and within 10 minutes Taylor Griffin was able to stand up and ambulate to the EMS stretcher and talk at her baseline.  Taylor Griffin states that all Taylor Griffin has had to eat today was a glass of orange juice with her morning medications.  Taylor Griffin denies having any diarrhea, dysuria, chest pain, palpitations, coughing, shortness of breath and Taylor Griffin does not have any headaches.  Taylor Griffin does note some intermittent blurred vision over the last several months which Taylor Griffin cannot explain in which Taylor Griffin is not experiencing at this time.    Home Medications Prior to Admission medications   Medication Sig Start Date End Date Taking? Authorizing Provider  aspirin 81 MG tablet Take 1 tablet (81 mg total) by mouth daily. AM 05/03/16   Juline Patch, MD  benzonatate (TESSALON PERLES) 100 MG capsule Take 1 capsule (100 mg total) by mouth 3 (three) times daily as needed for cough. 11/11/21   Juline Patch, MD  Calcium Carbonate-Vit D-Min (CALCIUM 1200 PO) Take 1 tablet by mouth daily.     [provider]  cholecalciferol (VITAMIN D3) 25 MCG (1000 UNIT) tablet Take 1,000 Units by mouth daily.    [provider]  COVID-19 mRNA bivalent vaccine, Moderna, (MODERNA COVID-19 BIVAL BOOSTER) 50 MCG/0.5ML injection Inject into the muscle. 04/07/21   Carlyle Basques, MD  ezetimibe (ZETIA) 10 MG tablet Take 1 tablet (10 mg total) by mouth daily. 09/21/21   Juline Patch, MD  fluticasone (FLONASE) 50 MCG/ACT nasal spray Place 2 sprays into the nose daily. Dr A 01/16/18 11/11/21  [provider]  folic acid ([employer]S FOLIC ACID) 546 MCG tablet TAKE 1 TABLET DAILY. PT NEEDS TO TAKE 1 WHOLE PILL 09/21/21   Juline Patch, MD  hydrochlorothiazide (HYDRODIURIL) 25 MG tablet Take 1 tablet (25 mg total) by mouth daily. 09/21/21   Juline Patch, MD  ipratropium-albuterol (DUONEB) 0.5-2.5 (3) MG/3ML SOLN Inhale 3 mLs into the lungs 3 (three) times daily as needed. Dr A 03/20/18 11/11/21  [provider]  losartan (COZAAR) 100 MG tablet Take 1 tablet (100 mg total) by mouth daily. 09/21/21   Juline Patch, MD  metoprolol succinate (TOPROL-XL) 50 MG 24 hr tablet Take 1 tablet (50 mg total) by mouth daily. TAKE WITH OR IMMEDIATELY FOLLOWING A MEAL. 09/21/21   Juline Patch, MD  Multiple Vitamins-Minerals (MULTIVITAMIN WITH MINERALS) tablet Take 1 tablet by mouth daily.  [provider]  nitroGLYCERIN (NITROSTAT) 0.4 MG SL tablet Place 1 tablet (0.4 mg total) under the tongue every 5 (five) minutes as needed for chest pain. 11/12/20   Juline Patch, MD  omeprazole (PRILOSEC) 40 MG capsule TAKE 1 CAPSULE (40 MG TOTAL) BY MOUTH DAILY. 03/03/21   Lin Landsman, MD  triamcinolone cream (KENALOG) 0.1 % Apply 1 Application topically 2 (two) times daily. 09/21/21   Juline Patch, MD  VENTOLIN HFA 108 (90 Base) MCG/ACT inhaler every 6 (six) hours as needed. Dr A 01/12/21   [provider]      Allergies    Latex and Pravastatin sodium    Review of Systems    Review of Systems  Cardiovascular:  Positive for near-syncope.  All other systems reviewed and are negative.   Physical Exam Updated Vital Signs BP 120/70   Pulse 85   Temp 98.7 F (37.1 C) (Oral)   Resp 20   Ht 1.676 m ('5\' 6"'$ )   Wt 72.6 kg   SpO2 94%   BMI 25.82 kg/m  Physical Exam Vitals and nursing note reviewed.  Constitutional:      General: Taylor Griffin is not in acute distress.    Appearance: Taylor Griffin is well-developed.  HENT:     Head: Normocephalic and atraumatic.     Nose: No congestion or rhinorrhea.     Mouth/Throat:     Mouth: Mucous membranes are moist.     Pharynx: No oropharyngeal exudate.  Eyes:     General: No scleral icterus.       Right eye: No discharge.        Left eye: No discharge.     Conjunctiva/sclera: Conjunctivae normal.     Pupils: Pupils are equal, round, and reactive to light.  Neck:     Thyroid: No thyromegaly.     Vascular: No JVD.  Cardiovascular:     Rate and Rhythm: Normal rate and regular rhythm.     Heart sounds: Normal heart sounds. No murmur heard.    No friction rub. No gallop.  Pulmonary:     Effort: Pulmonary effort is normal. No respiratory distress.     Breath sounds: Normal breath sounds. No wheezing or rales.  Abdominal:     General: Bowel sounds are normal. There is no distension.     Palpations: Abdomen is soft. There is no mass.     Tenderness: There is no abdominal tenderness.  Musculoskeletal:        General: No tenderness. Normal range of motion.     Cervical back: Normal range of motion and neck supple.     Right lower leg: No edema.     Left lower leg: No edema.  Lymphadenopathy:     Cervical: No cervical adenopathy.  Skin:    General: Skin is warm and dry.     Findings: No erythema or rash.  Neurological:     General: No focal deficit present.     Mental Status: Taylor Griffin is alert.     Coordination: Coordination normal.     Comments: Normal speech, normal coordination, normal cranial nerves III through XII, normal  strength and sensation in all 4 extremities.  Normal level of alertness  Psychiatric:        Behavior: Behavior normal.     ED Results / Procedures / Treatments   Labs (all labs ordered are listed, but only abnormal results are displayed) Labs Reviewed  CBC WITH DIFFERENTIAL/PLATELET - Abnormal; Notable for the  following components:      Result Value   WBC 17.8 (*)    Neutro Abs 15.8 (*)    Abs Immature Granulocytes 0.11 (*)    All other components within normal limits  COMPREHENSIVE METABOLIC PANEL - Abnormal; Notable for the following components:   Potassium 3.4 (*)    Creatinine, Ser 1.59 (*)    Total Bilirubin 1.6 (*)    GFR, Estimated 34 (*)    Anion gap 16 (*)    All other components within normal limits  CBG MONITORING, ED - Abnormal; Notable for the following components:   Glucose-Capillary 197 (*)    All other components within normal limits  CBG MONITORING, ED - Abnormal; Notable for the following components:   Glucose-Capillary 157 (*)    All other components within normal limits  MAGNESIUM  TROPONIN I (HIGH SENSITIVITY)    EKG EKG Interpretation  Date/Time:  Thursday January 28 2022 16:12:18 EST Ventricular Rate:  87 PR Interval:    QRS Duration: 85 QT Interval:  368 QTC Calculation: 443 R Axis:   30 Text Interpretation: Normal sinus rhythm Borderline repolarization abnormality Since last tracing rate faster Confirmed by Noemi Chapel [phone]) on 01/28/2022 4:29:48 PM  Radiology CT Head Wo Contrast  Result Date: 01/28/2022 CLINICAL DATA:  New onset seizure EXAM: CT HEAD WITHOUT CONTRAST TECHNIQUE: Contiguous axial images were obtained from the base of the skull through the vertex without intravenous contrast. RADIATION DOSE REDUCTION: This exam was performed according to the departmental dose-optimization program which includes automated exposure control, adjustment of the mA and/or kV according to patient size and/or use of iterative reconstruction  technique. COMPARISON:  None Available. FINDINGS: Brain: No evidence of acute infarction, hemorrhage, hydrocephalus, extra-axial collection or mass lesion/mass effect. Vascular: No hyperdense vessel or unexpected calcification. Skull: Normal. Negative for fracture or focal lesion. Sinuses/Orbits: No acute finding. Other: None. IMPRESSION: No acute intracranial pathology. Electronically Signed   By: Delanna Ahmadi M.D.   On: 01/28/2022 17:36    Procedures Procedures    Medications Ordered in ED Medications  0.9 %  sodium chloride infusion ( Intravenous New Bag/Given 01/28/22 1642)    ED Course/ Medical Decision Making/ A&P Clinical Course as of 01/28/22 2035  Thu Jan 28, 2022  1719 Discussed with Neuro - Dr. Leonel Ramsay agrees with close f/u and only starting meds if CT shows causation - driving restrictions - outpt w/u hopefully [BM]    Clinical Course User Index [BM] Noemi Chapel, MD                           Medical Decision Making Amount and/or Complexity of Data Reviewed Labs: ordered. Radiology: ordered.  Risk Prescription drug management.   This patient presents to the ED for concern of syncopal episode and possible seizure activity, this involves an extensive number of treatment options, and is a complaint that carries with it a high risk of complications and morbidity.  The differential diagnosis includes seizure, syncope, hypoglycemia   Co morbidities that complicate the patient evaluation  hypertension   Additional history obtained:  Additional history obtained from family at the bedside External records from outside source obtained and reviewed including multiple office visits for treatment of hypertension   Lab Tests:  I Ordered, and personally interpreted labs.  The pertinent results include: Mild renal insufficiency compared to prior labs, only a slight increase in creatinine   Imaging Studies ordered:  I ordered imaging studies including  CT scan of the  head I independently visualized and interpreted imaging which showed no acute findings I agree with the radiologist interpretation   Cardiac Monitoring: / EKG:  The patient was maintained on a cardiac monitor.  I personally viewed and interpreted the cardiac monitored which showed an underlying rhythm of: Normal sinus rhythm   Consultations Obtained:  I requested consultation with the neurologist, Dr. Leonel Ramsay,  and discussed lab and imaging findings as well as pertinent plan - they recommend: Seizure precautions, follow-up in the office, no need to start seizure medications unless this happens again.  I discussed this with the family and they are in agreement   Problem List / ED Course / Critical interventions / Medication management  Seizures likely, vital signs normal, no recurrent symptoms while in the ED, slight increase in creatinine, patient taking IV fluids and given an IV fluid infusion here. I ordered medication including IV fluids for dehydration Reevaluation of the patient after these medicines showed that the patient improved I have reviewed the patients home medicines and have made adjustments as needed   Social Determinants of Health:  None   Test / Admission - Considered:  Considered admission but the patient has not had any further seizures or the need for further work-up, patient stable         Final Clinical Impression(s) / ED Diagnoses Final diagnoses:  Syncope and collapse  Seizure-like activity ([place])  Renal insufficiency     Noemi Chapel, MD 01/28/22 2035

## 2022-01-28 NOTE — ED Triage Notes (Signed)
Pt BIB EMS due to near syncopal event. Pt did not have LOC, witnessed by family, pt had incontinence. Initial pressure with EMS was 80/60.

## 2022-01-29 ENCOUNTER — Ambulatory Visit: Payer: Self-pay

## 2022-01-29 NOTE — Telephone Encounter (Signed)
Summary: discuss hospital visit   Pt requesting a cb to discuss 11-23 hospital visit and hospital MD recommendations     Answer Assessment - Initial Assessment Questions 1. REASON FOR CALL or QUESTION: "What is your reason for calling today?" or "How can I best help you?" or "What question do you have that I can help answer?"     Doesn't the hospital provider schedule specialist appts.  Protocols used: Information Only Call - No Triage-A-AH

## 2022-01-29 NOTE — Telephone Encounter (Signed)
  Chief Complaint: Follow up Symptoms: currently none Frequency:  Pertinent Negatives: Patient denies current s/s Disposition: '[]'$ ED /'[]'$ Urgent Care (no appt availability in office) / '[]'$ Appointment(In office/virtual)/ '[]'$  [place] Virtual Care/ '[]'$ Home Care/ '[]'$ Refused Recommended Disposition /'[]'$  Mobile Bus/ '[x]'$  Follow-up with PCP Additional Notes: Pt was sen at ED Thursday. Pt had many tests done and was advised to follow up with PCP and neurologist. Pt was also advised that there may be a kidney issue. Pt would like to be seen Monday the 27th for follow up.  No appts available - please advise.

## 2022-02-01 ENCOUNTER — Telehealth: Payer: Self-pay

## 2022-02-01 NOTE — Telephone Encounter (Signed)
Transition Care Management Follow-up Telephone Call Date of discharge and from where: 01/28/2022 [facility] How have you been since you were released from the hospital? Been feeling okay, no more spells Any questions or concerns? No  Items Reviewed: Did the pt receive and understand the discharge instructions provided? Yes  Medications obtained and verified? yes Other? No  Any new allergies since your discharge? No  Dietary orders reviewed? Yes Do you have support at home? Yes   Home Care and Equipment/Supplies: no  Functional Questionnaire: (I = Independent and D = Dependent) ADLs: I  Bathing/Dressing- I  Meal Prep- I  Eating- I  Maintaining continence- I  Transferring/Ambulation- I  Managing Meds- I  Follow up appointments reviewed:  PCP Hospital f/u appt confirmed? Yes  Scheduled to see Dr Otilio Miu on 02/08/22 @ 320. [facility] f/u appt confirmed?  Scheduled to see [place]/ neuro in [place] on 03/16/21 @ 815  is on cancellation list Are transportation arrangements needed? No  If their condition worsens, is the pt aware to call PCP or go to the Emergency Dept.? Yes Was the patient provided with contact information for the PCP's office or ED? Yes Was to pt encouraged to call back with questions or concerns? Yes

## 2022-02-03 DIAGNOSIS — R55 Syncope and collapse: Secondary | ICD-10-CM | POA: Diagnosis not present

## 2022-02-03 DIAGNOSIS — R569 Unspecified convulsions: Secondary | ICD-10-CM | POA: Diagnosis not present

## 2022-02-03 DIAGNOSIS — J449 Chronic obstructive pulmonary disease, unspecified: Secondary | ICD-10-CM | POA: Diagnosis not present

## 2022-02-03 DIAGNOSIS — Z7689 Persons encountering health services in other specified circumstances: Secondary | ICD-10-CM | POA: Diagnosis not present

## 2022-02-03 DIAGNOSIS — E860 Dehydration: Secondary | ICD-10-CM | POA: Diagnosis not present

## 2022-02-03 DIAGNOSIS — R0602 Shortness of breath: Secondary | ICD-10-CM | POA: Diagnosis not present

## 2022-02-04 DIAGNOSIS — H16223 Keratoconjunctivitis sicca, not specified as Sjogren's, bilateral: Secondary | ICD-10-CM | POA: Diagnosis not present

## 2022-02-04 DIAGNOSIS — H2513 Age-related nuclear cataract, bilateral: Secondary | ICD-10-CM | POA: Diagnosis not present

## 2022-02-08 ENCOUNTER — Encounter: Payer: Self-pay | Admitting: Family Medicine

## 2022-02-08 ENCOUNTER — Ambulatory Visit (INDEPENDENT_AMBULATORY_CARE_PROVIDER_SITE_OTHER): Payer: Medicare HMO | Admitting: Family Medicine

## 2022-02-08 VITALS — BP 120/70 | HR 71 | Ht 66.0 in | Wt 159.0 lb

## 2022-02-08 DIAGNOSIS — R55 Syncope and collapse: Secondary | ICD-10-CM

## 2022-02-08 DIAGNOSIS — L821 Other seborrheic keratosis: Secondary | ICD-10-CM | POA: Diagnosis not present

## 2022-02-08 NOTE — Progress Notes (Signed)
Date:  02/08/2022   Name:  Taylor Griffin   DOB:  11-25-1950   MRN:  [id number]   Chief Complaint: Hospitalization Follow-up (In ER 11/23 and call placed on 11/27- has already seen [facility] neuro this past Wednesday/ scheduled for EEG in January)  Loss of Consciousness The current episode started 1 to 4 weeks ago. Episode frequency: episode. Associated symptoms include bladder incontinence. Pertinent negatives include no abdominal pain, bowel incontinence, chest pain, dizziness, focal sensory loss, focal weakness, headaches, light-headedness, palpitations or slurred speech. Associated symptoms comments: pale.    Lab Results  Component Value Date   NA 141 01/28/2022   K 3.4 (L) 01/28/2022   CO2 26 01/28/2022   GLUCOSE 84 01/28/2022   BUN 23 01/28/2022   CREATININE 1.59 (H) 01/28/2022   CALCIUM 9.9 01/28/2022   EGFR 47 (L) 09/21/2021   GFRNONAA 34 (L) 01/28/2022   Lab Results  Component Value Date   CHOL 201 (H) 03/23/2021   HDL 44 03/23/2021   LDLCALC 127 (H) 03/23/2021   TRIG 168 (H) 03/23/2021   CHOLHDL 5.2 (H) 07/11/2017   No results found for: "TSH" No results found for: "HGBA1C" Lab Results  Component Value Date   WBC 17.8 (H) 01/28/2022   HGB 14.6 01/28/2022   HCT 43.7 01/28/2022   MCV 94.0 01/28/2022   PLT 323 01/28/2022   Lab Results  Component Value Date   ALT 20 01/28/2022   AST 29 01/28/2022   ALKPHOS 61 01/28/2022   BILITOT 1.6 (H) 01/28/2022   No results found for: "25OHVITD2", "25OHVITD3", "VD25OH"   Review of Systems  Constitutional:  Negative for fatigue and unexpected weight change.  HENT:  Negative for congestion.   Eyes:  Negative for visual disturbance.  Respiratory:  Negative for chest tightness, shortness of breath and wheezing.   Cardiovascular:  Positive for syncope. Negative for chest pain, palpitations and leg swelling.  Gastrointestinal:  Negative for abdominal pain and bowel incontinence.  Endocrine: Negative for polydipsia and  polyuria.  Genitourinary:  Positive for bladder incontinence.  Neurological:  Negative for dizziness, focal weakness, light-headedness and headaches.  Hematological:  Negative for adenopathy. Does not bruise/bleed easily.    Patient Active Problem List   Diagnosis Date Noted   Spondylolysis of cervical spine 10/01/2020   Centrilobular emphysema ([place]) 08/21/2018   Dyshidrotic eczema 07/11/2017   Esophageal dysphagia    Hyperlipidemia, unspecified 05/03/2016   Hypertension 05/03/2016    Allergies  Allergen Reactions   Latex Itching   Pravastatin Sodium     Past Surgical History:  Procedure Laterality Date   COLONOSCOPY WITH PROPOFOL N/A 11/22/2014   Procedure: COLONOSCOPY WITH PROPOFOL;  Surgeon: Lucilla Lame, MD;  Location: [place];  Service: Endoscopy;  Laterality: N/A;  LATEX ALLERGY   COLONOSCOPY WITH PROPOFOL N/A 12/14/2019   Procedure: COLONOSCOPY WITH PROPOFOL;  Surgeon: Lucilla Lame, MD;  Location: [place];  Service: Endoscopy;  Laterality: N/A;  priority 4   ECTOPIC PREGNANCY SURGERY     ESOPHAGOGASTRODUODENOSCOPY (EGD) WITH PROPOFOL N/A 01/26/2017   Procedure: ESOPHAGOGASTRODUODENOSCOPY (EGD) WITH PROPOFOL;  Surgeon: Lin Landsman, MD;  Location: [place];  Service: Endoscopy;  Laterality: N/A;   ESOPHAGOGASTRODUODENOSCOPY (EGD) WITH PROPOFOL N/A 01/22/2021   Procedure: ESOPHAGOGASTRODUODENOSCOPY (EGD) WITH PROPOFOL;  Surgeon: Lin Landsman, MD;  Location: [place];  Service: Endoscopy;  Laterality: N/A;   POLYPECTOMY  12/14/2019   Procedure: POLYPECTOMY;  Surgeon: Lucilla Lame, MD;  Location: [place];  Service: Endoscopy;;    Social History   Tobacco Use   Smoking status: Former    Packs/day: 0.50    Years: 18.00    Total pack years: 9.00    Types: Cigarettes   Smokeless tobacco: Never   Tobacco comments:    Pt states quit less than 15 years ago in 2023  Vaping Use   Vaping Use: Never used   Substance Use Topics   Alcohol use: No    Alcohol/week: 0.0 standard drinks of alcohol   Drug use: No     Medication list has been reviewed and updated.  Current Meds  Medication Sig   aspirin 81 MG tablet Take 1 tablet (81 mg total) by mouth daily. AM   Calcium Carbonate-Vit D-Min (CALCIUM 1200 PO) Take 1 tablet by mouth daily.   cholecalciferol (VITAMIN D3) 25 MCG (1000 UNIT) tablet Take 1,000 Units by mouth daily.   ezetimibe (ZETIA) 10 MG tablet Take 1 tablet (10 mg total) by mouth daily.   fluticasone (FLONASE) 50 MCG/ACT nasal spray Place 2 sprays into the nose daily. Dr A   folic acid (CVS FOLIC ACID) 308 MCG tablet TAKE 1 TABLET DAILY. PT NEEDS TO TAKE 1 WHOLE PILL   hydrochlorothiazide (HYDRODIURIL) 25 MG tablet Take 1 tablet (25 mg total) by mouth daily.   ipratropium-albuterol (DUONEB) 0.5-2.5 (3) MG/3ML SOLN Inhale 3 mLs into the lungs 3 (three) times daily as needed. Dr A   losartan (COZAAR) 100 MG tablet Take 1 tablet (100 mg total) by mouth daily.   metoprolol succinate (TOPROL-XL) 50 MG 24 hr tablet Take 1 tablet (50 mg total) by mouth daily. TAKE WITH OR IMMEDIATELY FOLLOWING A MEAL.   Multiple Vitamins-Minerals (MULTIVITAMIN WITH MINERALS) tablet Take 1 tablet by mouth daily.   nitroGLYCERIN (NITROSTAT) 0.4 MG SL tablet Place 1 tablet (0.4 mg total) under the tongue every 5 (five) minutes as needed for chest pain.   omeprazole (PRILOSEC) 40 MG capsule TAKE 1 CAPSULE (40 MG TOTAL) BY MOUTH DAILY.   triamcinolone cream (KENALOG) 0.1 % Apply 1 Application topically 2 (two) times daily.   [DISCONTINUED] COVID-19 mRNA bivalent vaccine, Moderna, (MODERNA COVID-19 BIVAL BOOSTER) 50 MCG/0.5ML injection Inject into the muscle.   [DISCONTINUED] VENTOLIN HFA 108 (90 Base) MCG/ACT inhaler every 6 (six) hours as needed. Dr A       02/08/2022    3:42 PM 11/11/2021   12:21 PM 03/23/2021   10:36 AM 09/18/2020   11:09 AM  GAD 7 : Generalized Anxiety Score  Nervous, Anxious, on Edge  0 0 0 0  Control/stop worrying 0 0 0 0  Worry too much - different things 0 0 0 0  Trouble relaxing 0 0 0 0  Restless 0 0 0 0  Easily annoyed or irritable 0 0 0 0  Afraid - awful might happen 0 0 0 0  Total GAD 7 Score 0 0 0 0  Anxiety Difficulty Not difficult at all Not difficult at all Not difficult at all        02/08/2022    3:42 PM 11/11/2021   12:20 PM 03/23/2021   10:36 AM  Depression screen PHQ 2/9  Decreased Interest 0 0 0  Down, Depressed, Hopeless 0 0 0  PHQ - 2 Score 0 0 0  Altered sleeping 0 0 0  Tired, decreased energy 0 0 0  Change in appetite 0 0 0  Feeling bad or failure about yourself  0 0 0  Trouble concentrating 0  0 0  Moving slowly or fidgety/restless 0 0 0  Suicidal thoughts 0 0 0  PHQ-9 Score 0 0 0  Difficult doing work/chores Not difficult at all Not difficult at all Not difficult at all    BP Readings from Last 3 Encounters:  02/08/22 120/70  01/28/22 103/66  09/21/21 130/70    Physical Exam Vitals and nursing note reviewed. Exam conducted with a chaperone present.  Constitutional:      General: She is not in acute distress.    Appearance: She is not diaphoretic.  HENT:     Head: Normocephalic and atraumatic.     Right Ear: External ear normal.     Left Ear: External ear normal.     Nose: Nose normal.  Eyes:     General:        Right eye: No discharge.        Left eye: No discharge.     Conjunctiva/sclera: Conjunctivae normal.     Pupils: Pupils are equal, round, and reactive to light.  Neck:     Thyroid: No thyromegaly.     Vascular: No JVD.  Cardiovascular:     Rate and Rhythm: Normal rate and regular rhythm.     Heart sounds: Normal heart sounds. No murmur heard.    No friction rub. No gallop.  Pulmonary:     Effort: Pulmonary effort is normal.     Breath sounds: Normal breath sounds. No wheezing or rhonchi.  Abdominal:     General: Bowel sounds are normal.     Palpations: Abdomen is soft. There is no mass.     Tenderness:  There is no abdominal tenderness. There is no guarding.  Musculoskeletal:        General: Normal range of motion.     Cervical back: Normal range of motion and neck supple.  Lymphadenopathy:     Cervical: No cervical adenopathy.  Skin:    General: Skin is warm and dry.     Findings: Lesion present. No rash.       Neurological:     Mental Status: She is alert.     Deep Tendon Reflexes: Reflexes are normal and symmetric.     Wt Readings from Last 3 Encounters:  02/08/22 159 lb (72.1 kg)  01/28/22 160 lb (72.6 kg)  09/21/21 158 lb (71.7 kg)    BP 120/70   Pulse 71   Ht _0  (1.676 m)   Wt 159 lb (72.1 kg)   SpO2 97%   BMI 25.66 kg/m   Assessment and Plan:  1. Vasovagal syncope New onset.  Episodic with resolution.  Patient had gone almost the entire day with little intake either of hydration or food nature.  Mid afternoon she became suddenly ill with nausea diaphoresis dizziness with a syncopal episode that had some seizure-like features.  There was no residual focal abnormality afterwards and patient was evaluated in the emergency room with CT scan.  On review there was no acute intracranial pathology noted and patient has had a neurology consult for seizure-like Activity with Dr. Melrose Nakayama.  Patient I believe has an upcoming EEG for evaluation and likely of a vasovagal nature.  2. Seborrheic keratosis Patient also has a large seborrheic keratoses or papular area which has some features of a keratoses.  We will refer to dermatology for evaluation. - Ambulatory referral to Dermatology    Otilio Miu, MD

## 2022-03-09 ENCOUNTER — Other Ambulatory Visit: Payer: Self-pay | Admitting: Family Medicine

## 2022-03-09 DIAGNOSIS — I10 Essential (primary) hypertension: Secondary | ICD-10-CM

## 2022-03-15 DIAGNOSIS — L280 Lichen simplex chronicus: Secondary | ICD-10-CM | POA: Diagnosis not present

## 2022-03-15 DIAGNOSIS — L918 Other hypertrophic disorders of the skin: Secondary | ICD-10-CM | POA: Diagnosis not present

## 2022-03-15 DIAGNOSIS — L821 Other seborrheic keratosis: Secondary | ICD-10-CM | POA: Diagnosis not present

## 2022-03-16 ENCOUNTER — Ambulatory Visit: Payer: Medicare HMO | Admitting: Neurology

## 2022-03-25 ENCOUNTER — Ambulatory Visit (INDEPENDENT_AMBULATORY_CARE_PROVIDER_SITE_OTHER): Payer: Medicare HMO | Admitting: Family Medicine

## 2022-03-25 ENCOUNTER — Encounter: Payer: Self-pay | Admitting: Family Medicine

## 2022-03-25 VITALS — BP 120/76 | HR 74 | Ht 66.0 in | Wt 157.0 lb

## 2022-03-25 DIAGNOSIS — I10 Essential (primary) hypertension: Secondary | ICD-10-CM | POA: Diagnosis not present

## 2022-03-25 DIAGNOSIS — E785 Hyperlipidemia, unspecified: Secondary | ICD-10-CM

## 2022-03-25 MED ORDER — METOPROLOL SUCCINATE ER 50 MG PO TB24: 50.0000 mg | ORAL_TABLET | Freq: Every day | ORAL | 1 refills | Status: DC

## 2022-03-25 MED ORDER — EZETIMIBE 10 MG PO TABS: 10.0000 mg | ORAL_TABLET | Freq: Every day | ORAL | 1 refills | Status: DC

## 2022-03-25 MED ORDER — HYDROCHLOROTHIAZIDE 25 MG PO TABS: 25.0000 mg | ORAL_TABLET | Freq: Every day | ORAL | 1 refills | Status: DC

## 2022-03-25 MED ORDER — LOSARTAN POTASSIUM 100 MG PO TABS: 100.0000 mg | ORAL_TABLET | Freq: Every day | ORAL | 1 refills | Status: DC

## 2022-03-25 NOTE — Progress Notes (Signed)
Date:  03/25/2022   Name:  Taylor Griffin   DOB:  Apr 28, 1950   MRN:  [id number]   Chief Complaint: Hyperlipidemia and Hypertension  Hyperlipidemia This is a chronic problem. The current episode started more than 1 year ago. The problem is controlled. Recent lipid tests were reviewed and are normal. She has no history of chronic renal disease, diabetes, hypothyroidism, liver disease, obesity or nephrotic syndrome. There are no known factors aggravating her hyperlipidemia. Pertinent negatives include no chest pain, focal sensory loss, focal weakness, leg pain, myalgias or shortness of breath. Current antihyperlipidemic treatment includes ezetimibe. The current treatment provides moderate improvement of lipids. There are no compliance problems.  Risk factors for coronary artery disease include dyslipidemia and hypertension.  Hypertension This is a chronic problem. The current episode started more than 1 year ago. The problem has been gradually improving since onset. The problem is controlled. Pertinent negatives include no anxiety, blurred vision, chest pain, headaches, malaise/fatigue, neck pain, orthopnea, palpitations, peripheral edema, PND, shortness of breath or sweats. There are no associated agents to hypertension. Risk factors for coronary artery disease include dyslipidemia. Past treatments include diuretics, angiotensin blockers and beta blockers. The current treatment provides moderate improvement. There are no compliance problems.  There is no history of angina, kidney disease, CAD/MI, CVA, heart failure, left ventricular hypertrophy, PVD or retinopathy. There is no history of chronic renal disease, a hypertension causing med or renovascular disease.    Lab Results  Component Value Date   NA 141 01/28/2022   K 3.4 (L) 01/28/2022   CO2 26 01/28/2022   GLUCOSE 84 01/28/2022   BUN 23 01/28/2022   CREATININE 1.59 (H) 01/28/2022   CALCIUM 9.9 01/28/2022   EGFR 47 (L) 09/21/2021    GFRNONAA 34 (L) 01/28/2022   Lab Results  Component Value Date   CHOL 201 (H) 03/23/2021   HDL 44 03/23/2021   LDLCALC 127 (H) 03/23/2021   TRIG 168 (H) 03/23/2021   CHOLHDL 5.2 (H) 07/11/2017   No results found for: "TSH" No results found for: "HGBA1C" Lab Results  Component Value Date   WBC 17.8 (H) 01/28/2022   HGB 14.6 01/28/2022   HCT 43.7 01/28/2022   MCV 94.0 01/28/2022   PLT 323 01/28/2022   Lab Results  Component Value Date   ALT 20 01/28/2022   AST 29 01/28/2022   ALKPHOS 61 01/28/2022   BILITOT 1.6 (H) 01/28/2022   No results found for: "25OHVITD2", "25OHVITD3", "VD25OH"   Review of Systems  Constitutional:  Negative for chills, fever and malaise/fatigue.  HENT:  Negative for drooling, ear discharge, ear pain and sore throat.   Eyes:  Negative for blurred vision.  Respiratory:  Negative for cough, shortness of breath and wheezing.   Cardiovascular:  Negative for chest pain, palpitations, orthopnea, leg swelling and PND.  Gastrointestinal:  Negative for abdominal pain, blood in stool, constipation, diarrhea and nausea.  Endocrine: Negative for polydipsia.  Genitourinary:  Negative for dysuria, frequency, hematuria and urgency.  Musculoskeletal:  Negative for back pain, myalgias and neck pain.  Skin:  Negative for rash.  Allergic/Immunologic: Negative for environmental allergies.  Neurological:  Negative for dizziness, focal weakness and headaches.  Hematological:  Does not bruise/bleed easily.  Psychiatric/Behavioral:  Negative for suicidal ideas. The patient is not nervous/anxious.     Patient Active Problem List   Diagnosis Date Noted   Spondylolysis of cervical spine 10/01/2020   Centrilobular emphysema ([place]) 08/21/2018   Dyshidrotic eczema 07/11/2017  Esophageal dysphagia    Hyperlipidemia, unspecified 05/03/2016   Hypertension 05/03/2016    Allergies  Allergen Reactions   Latex Itching   Pravastatin Sodium     Past Surgical History:   Procedure Laterality Date   COLONOSCOPY WITH PROPOFOL N/A 11/22/2014   Procedure: COLONOSCOPY WITH PROPOFOL;  Surgeon: Lucilla Lame, MD;  Location: [place];  Service: Endoscopy;  Laterality: N/A;  LATEX ALLERGY   COLONOSCOPY WITH PROPOFOL N/A 12/14/2019   Procedure: COLONOSCOPY WITH PROPOFOL;  Surgeon: Lucilla Lame, MD;  Location: [place];  Service: Endoscopy;  Laterality: N/A;  priority 4   ECTOPIC PREGNANCY SURGERY     ESOPHAGOGASTRODUODENOSCOPY (EGD) WITH PROPOFOL N/A 01/26/2017   Procedure: ESOPHAGOGASTRODUODENOSCOPY (EGD) WITH PROPOFOL;  Surgeon: Lin Landsman, MD;  Location: [place];  Service: Endoscopy;  Laterality: N/A;   ESOPHAGOGASTRODUODENOSCOPY (EGD) WITH PROPOFOL N/A 01/22/2021   Procedure: ESOPHAGOGASTRODUODENOSCOPY (EGD) WITH PROPOFOL;  Surgeon: Lin Landsman, MD;  Location: [place];  Service: Endoscopy;  Laterality: N/A;   POLYPECTOMY  12/14/2019   Procedure: POLYPECTOMY;  Surgeon: Lucilla Lame, MD;  Location: [facility];  Service: Endoscopy;;    Social History   Tobacco Use   Smoking status: Former    Packs/day: 0.50    Years: 18.00    Total pack years: 9.00    Types: Cigarettes   Smokeless tobacco: Never   Tobacco comments:    Pt states quit less than 15 years ago in 2023  Vaping Use   Vaping Use: Never used  Substance Use Topics   Alcohol use: No    Alcohol/week: 0.0 standard drinks of alcohol   Drug use: No     Medication list has been reviewed and updated.  Current Meds  Medication Sig   aspirin 81 MG tablet Take 1 tablet (81 mg total) by mouth daily. AM   Calcium Carbonate-Vit D-Min (CALCIUM 1200 PO) Take 1 tablet by mouth daily.   cholecalciferol (VITAMIN D3) 25 MCG (1000 UNIT) tablet Take 1,000 Units by mouth daily.   ezetimibe (ZETIA) 10 MG tablet Take 1 tablet (10 mg total) by mouth daily.   fluticasone (FLONASE) 50 MCG/ACT nasal spray Place 2 sprays into the nose daily. Dr A    folic acid ([employer]S FOLIC ACID) 295 MCG tablet TAKE 1 TABLET DAILY. PT NEEDS TO TAKE 1 WHOLE PILL   Glycopyrrolate-Formoterol (BEVESPI AEROSPHERE) 9-4.8 MCG/ACT AERO 2 puffs 2 (two) times daily. Dr Lanney Gins   hydrochlorothiazide (HYDRODIURIL) 25 MG tablet Take 1 tablet (25 mg total) by mouth daily.   ipratropium-albuterol (DUONEB) 0.5-2.5 (3) MG/3ML SOLN Inhale 3 mLs into the lungs 3 (three) times daily as needed. Dr A   losartan (COZAAR) 100 MG tablet Take 1 tablet (100 mg total) by mouth daily.   metoprolol succinate (TOPROL-XL) 50 MG 24 hr tablet TAKE 1 TABLET BY MOUTH DAILY. TAKE WITH OR IMMEDIATELY FOLLOWING A MEAL.   Multiple Vitamins-Minerals (MULTIVITAMIN WITH MINERALS) tablet Take 1 tablet by mouth daily.   nitroGLYCERIN (NITROSTAT) 0.4 MG SL tablet Place 1 tablet (0.4 mg total) under the tongue every 5 (five) minutes as needed for chest pain.   omeprazole (PRILOSEC) 40 MG capsule TAKE 1 CAPSULE (40 MG TOTAL) BY MOUTH DAILY.   triamcinolone cream (KENALOG) 0.1 % Apply 1 Application topically 2 (two) times daily.       03/25/2022   10:36 AM 02/08/2022    3:42 PM 11/11/2021   12:21 PM 03/23/2021   10:36 AM  GAD 7 : Generalized  Anxiety Score  Nervous, Anxious, on Edge 0 0 0 0  Control/stop worrying 0 0 0 0  Worry too much - different things 0 0 0 0  Trouble relaxing 0 0 0 0  Restless 0 0 0 0  Easily annoyed or irritable 0 0 0 0  Afraid - awful might happen 0 0 0 0  Total GAD 7 Score 0 0 0 0  Anxiety Difficulty Not difficult at all Not difficult at all Not difficult at all Not difficult at all       03/25/2022   10:36 AM 02/08/2022    3:42 PM 11/11/2021   12:20 PM  Depression screen PHQ 2/9  Decreased Interest 0 0 0  Down, Depressed, Hopeless 0 0 0  PHQ - 2 Score 0 0 0  Altered sleeping 0 0 0  Tired, decreased energy 0 0 0  Change in appetite 0 0 0  Feeling bad or failure about yourself  0 0 0  Trouble concentrating 0 0 0  Moving slowly or fidgety/restless 0 0 0  Suicidal  thoughts 0 0 0  PHQ-9 Score 0 0 0  Difficult doing work/chores Not difficult at all Not difficult at all Not difficult at all    BP Readings from Last 3 Encounters:  03/25/22 120/76  02/08/22 120/70  01/28/22 103/66    Physical Exam Vitals and nursing note reviewed. Exam conducted with a chaperone present.  Constitutional:      General: She is not in acute distress.    Appearance: She is not diaphoretic.  HENT:     Head: Normocephalic and atraumatic.     Right Ear: Tympanic membrane and external ear normal.     Left Ear: Tympanic membrane and external ear normal.     Nose: Nose normal. No congestion or rhinorrhea.     Mouth/Throat:     Mouth: Mucous membranes are moist.  Eyes:     General:        Right eye: No discharge.        Left eye: No discharge.     Conjunctiva/sclera: Conjunctivae normal.     Pupils: Pupils are equal, round, and reactive to light.  Neck:     Thyroid: No thyromegaly.     Vascular: No JVD.  Cardiovascular:     Rate and Rhythm: Normal rate and regular rhythm.     Heart sounds: Normal heart sounds. No murmur heard.    No friction rub. No gallop.  Pulmonary:     Effort: Pulmonary effort is normal.     Breath sounds: Normal breath sounds. No wheezing, rhonchi or rales.  Abdominal:     General: Bowel sounds are normal.     Palpations: Abdomen is soft. There is no mass.     Tenderness: There is no abdominal tenderness. There is no guarding.  Musculoskeletal:        General: Normal range of motion.     Cervical back: Normal range of motion and neck supple.  Lymphadenopathy:     Cervical: No cervical adenopathy.  Skin:    General: Skin is warm and dry.  Neurological:     Mental Status: She is alert.     Wt Readings from Last 3 Encounters:  03/25/22 157 lb (71.2 kg)  02/08/22 159 lb (72.1 kg)  01/28/22 160 lb (72.6 kg)    BP 120/76   Pulse 74   Ht '5\' 6"'$  (1.676 m)   Wt 157 lb (71.2 kg)   SpO2  93%   BMI 25.34 kg/m   Assessment and  Plan: 1. Essential hypertension Chronic.  Controlled.  Stable.  Blood pressure today 120/76.  Asymptomatic.  Tolerating medications well.  Continue hydrochlorothiazide 25 mg once a day, losartan 100 mg once a day, and metoprolol XL 50 mg once a day.  Will check renal function panel for electrolytes and GFR since GFR was noted to be decreased on her past hospitalization when she was evaluated for seizures.. - hydrochlorothiazide (HYDRODIURIL) 25 MG tablet; Take 1 tablet (25 mg total) by mouth daily.  Dispense: 90 tablet; Refill: 1 - losartan (COZAAR) 100 MG tablet; Take 1 tablet (100 mg total) by mouth daily.  Dispense: 90 tablet; Refill: 1 - metoprolol succinate (TOPROL-XL) 50 MG 24 hr tablet; Take 1 tablet (50 mg total) by mouth daily. TAKE WITH OR IMMEDIATELY FOLLOWING A MEAL.  Dispense: 90 tablet; Refill: 1 - Renal Function Panel  2. Hyperlipidemia, unspecified hyperlipidemia type Chronic.  Controlled.  Stable.  Continue Zetia 10 mg once a day.  Will check lipid panel for current status of LDL. - ezetimibe (ZETIA) 10 MG tablet; Take 1 tablet (10 mg total) by mouth daily.  Dispense: 90 tablet; Refill: 1 - Lipid Panel With LDL/HDL Ratio     Otilio Miu, MD

## 2022-03-26 LAB — LIPID PANEL WITH LDL/HDL RATIO
Cholesterol, Total: 217 mg/dL — ABNORMAL HIGH (ref 100–199)
HDL: 49 mg/dL (ref >39)
LDL Chol Calc (NIH): 144 mg/dL — ABNORMAL HIGH (ref 0–99)
LDL/HDL Ratio: 2.9 ratio (ref 0.0–3.2)
Triglycerides: 131 mg/dL (ref 0–149)
VLDL Cholesterol Cal: 24 mg/dL (ref 5–40)

## 2022-03-26 LAB — RENAL FUNCTION PANEL
Albumin: 4.5 g/dL (ref 3.8–4.8)
BUN/Creatinine Ratio: 21 (ref 12–28)
BUN: 26 mg/dL (ref 8–27)
CO2: 26 mmol/L (ref 20–29)
Calcium: 10.2 mg/dL (ref 8.7–10.3)
Chloride: 100 mmol/L (ref 96–106)
Creatinine, Ser: 1.26 mg/dL — ABNORMAL HIGH (ref 0.57–1.00)
Glucose: 94 mg/dL (ref 70–99)
Phosphorus: 4.5 mg/dL — ABNORMAL HIGH (ref 3.0–4.3)
Potassium: 4.4 mmol/L (ref 3.5–5.2)
Sodium: 144 mmol/L (ref 134–144)
eGFR: 45 mL/min/{1.73_m2} — ABNORMAL LOW (ref >59)

## 2022-04-03 DIAGNOSIS — R569 Unspecified convulsions: Secondary | ICD-10-CM | POA: Diagnosis not present

## 2022-04-17 DIAGNOSIS — R569 Unspecified convulsions: Secondary | ICD-10-CM | POA: Diagnosis not present

## 2022-04-26 DIAGNOSIS — L280 Lichen simplex chronicus: Secondary | ICD-10-CM | POA: Diagnosis not present

## 2022-05-14 ENCOUNTER — Telehealth: Payer: Self-pay | Admitting: Family Medicine

## 2022-05-14 NOTE — Telephone Encounter (Signed)
Copied from [place] [id number]. Topic: General - Inquiry >> May 14, 2022  1:51 PM Penni Bombard wrote: Reason for CRM: pt is calling asking if her neurologist has sent there test results to the office   CB#  [phone]

## 2022-05-17 ENCOUNTER — Telehealth: Payer: Self-pay

## 2022-05-17 NOTE — Telephone Encounter (Signed)
Called neuro on pt's behalf and left message for Melrose Nakayama or his nurse to call pt and go over her EEG results with her. She called asking for results on Friday stating it has been over 2 weeks and I haven't heard anything.

## 2022-08-04 DIAGNOSIS — J449 Chronic obstructive pulmonary disease, unspecified: Secondary | ICD-10-CM | POA: Diagnosis not present

## 2022-09-18 ENCOUNTER — Other Ambulatory Visit: Payer: Self-pay | Admitting: Family Medicine

## 2022-09-18 DIAGNOSIS — E785 Hyperlipidemia, unspecified: Secondary | ICD-10-CM

## 2022-09-19 ENCOUNTER — Other Ambulatory Visit: Payer: Self-pay | Admitting: Family Medicine

## 2022-09-19 DIAGNOSIS — I10 Essential (primary) hypertension: Secondary | ICD-10-CM

## 2022-09-20 NOTE — Telephone Encounter (Signed)
Requested Prescriptions  Pending Prescriptions Disp Refills   ezetimibe (ZETIA) 10 MG tablet [Pharmacy Med Name: EZETIMIBE 10 MG TABLET] 90 tablet 0    Sig: TAKE 1 TABLET BY MOUTH EVERY DAY     Cardiovascular:  Antilipid - Sterol Transport Inhibitors Failed - 09/18/2022  9:15 AM      Failed - Lipid Panel in normal range within the last 12 months    Cholesterol, Total  Date Value Ref Range Status  03/25/2022 217 (H) 100 - 199 mg/dL Final   LDL Chol Calc (NIH)  Date Value Ref Range Status  03/25/2022 144 (H) 0 - 99 mg/dL Final   HDL  Date Value Ref Range Status  03/25/2022 49 >39 mg/dL Final   Triglycerides  Date Value Ref Range Status  03/25/2022 131 0 - 149 mg/dL Final         Passed - AST in normal range and within 360 days    AST  Date Value Ref Range Status  01/28/2022 29 15 - 41 U/L Final         Passed - ALT in normal range and within 360 days    ALT  Date Value Ref Range Status  01/28/2022 20 0 - 44 U/L Final         Passed - Patient is not pregnant      Passed - Valid encounter within last 12 months    Recent Outpatient Visits           5 months ago Essential hypertension   [facility]   7 months ago Vasovagal syncope   [place] [facility]   10 months ago COVID   [facility]   12 months ago Essential hypertension   [place] [facility]   1 year ago Essential hypertension   [place] [facility]       Future Appointments             In 1 week Duanne Limerick, MD [facility] at [facility], [facility]

## 2022-09-23 ENCOUNTER — Ambulatory Visit: Payer: Medicare HMO | Admitting: Family Medicine

## 2022-09-28 ENCOUNTER — Ambulatory Visit (INDEPENDENT_AMBULATORY_CARE_PROVIDER_SITE_OTHER): Payer: Medicare HMO | Admitting: Family Medicine

## 2022-09-28 ENCOUNTER — Encounter: Payer: Self-pay | Admitting: Family Medicine

## 2022-09-28 VITALS — BP 100/70 | HR 69 | Ht 66.0 in | Wt 159.0 lb

## 2022-09-28 DIAGNOSIS — M205X2 Other deformities of toe(s) (acquired), left foot: Secondary | ICD-10-CM

## 2022-09-28 DIAGNOSIS — L309 Dermatitis, unspecified: Secondary | ICD-10-CM | POA: Diagnosis not present

## 2022-09-28 DIAGNOSIS — E785 Hyperlipidemia, unspecified: Secondary | ICD-10-CM

## 2022-09-28 DIAGNOSIS — M205X1 Other deformities of toe(s) (acquired), right foot: Secondary | ICD-10-CM

## 2022-09-28 DIAGNOSIS — D529 Folate deficiency anemia, unspecified: Secondary | ICD-10-CM

## 2022-09-28 DIAGNOSIS — I10 Essential (primary) hypertension: Secondary | ICD-10-CM | POA: Diagnosis not present

## 2022-09-28 MED ORDER — EZETIMIBE 10 MG PO TABS: 10.0000 mg | ORAL_TABLET | Freq: Every day | ORAL | 1 refills | Status: DC

## 2022-09-28 MED ORDER — TRIAMCINOLONE ACETONIDE 0.1 % EX CREA: 1.0000 | TOPICAL_CREAM | Freq: Two times a day (BID) | CUTANEOUS | 0 refills | Status: DC

## 2022-09-28 MED ORDER — FOLIC ACID 800 MCG PO TABS: ORAL_TABLET | ORAL | 1 refills | Status: DC

## 2022-09-28 MED ORDER — LOSARTAN POTASSIUM 100 MG PO TABS: 100.0000 mg | ORAL_TABLET | Freq: Every day | ORAL | 1 refills | Status: DC

## 2022-09-28 MED ORDER — METOPROLOL SUCCINATE ER 50 MG PO TB24: 50.0000 mg | ORAL_TABLET | Freq: Every day | ORAL | 1 refills | Status: DC

## 2022-09-28 MED ORDER — HYDROCHLOROTHIAZIDE 25 MG PO TABS: 25.0000 mg | ORAL_TABLET | Freq: Every day | ORAL | 1 refills | Status: DC

## 2022-09-28 NOTE — Progress Notes (Signed)
Date:  09/28/2022   Name:  Taylor Griffin   DOB:  Dec 09, 1950   MRN:  [id number]   Chief Complaint: Hyperlipidemia, Hypertension, Anemia, and Rash (Has spots that come up on feet- uses triamcinolone cream on them)  Hyperlipidemia This is a chronic problem. The current episode started more than 1 year ago. The problem is controlled. Recent lipid tests were reviewed and are normal. She has no history of chronic renal disease, diabetes, hypothyroidism, liver disease, obesity or nephrotic syndrome. There are no known factors aggravating her hyperlipidemia. Pertinent negatives include no chest pain, focal sensory loss, focal weakness, leg pain, myalgias or shortness of breath. Current antihyperlipidemic treatment includes statins. The current treatment provides moderate improvement of lipids. There are no compliance problems.  Risk factors for coronary artery disease include dyslipidemia and hypertension.  Hypertension This is a chronic problem. The current episode started more than 1 year ago. The problem is controlled. Pertinent negatives include no blurred vision, chest pain, headaches, palpitations, PND or shortness of breath. There are no associated agents to hypertension. Past treatments include angiotensin blockers and beta blockers. The current treatment provides moderate improvement. There are no compliance problems.  There is no history of CAD/MI or CVA. There is no history of chronic renal disease, a hypertension causing med or renovascular disease.  Anemia Presents for follow-up visit. There has been no abdominal pain, bruising/bleeding easily, fever, light-headedness or palpitations. Signs of blood loss that are not present include hematemesis, hematochezia, melena and vaginal bleeding. There is no history of chronic renal disease or hypothyroidism. There are no compliance problems.   Rash This is a chronic problem. The problem has been gradually improving since onset. The affected  locations include the left lower leg and right lower leg. Pertinent negatives include no diarrhea, fatigue, fever or shortness of breath.    Lab Results  Component Value Date   NA 144 03/25/2022   K 4.4 03/25/2022   CO2 26 03/25/2022   GLUCOSE 94 03/25/2022   BUN 26 03/25/2022   CREATININE 1.26 (H) 03/25/2022   CALCIUM 10.2 03/25/2022   EGFR 45 (L) 03/25/2022   GFRNONAA 34 (L) 01/28/2022   Lab Results  Component Value Date   CHOL 217 (H) 03/25/2022   HDL 49 03/25/2022   LDLCALC 144 (H) 03/25/2022   TRIG 131 03/25/2022   CHOLHDL 5.2 (H) 07/11/2017   No results found for: "TSH" No results found for: "HGBA1C" Lab Results  Component Value Date   WBC 17.8 (H) 01/28/2022   HGB 14.6 01/28/2022   HCT 43.7 01/28/2022   MCV 94.0 01/28/2022   PLT 323 01/28/2022   Lab Results  Component Value Date   ALT 20 01/28/2022   AST 29 01/28/2022   ALKPHOS 61 01/28/2022   BILITOT 1.6 (H) 01/28/2022   No results found for: "25OHVITD2", "25OHVITD3", "VD25OH"   Review of Systems  Constitutional:  Positive for chills. Negative for fatigue and fever.  HENT:  Negative for trouble swallowing.   Eyes:  Negative for blurred vision.  Respiratory:  Negative for chest tightness, shortness of breath and wheezing.   Cardiovascular:  Negative for chest pain, palpitations, leg swelling and PND.  Gastrointestinal:  Negative for abdominal pain, blood in stool, constipation, diarrhea, hematemesis, hematochezia, melena and nausea.  Endocrine: Negative for polydipsia and polyuria.  Genitourinary:  Negative for difficulty urinating, frequency and vaginal bleeding.  Musculoskeletal:  Negative for back pain and myalgias.  Skin:  Positive for rash.  Neurological:  Negative  for focal weakness, light-headedness and headaches.  Hematological:  Negative for adenopathy. Does not bruise/bleed easily.    Patient Active Problem List   Diagnosis Date Noted   Spondylolysis of cervical spine 10/01/2020    Centrilobular emphysema (HCC) 08/21/2018   Dyshidrotic eczema 07/11/2017   Esophageal dysphagia    Hyperlipidemia, unspecified 05/03/2016   Hypertension 05/03/2016    Allergies  Allergen Reactions   Latex Itching   Pravastatin Sodium     Past Surgical History:  Procedure Laterality Date   COLONOSCOPY WITH PROPOFOL N/A 11/22/2014   Procedure: COLONOSCOPY WITH PROPOFOL;  Surgeon: Midge Minium, MD;  Location: [facility];  Service: Endoscopy;  Laterality: N/A;  LATEX ALLERGY   COLONOSCOPY WITH PROPOFOL N/A 12/14/2019   Procedure: COLONOSCOPY WITH PROPOFOL;  Surgeon: Midge Minium, MD;  Location: [facility];  Service: Endoscopy;  Laterality: N/A;  priority 4   ECTOPIC PREGNANCY SURGERY     ESOPHAGOGASTRODUODENOSCOPY (EGD) WITH PROPOFOL N/A 01/26/2017   Procedure: ESOPHAGOGASTRODUODENOSCOPY (EGD) WITH PROPOFOL;  Surgeon: Toney Reil, MD;  Location: [facility];  Service: Endoscopy;  Laterality: N/A;   ESOPHAGOGASTRODUODENOSCOPY (EGD) WITH PROPOFOL N/A 01/22/2021   Procedure: ESOPHAGOGASTRODUODENOSCOPY (EGD) WITH PROPOFOL;  Surgeon: Toney Reil, MD;  Location: [facility];  Service: Endoscopy;  Laterality: N/A;   POLYPECTOMY  12/14/2019   Procedure: POLYPECTOMY;  Surgeon: Midge Minium, MD;  Location: [facility];  Service: Endoscopy;;    Social History   Tobacco Use   Smoking status: Former    Current packs/day: 0.50    Average packs/day: 0.5 packs/day for 18.0 years (9.0 ttl pk-yrs)    Types: Cigarettes   Smokeless tobacco: Never   Tobacco comments:    Pt states quit less than 15 years ago in 2023  Vaping Use   Vaping status: Never Used  Substance Use Topics   Alcohol use: No    Alcohol/week: 0.0 standard drinks of alcohol   Drug use: No     Medication list has been reviewed and updated.  Current Meds  Medication Sig   aspirin 81 MG tablet Take 1 tablet (81 mg total) by mouth daily. AM   Calcium Carbonate-Vit D-Min  (CALCIUM 1200 PO) Take 1 tablet by mouth daily.   cholecalciferol (VITAMIN D3) 25 MCG (1000 UNIT) tablet Take 1,000 Units by mouth daily.   ezetimibe (ZETIA) 10 MG tablet TAKE 1 TABLET BY MOUTH EVERY DAY   fluticasone (FLONASE) 50 MCG/ACT nasal spray Place 2 sprays into the nose daily. Dr A   folic acid ([employer] FOLIC ACID) 800 MCG tablet TAKE 1 TABLET DAILY. PT NEEDS TO TAKE 1 WHOLE PILL   Glycopyrrolate-Formoterol (BEVESPI AEROSPHERE) 9-4.8 MCG/ACT AERO 2 puffs 2 (two) times daily. Dr Karna Christmas   hydrochlorothiazide (HYDRODIURIL) 25 MG tablet TAKE 1 TABLET (25 MG TOTAL) BY MOUTH DAILY.   ipratropium-albuterol (DUONEB) 0.5-2.5 (3) MG/3ML SOLN Inhale 3 mLs into the lungs 3 (three) times daily as needed. Dr A   losartan (COZAAR) 100 MG tablet Take 1 tablet (100 mg total) by mouth daily.   metoprolol succinate (TOPROL-XL) 50 MG 24 hr tablet Take 1 tablet (50 mg total) by mouth daily. TAKE WITH OR IMMEDIATELY FOLLOWING A MEAL.   Multiple Vitamins-Minerals (MULTIVITAMIN WITH MINERALS) tablet Take 1 tablet by mouth daily.   omeprazole (PRILOSEC) 40 MG capsule TAKE 1 CAPSULE (40 MG TOTAL) BY MOUTH DAILY.   triamcinolone cream (KENALOG) 0.1 % Apply 1 Application topically 2 (two) times daily.       09/28/2022  9:56 AM 03/25/2022   10:36 AM 02/08/2022    3:42 PM 11/11/2021   12:21 PM  GAD 7 : Generalized Anxiety Score  Nervous, Anxious, on Edge 0 0 0 0  Control/stop worrying 0 0 0 0  Worry too much - different things 0 0 0 0  Trouble relaxing 0 0 0 0  Restless 0 0 0 0  Easily annoyed or irritable 0 0 0 0  Afraid - awful might happen 0 0 0 0  Total GAD 7 Score 0 0 0 0  Anxiety Difficulty Not difficult at all Not difficult at all Not difficult at all Not difficult at all       09/28/2022    9:56 AM 03/25/2022   10:36 AM 02/08/2022    3:42 PM  Depression screen PHQ 2/9  Decreased Interest 0 0 0  Down, Depressed, Hopeless 0 0 0  PHQ - 2 Score 0 0 0  Altered sleeping 0 0 0  Tired, decreased  energy 0 0 0  Change in appetite 0 0 0  Feeling bad or failure about yourself  0 0 0  Trouble concentrating 0 0 0  Moving slowly or fidgety/restless 0 0 0  Suicidal thoughts 0 0 0  PHQ-9 Score 0 0 0  Difficult doing work/chores Not difficult at all Not difficult at all Not difficult at all    BP Readings from Last 3 Encounters:  09/28/22 100/70  03/25/22 120/76  02/08/22 120/70    Physical Exam Vitals and nursing note reviewed. Exam conducted with a chaperone present.  Constitutional:      General: She is not in acute distress.    Appearance: She is not diaphoretic.  HENT:     Head: Normocephalic and atraumatic.     Right Ear: Tympanic membrane, ear canal and external ear normal.     Left Ear: Tympanic membrane, ear canal and external ear normal.     Nose: Nose normal. No congestion or rhinorrhea.     Mouth/Throat:     Mouth: Mucous membranes are moist.     Pharynx: No oropharyngeal exudate.  Eyes:     General:        Right eye: No discharge.        Left eye: No discharge.     Conjunctiva/sclera: Conjunctivae normal.     Pupils: Pupils are equal, round, and reactive to light.  Neck:     Thyroid: No thyromegaly.     Vascular: No JVD.  Cardiovascular:     Rate and Rhythm: Normal rate and regular rhythm.     Pulses: Normal pulses.     Heart sounds: Normal heart sounds, S1 normal and S2 normal. No murmur heard.    No systolic murmur is present.     No diastolic murmur is present.     No friction rub. No gallop. No S3 or S4 sounds.  Pulmonary:     Effort: Pulmonary effort is normal.     Breath sounds: Normal breath sounds. No decreased breath sounds, wheezing, rhonchi or rales.  Abdominal:     General: Bowel sounds are normal.     Palpations: Abdomen is soft. There is no hepatomegaly, splenomegaly or mass.     Tenderness: There is no abdominal tenderness. There is no guarding.  Musculoskeletal:        General: Normal range of motion.     Cervical back: Normal range of  motion and neck supple.     Right foot: Deformity present.  Left foot: Deformity present.  Lymphadenopathy:     Cervical: No cervical adenopathy.  Skin:    General: Skin is warm and dry.     Capillary Refill: Capillary refill takes less than 2 seconds.  Neurological:     Mental Status: She is alert.     Deep Tendon Reflexes: Reflexes are normal and symmetric.     Wt Readings from Last 3 Encounters:  09/28/22 159 lb (72.1 kg)  03/25/22 157 lb (71.2 kg)  02/08/22 159 lb (72.1 kg)    BP 100/70   Pulse 69   Ht 5\' 6"  (1.676 m)   Wt 159 lb (72.1 kg)   SpO2 94%   BMI 25.66 kg/m   Assessment and Plan:  1. Essential hypertension Chronic.  Controlled.  Stable.  Blood pressure today 100/70.  Continue hydrochlorothiazide 25 mg once a day, losartan 100 mg once a day and metoprolol XL 50 mg once a day.  Will check CMP for electrolytes and GFR.  Will recheck in 6 months. - hydrochlorothiazide (HYDRODIURIL) 25 MG tablet; Take 1 tablet (25 mg total) by mouth daily.  Dispense: 90 tablet; Refill: 1 - losartan (COZAAR) 100 MG tablet; Take 1 tablet (100 mg total) by mouth daily.  Dispense: 90 tablet; Refill: 1 - metoprolol succinate (TOPROL-XL) 50 MG 24 hr tablet; Take 1 tablet (50 mg total) by mouth daily. TAKE WITH OR IMMEDIATELY FOLLOWING A MEAL.  Dispense: 90 tablet; Refill: 1 - Comprehensive Metabolic Panel (CMET)  2. Hyperlipidemia, unspecified hyperlipidemia type Chronic.  Controlled.  Stable.  Continue Zetia 10 mg once a day. - ezetimibe (ZETIA) 10 MG tablet; Take 1 tablet (10 mg total) by mouth daily.  Dispense: 90 tablet; Refill: 1  3. Anemia due to folic acid deficiency, unspecified deficiency type Chronic.  Controlled.  Stable.  Will recheck CBC and pending results will likely continue folic acid 800 mcg 1 tablet daily. - folic acid ([employer] FOLIC ACID) 800 MCG tablet; TAKE 1 TABLET DAILY. PT NEEDS TO TAKE 1 WHOLE PILL  Dispense: 90 tablet; Refill: 1 - CBC w/Diff/Platelet  4.  Eczema, unspecified type Recurrent eczema followed by dermatology with higher dosing corticosteroid cream but only as needed basis patient uses triamcinolone 0.1%. - triamcinolone cream (KENALOG) 0.1 %; Apply 1 Application topically 2 (two) times daily.  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  Referral to podiatry. - Ambulatory referral to Podiatry    Elizabeth Sauer, MD

## 2022-09-29 ENCOUNTER — Other Ambulatory Visit: Payer: Self-pay

## 2022-09-29 DIAGNOSIS — R7989 Other specified abnormal findings of blood chemistry: Secondary | ICD-10-CM

## 2022-09-29 DIAGNOSIS — I10 Essential (primary) hypertension: Secondary | ICD-10-CM

## 2022-09-29 LAB — COMPREHENSIVE METABOLIC PANEL
ALT: 19 IU/L (ref 0–32)
AST: 24 IU/L (ref 0–40)
Albumin: 4.5 g/dL (ref 3.8–4.8)
Alkaline Phosphatase: 74 IU/L (ref 44–121)
BUN/Creatinine Ratio: 20 (ref 12–28)
BUN: 27 mg/dL (ref 8–27)
Bilirubin Total: 0.9 mg/dL (ref 0.0–1.2)
CO2: 25 mmol/L (ref 20–29)
Calcium: 9.7 mg/dL (ref 8.7–10.3)
Chloride: 102 mmol/L (ref 96–106)
Creatinine, Ser: 1.38 mg/dL — ABNORMAL HIGH (ref 0.57–1.00)
Globulin, Total: 2.4 g/dL (ref 1.5–4.5)
Glucose: 98 mg/dL (ref 70–99)
Potassium: 3.8 mmol/L (ref 3.5–5.2)
Sodium: 142 mmol/L (ref 134–144)
Total Protein: 6.9 g/dL (ref 6.0–8.5)
eGFR: 40 mL/min/{1.73_m2} — ABNORMAL LOW (ref >59)

## 2022-09-29 LAB — CBC WITH DIFFERENTIAL/PLATELET
Basophils Absolute: 0.1 10*3/uL (ref 0.0–0.2)
Basos: 1 %
EOS (ABSOLUTE): 0.3 10*3/uL (ref 0.0–0.4)
Eos: 5 %
Hematocrit: 43.3 % (ref 34.0–46.6)
Hemoglobin: 14.4 g/dL (ref 11.1–15.9)
Immature Grans (Abs): 0 10*3/uL (ref 0.0–0.1)
Immature Granulocytes: 0 %
Lymphocytes Absolute: 2 10*3/uL (ref 0.7–3.1)
Lymphs: 40 %
MCH: 30.6 pg (ref 26.6–33.0)
MCHC: 33.3 g/dL (ref 31.5–35.7)
MCV: 92 fL (ref 79–97)
Monocytes Absolute: 0.5 10*3/uL (ref 0.1–0.9)
Monocytes: 9 %
Neutrophils Absolute: 2.2 10*3/uL (ref 1.4–7.0)
Neutrophils: 45 %
Platelets: 343 10*3/uL (ref 150–450)
RBC: 4.71 x10E6/uL (ref 3.77–5.28)
RDW: 13.3 % (ref 11.7–15.4)
WBC: 5 10*3/uL (ref 3.4–10.8)

## 2022-09-29 NOTE — Progress Notes (Signed)
Ref placed to neph

## 2022-10-13 ENCOUNTER — Ambulatory Visit (INDEPENDENT_AMBULATORY_CARE_PROVIDER_SITE_OTHER): Payer: Medicare HMO

## 2022-10-13 ENCOUNTER — Other Ambulatory Visit: Payer: Self-pay

## 2022-10-13 VITALS — Ht 66.0 in | Wt 159.0 lb

## 2022-10-13 DIAGNOSIS — Z0000 Encounter for general adult medical examination without abnormal findings: Secondary | ICD-10-CM

## 2022-10-13 DIAGNOSIS — M8580 Other specified disorders of bone density and structure, unspecified site: Secondary | ICD-10-CM

## 2022-10-13 NOTE — Progress Notes (Signed)
Subjective:   Taylor Griffin is a 72 y.o. female who presents for Medicare Annual (Subsequent) preventive examination.  Visit Complete: Virtual  I connected with  Taylor Griffin on 10/13/22 by a audio enabled telemedicine application and verified that I am speaking with the correct person using two identifiers.  Patient Location: Home  Provider Location: Office/Clinic  I discussed the limitations of evaluation and management by telemedicine. The patient expressed understanding and agreed to proceed.  Cardiac Risk Factors include: advanced age (>95men, >78 women);hypertension;dyslipidemia     Objective:    Today's Vitals   10/13/22 0758  Weight: 159 lb (72.1 kg)  Height: 5\' 6"  (1.676 m)   Body mass index is 25.66 kg/m.     10/13/2022    8:08 AM 01/22/2021    8:38 AM 10/01/2020   10:15 AM 12/14/2019    6:44 AM 09/12/2019   10:21 AM 09/06/2018   10:56 AM 01/26/2017    7:11 AM  Advanced Directives  Does Patient Have a Medical Advance Directive? Yes Yes Yes Yes Yes Yes Yes  Type of Estate agent of [place];Living will Healthcare Power of [place];Living will Healthcare Power of [place];Living will Healthcare Power of [place];Living will Healthcare Power of [place];Living will Healthcare Power of [place];Living will Healthcare Power of Attorney  Does patient want to make changes to medical advance directive? No - Patient declined No - Patient declined  No - Patient declined   No - Patient declined  Copy of Healthcare Power of Attorney in Chart? No - copy requested Yes - validated most recent copy scanned in chart (See row information) Yes - validated most recent copy scanned in chart (See row information) No - copy requested Yes - validated most recent copy scanned in chart (See row information) No - copy requested No - copy requested    Current Medications (verified) Outpatient Encounter Medications as of 10/13/2022  Medication Sig   aspirin 81 MG  tablet Take 1 tablet (81 mg total) by mouth daily. AM   Calcium Carbonate-Vit D-Min (CALCIUM 1200 PO) Take 1 tablet by mouth daily.   cholecalciferol (VITAMIN D3) 25 MCG (1000 UNIT) tablet Take 1,000 Units by mouth daily.   ezetimibe (ZETIA) 10 MG tablet Take 1 tablet (10 mg total) by mouth daily.   folic acid ([employer] FOLIC ACID) 800 MCG tablet TAKE 1 TABLET DAILY. PT NEEDS TO TAKE 1 WHOLE PILL   Glycopyrrolate-Formoterol (BEVESPI AEROSPHERE) 9-4.8 MCG/ACT AERO 2 puffs 2 (two) times daily. Dr Karna Christmas   hydrochlorothiazide (HYDRODIURIL) 25 MG tablet Take 1 tablet (25 mg total) by mouth daily.   losartan (COZAAR) 100 MG tablet Take 1 tablet (100 mg total) by mouth daily.   metoprolol succinate (TOPROL-XL) 50 MG 24 hr tablet Take 1 tablet (50 mg total) by mouth daily. TAKE WITH OR IMMEDIATELY FOLLOWING A MEAL.   Multiple Vitamins-Minerals (MULTIVITAMIN WITH MINERALS) tablet Take 1 tablet by mouth daily.   omeprazole (PRILOSEC) 40 MG capsule TAKE 1 CAPSULE (40 MG TOTAL) BY MOUTH DAILY.   triamcinolone cream (KENALOG) 0.1 % Apply 1 Application topically 2 (two) times daily.   fluticasone (FLONASE) 50 MCG/ACT nasal spray Place 2 sprays into the nose daily. Dr A   ipratropium-albuterol (DUONEB) 0.5-2.5 (3) MG/3ML SOLN Inhale 3 mLs into the lungs 3 (three) times daily as needed. Dr A   No facility-administered encounter medications on file as of 10/13/2022.    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   Arthritis    WRIST AND FINGERS   Benign neoplasm of ascending colon    Benign neoplasm of descending colon    Benign neoplasm of sigmoid colon    COPD (chronic obstructive pulmonary disease) (HCC)    Esophageal dysphagia    Gastroesophageal reflux disease 07/11/2017   Heart burn    Hyperlipidemia    Hypertension    CONTROLLED WITH MEDS   Past Surgical History:  Procedure  Laterality Date   COLONOSCOPY WITH PROPOFOL N/A 11/22/2014   Procedure: COLONOSCOPY WITH PROPOFOL;  Surgeon: Midge Minium, MD;  Location: [facility];  Service: Endoscopy;  Laterality: N/A;  LATEX ALLERGY   COLONOSCOPY WITH PROPOFOL N/A 12/14/2019   Procedure: COLONOSCOPY WITH PROPOFOL;  Surgeon: Midge Minium, MD;  Location: [facility];  Service: Endoscopy;  Laterality: N/A;  priority 4   ECTOPIC PREGNANCY SURGERY     ESOPHAGOGASTRODUODENOSCOPY (EGD) WITH PROPOFOL N/A 01/26/2017   Procedure: ESOPHAGOGASTRODUODENOSCOPY (EGD) WITH PROPOFOL;  Surgeon: Toney Reil, MD;  Location: [facility];  Service: Endoscopy;  Laterality: N/A;   ESOPHAGOGASTRODUODENOSCOPY (EGD) WITH PROPOFOL N/A 01/22/2021   Procedure: ESOPHAGOGASTRODUODENOSCOPY (EGD) WITH PROPOFOL;  Surgeon: Toney Reil, MD;  Location: [facility];  Service: Endoscopy;  Laterality: N/A;   POLYPECTOMY  12/14/2019   Procedure: POLYPECTOMY;  Surgeon: Midge Minium, MD;  Location: [facility];  Service: Endoscopy;;   Family History  Problem Relation Age of Onset   Diabetes Mother    Hypertension Mother    Breast cancer Sister 22   Breast cancer Cousin        2 mat cousins   Social History   Socioeconomic History   Marital status: Married    Spouse name: Not on file   Number of children: 2   Years of education: Not on file   Highest education level: Associate degree: academic program  Occupational History   Not on file  Tobacco Use   Smoking status: Former    Current packs/day: 0.50    Average packs/day: 0.5 packs/day for 18.0 years (9.0 ttl pk-yrs)    Types: Cigarettes   Smokeless tobacco: Never   Tobacco comments:    Pt states quit less than 15 years ago in 2023  Vaping Use   Vaping status: Never Used  Substance and Sexual Activity   Alcohol use: No    Alcohol/week: 0.0 standard drinks of alcohol   Drug use: No   Sexual activity: Never  Other Topics Concern   Not on file   Social History Narrative   Not on file   Social Determinants of Health   Financial Resource Strain: Low Risk  (10/13/2022)   Overall Financial Resource Strain (CARDIA)    Difficulty of Paying Living Expenses: Not hard at all  Food Insecurity: No Food Insecurity (10/13/2022)   Hunger Vital Sign    Worried About Running Out of Food in the Last Year: Never true    Ran Out of Food in the Last Year: Never true  Transportation Needs: No Transportation Needs (10/13/2022)   PRAPARE - Administrator, Civil Service (Medical): No    Lack of Transportation (Non-Medical): No  Physical Activity: Sufficiently Active (10/13/2022)   Exercise Vital Sign    Days of Exercise per Week: 3 days    Minutes of Exercise per Session: 50 min  Stress: No Stress Concern Present (10/13/2022)   [employer]  of Occupational Health - Occupational Stress Questionnaire    Feeling of Stress : Not at all  Social Connections: Moderately Integrated (10/13/2022)   Social Connection and Isolation Panel [NHANES]    Frequency of Communication with Friends and Family: More than three times a week    Frequency of Social Gatherings with Friends and Family: Three times a week    Attends Religious Services: More than 4 times per year    Active Member of Clubs or Organizations: No    Attends Banker Meetings: Never    Marital Status: Married    Tobacco Counseling Counseling given: Yes Tobacco comments: Pt states quit less than 15 years ago in 2023   Clinical Intake:     Pain : No/denies pain     BMI - recorded: 25.66 Nutritional Status: BMI 25 -29 Overweight Nutritional Risks: None Diabetes: No  How often do you need to have someone help you when you read instructions, pamphlets, or other written materials from your doctor or pharmacy?: 1 - Never  Interpreter Needed?: No  Information entered by :: Arthur Holms   Activities of Daily Living    10/13/2022    8:03 AM  In your present state of  health, do you have any difficulty performing the following activities:  Hearing? 0  Vision? 0  Difficulty concentrating or making decisions? 0  Walking or climbing stairs? 0  Dressing or bathing? 0  Doing errands, shopping? 0  Preparing Food and eating ? N  Using the Toilet? N  In the past six months, have you accidently leaked urine? N  Do you have problems with loss of bowel control? N  Managing your Medications? N  Managing your Finances? N  Housekeeping or managing your Housekeeping? N    Patient Care Team: Duanne Limerick, MD as PCP - General (Family Medicine)  Indicate any recent Medical Services you may have received from other than [facility] providers in the past year (date may be approximate).     Assessment:   This is a routine wellness examination for Moon.  Hearing/Vision screen Hearing Screening - Comments:: Hearing well over the phone Vision Screening - Comments:: Wears glasses  Dietary issues and exercise activities discussed:     Goals Addressed             This Visit's Progress    Weight (lb) < 200 lb (90.7 kg)   159 lb (72.1 kg)      Depression Screen    10/13/2022    8:11 AM 10/13/2022    8:08 AM 09/28/2022    9:56 AM 03/25/2022   10:36 AM 02/08/2022    3:42 PM 11/11/2021   12:20 PM 03/23/2021   10:36 AM  PHQ 2/9 Scores  PHQ - 2 Score 0 0 0 0 0 0 0  PHQ- 9 Score 0 0 0 0 0 0 0    Fall Risk    10/13/2022    8:10 AM 09/28/2022    9:56 AM 03/25/2022   10:35 AM 02/08/2022    3:41 PM 11/11/2021   12:20 PM  Fall Risk   Falls in the past year? 0 0 0 0 0  Number falls in past yr: 0 0 0 0 0  Injury with Fall? 0 0 0 0 0  Risk for fall due to : No Fall Risks No Fall Risks No Fall Risks No Fall Risks No Fall Risks  Follow up Falls evaluation completed Falls evaluation completed Falls evaluation completed Falls  evaluation completed Falls evaluation completed    MEDICARE RISK AT HOME:  Medicare Risk at Home - 10/13/22 0810     Any stairs in or around the  home? No    If so, are there any without handrails? No    Home free of loose throw rugs in walkways, pet beds, electrical cords, etc? Yes    Adequate lighting in your home to reduce risk of falls? Yes    Life alert? No   advised to get   Use of a cane, walker or w/c? No    Grab bars in the bathroom? Yes    Shower chair or bench in shower? No    Elevated toilet seat or a handicapped toilet? No               Cognitive Function:        10/13/2022    8:11 AM 10/05/2021   10:45 AM  6CIT Screen  What Year? 0 points 0 points  What month? 0 points 0 points  What time? 0 points 0 points  Count back from 20 0 points 0 points  Months in reverse 0 points 0 points  Repeat phrase 2 points 2 points  Total Score 2 points 2 points    Immunizations Immunization History  Administered Date(s) Administered   COVID-19, mRNA, vaccine(Comirnaty)12 years and older 01/18/2022   Fluad Quad(high Dose 65+) 12/25/2018, 01/01/2020, 02/09/2021, 01/18/2022   Influenza, High Dose Seasonal PF 11/01/2016, 01/11/2018   Moderna Covid-19 Vaccine Bivalent Booster 59yrs & up 04/07/2021   Moderna SARS-COV2 Booster Vaccination 02/12/2020   Moderna Sars-Covid-2 Vaccination 04/03/2019   Pneumococcal Conjugate-13 05/03/2016   Pneumococcal Polysaccharide-23 07/11/2017   Tdap 05/03/2016    TDAP status: Up to date  Flu Vaccine status: Up to date  Pneumococcal vaccine status: Due, Education has been provided regarding the importance of this vaccine. Advised may receive this vaccine at local pharmacy or Health Dept. Aware to provide a copy of the vaccination record if obtained from local pharmacy or Health Dept. Verbalized acceptance and understanding.  Covid-19 vaccine status: Declined, Education has been provided regarding the importance of this vaccine but patient still declined. Advised may receive this vaccine at local pharmacy or Health Dept.or vaccine clinic. Aware to provide a copy of the vaccination record  if obtained from local pharmacy or Health Dept. Verbalized acceptance and understanding.  Qualifies for Shingles Vaccine? Yes   Zostavax completed No   Shingrix Completed?: No.    Education has been provided regarding the importance of this vaccine. Patient has been advised to call insurance company to determine out of pocket expense if they have not yet received this vaccine. Advised may also receive vaccine at local pharmacy or Health Dept. Verbalized acceptance and understanding.  Screening Tests Health Maintenance  Topic Date Due   COVID-19 Vaccine (5 - 2023-24 season) 10/14/2022 (Originally 03/15/2022)   Zoster Vaccines- Shingrix (1 of 2) 12/29/2022 (Originally 05/15/1969)   INFLUENZA VACCINE  06/06/2023 (Originally 10/07/2022)   MAMMOGRAM  01/16/2023   Medicare Annual Wellness (AWV)  10/13/2023   DTaP/Tdap/Td (2 - Td or Tdap) 05/03/2026   Colonoscopy  12/14/2026   Pneumonia Vaccine 64+ Years old  Completed   DEXA SCAN  Completed   Hepatitis C Screening  Completed   HPV VACCINES  Aged Out    Health Maintenance  There are no preventive care reminders to display for this patient.  Colorectal cancer screening: Type of screening: Colonoscopy. Completed 12/18/19. Repeat every 7 years  Mammogram status: Completed 01/19/22. Repeat every year  Bone Density status: Completed 10/29/20. Results reflect: Bone density results: OSTEOPENIA. Repeat every 2 years.  Lung Cancer Screening: (Low Dose CT Chest recommended if Age 69-80 years, 20 pack-year currently smoking OR have quit w/in 15years.) does qualify. Had on 12/17/20- Dr A/pulm keeps up with that    Additional Screening:  Hepatitis C Screening: does qualify; Completed does not want  Vision Screening: Recommended annual ophthalmology exams for early detection of glaucoma and other disorders of the eye. Is the patient up to date with their annual eye exam?  Yes  Who is the provider or what is the name of the office in which the patient  attends annual eye exams? [place] location   Dental Screening: Recommended annual dental exams for proper oral hygiene- sees Dr Uvaldo Rising every 6 months    Community Resource Referral / Chronic Care Management: CRR required this visit?  No   CCM required this visit?  No     Plan:     I have personally reviewed and noted the following in the patient's chart:   Medical and social history Use of alcohol, tobacco or illicit drugs  Current medications and supplements including opioid prescriptions. Patient is not currently taking opioid prescriptions. Functional ability and status Nutritional status Physical activity Advanced directives List of other physicians Hospitalizations, surgeries, and ER visits in previous 12 months Vitals Screenings to include cognitive, depression, and falls Referrals and appointments  In addition, I have reviewed and discussed with patient certain preventive protocols, quality metrics, and best practice recommendations. A written personalized care plan for preventive services as well as general preventive health recommendations were provided to patient.     Everitt Amber   10/13/2022   After Visit Summary: (MyChart) Due to this being a telephonic visit, the after visit summary with patients personalized plan was offered to patient via MyChart   Nurse Notes: bone density ordered

## 2022-10-13 NOTE — Progress Notes (Signed)
Scheduled bone density for Oct 24 @ 900 in [place]

## 2022-10-13 NOTE — Patient Instructions (Signed)
Taylor Griffin , Thank you for taking time to come for your Medicare Wellness Visit. I appreciate your ongoing commitment to your health goals. Please review the following plan we discussed and let me know if I can assist you in the future.   These are the goals we discussed:  Goals      DIET - INCREASE WATER INTAKE     Recommend drinking 6-8 glasses of water per day     Weight (lb) < 200 lb (90.7 kg)        This is a list of the screening recommended for you and due dates:  Health Maintenance  Topic Date Due   COVID-19 Vaccine (5 - 2023-24 season) 10/14/2022*   Zoster (Shingles) Vaccine (1 of 2) 12/29/2022*   Flu Shot  06/06/2023*   Mammogram  01/16/2023   Medicare Annual Wellness Visit  10/13/2023   DTaP/Tdap/Td vaccine (2 - Td or Tdap) 05/03/2026   Colon Cancer Screening  12/14/2026   Pneumonia Vaccine  Completed   DEXA scan (bone density measurement)  Completed   Hepatitis C Screening  Completed   HPV Vaccine  Aged Out  *Topic was postponed. The date shown is not the original due date.   Health Maintenance After Age 26 After age 42, you are at a higher risk for certain long-term diseases and infections as well as injuries from falls. Falls are a major cause of broken bones and head injuries in people who are older than age 63. Getting regular preventive care can help to keep you healthy and well. Preventive care includes getting regular testing and making lifestyle changes as recommended by your health care provider. Talk with your health care provider about: Which screenings and tests you should have. A screening is a test that checks for a disease when you have no symptoms. A diet and exercise plan that is right for you. What should I know about screenings and tests to prevent falls? Screening and testing are the best ways to find a health problem early. Early diagnosis and treatment give you the best chance of managing medical conditions that are common after age 79. Certain  conditions and lifestyle choices may make you more likely to have a fall. Your health care provider may recommend: Regular vision checks. Poor vision and conditions such as cataracts can make you more likely to have a fall. If you wear glasses, make sure to get your prescription updated if your vision changes. Medicine review. Work with your health care provider to regularly review all of the medicines you are taking, including over-the-counter medicines. Ask your health care provider about any side effects that may make you more likely to have a fall. Tell your health care provider if any medicines that you take make you feel dizzy or sleepy. Strength and balance checks. Your health care provider may recommend certain tests to check your strength and balance while standing, walking, or changing positions. Foot health exam. Foot pain and numbness, as well as not wearing proper footwear, can make you more likely to have a fall. Screenings, including: Osteoporosis screening. Osteoporosis is a condition that causes the bones to get weaker and break more easily. Blood pressure screening. Blood pressure changes and medicines to control blood pressure can make you feel dizzy. Depression screening. You may be more likely to have a fall if you have a fear of falling, feel depressed, or feel unable to do activities that you used to do. Alcohol use screening. Using too much alcohol  can affect your balance and may make you more likely to have a fall. Follow these instructions at home: Lifestyle Do not drink alcohol if: Your health care provider tells you not to drink. If you drink alcohol: Limit how much you have to: 0-1 drink a day for women. 0-2 drinks a day for men. Know how much alcohol is in your drink. In the [place] one drink equals one 12 oz bottle of beer (355 mL), one 5 oz glass of wine (148 mL), or one 1 oz glass of hard liquor (44 mL). Do not use any products that contain nicotine or tobacco.  These products include cigarettes, chewing tobacco, and vaping devices, such as e-cigarettes. If you need help quitting, ask your health care provider. Activity  Follow a regular exercise program to stay fit. This will help you maintain your balance. Ask your health care provider what types of exercise are appropriate for you. If you need a cane or walker, use it as recommended by your health care provider. Wear supportive shoes that have nonskid soles. Safety  Remove any tripping hazards, such as rugs, cords, and clutter. Install safety equipment such as grab bars in bathrooms and safety rails on stairs. Keep rooms and walkways well-lit. General instructions Talk with your health care provider about your risks for falling. Tell your health care provider if: You fall. Be sure to tell your health care provider about all falls, even ones that seem minor. You feel dizzy, tiredness (fatigue), or off-balance. Take over-the-counter and prescription medicines only as told by your health care provider. These include supplements. Eat a healthy diet and maintain a healthy weight. A healthy diet includes low-fat dairy products, low-fat (lean) meats, and fiber from whole grains, beans, and lots of fruits and vegetables. Stay current with your vaccines. Schedule regular health, dental, and eye exams. Summary Having a healthy lifestyle and getting preventive care can help to protect your health and wellness after age 2. Screening and testing are the best way to find a health problem early and help you avoid having a fall. Early diagnosis and treatment give you the best chance for managing medical conditions that are more common for people who are older than age 58. Falls are a major cause of broken bones and head injuries in people who are older than age 32. Take precautions to prevent a fall at home. Work with your health care provider to learn what changes you can make to improve your health and wellness  and to prevent falls. This information is not intended to replace advice given to you by your health care provider. Make sure you discuss any questions you have with your health care provider. Document Revised: 07/14/2020 Document Reviewed: 07/14/2020 Elsevier Patient Education  2024 [employer].

## 2022-10-18 ENCOUNTER — Encounter: Payer: Self-pay | Admitting: Podiatry

## 2022-10-18 ENCOUNTER — Ambulatory Visit: Payer: Medicare HMO | Admitting: Podiatry

## 2022-10-18 ENCOUNTER — Ambulatory Visit (INDEPENDENT_AMBULATORY_CARE_PROVIDER_SITE_OTHER): Payer: Medicare HMO

## 2022-10-18 DIAGNOSIS — M2042 Other hammer toe(s) (acquired), left foot: Secondary | ICD-10-CM | POA: Diagnosis not present

## 2022-10-18 DIAGNOSIS — M2041 Other hammer toe(s) (acquired), right foot: Secondary | ICD-10-CM

## 2022-10-18 NOTE — Progress Notes (Signed)
Subjective:  Patient ID: Taylor Griffin, female    DOB: December 24, 1950,  MRN: [id number] HPI Chief Complaint  Patient presents with   Toe Pain    2nd toe bilateral - overlapping big toe x years, starting to become more uncomfortable, especially with shoes   New Patient (Initial Visit)    72 y.o. female presents with the above complaint.   ROS: She denies fever chills nausea by muscle aches pains calf pain back pain chest pain shortness of breath.  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  She states that her brother had bunion and hammertoe procedures performed and did well.  Past Medical History:  Diagnosis Date   Anemia    Anemia due to folic acid deficiency 05/03/2016   Arthritis    WRIST AND FINGERS   Benign neoplasm of ascending colon    Benign neoplasm of descending colon    Benign neoplasm of sigmoid colon    COPD (chronic obstructive pulmonary disease) (HCC)    Esophageal dysphagia    Gastroesophageal reflux disease 07/11/2017   Heart burn    Hyperlipidemia    Hypertension    CONTROLLED WITH MEDS   Past Surgical History:  Procedure Laterality Date   COLONOSCOPY WITH PROPOFOL N/A 11/22/2014   Procedure: COLONOSCOPY WITH PROPOFOL;  Surgeon: Midge Minium, MD;  Location: [facility];  Service: Endoscopy;  Laterality: N/A;  LATEX ALLERGY   COLONOSCOPY WITH PROPOFOL N/A 12/14/2019   Procedure: COLONOSCOPY WITH PROPOFOL;  Surgeon: Midge Minium, MD;  Location: [facility];  Service: Endoscopy;  Laterality: N/A;  priority 4   ECTOPIC PREGNANCY SURGERY     ESOPHAGOGASTRODUODENOSCOPY (EGD) WITH PROPOFOL N/A 01/26/2017   Procedure: ESOPHAGOGASTRODUODENOSCOPY (EGD) WITH PROPOFOL;  Surgeon: Toney Reil, MD;  Location: [facility];  Service: Endoscopy;  Laterality: N/A;   ESOPHAGOGASTRODUODENOSCOPY (EGD) WITH PROPOFOL N/A 01/22/2021   Procedure: ESOPHAGOGASTRODUODENOSCOPY (EGD) WITH PROPOFOL;  Surgeon: Toney Reil, MD;  Location: [facility];  Service: Endoscopy;  Laterality: N/A;   POLYPECTOMY  12/14/2019   Procedure: POLYPECTOMY;  Surgeon: Midge Minium, MD;  Location: [facility];  Service: Endoscopy;;    Current Outpatient Medications:    Ergocalciferol 10 MCG (400 UNIT) TABS, Take by mouth., Disp: , Rfl:    VENTOLIN HFA 108 (90 Base) MCG/ACT inhaler, INHALE 2 INHALATIONS INTO THE LUNGS EVERY 6 (SIX) HOURS AS NEEDED FOR WHEEZING FOR UP TO 90 DAYS, Disp: , Rfl:    aspirin 81 MG tablet, Take 1 tablet (81 mg total) by mouth daily. AM, Disp: 30 tablet, Rfl: 11   Calcium Carbonate-Vit D-Min (CALCIUM 1200 PO), Take 1 tablet by mouth daily., Disp: , Rfl:    cholecalciferol (VITAMIN D3) 25 MCG (1000 UNIT) tablet, Take 1,000 Units by mouth daily., Disp: , Rfl:    ezetimibe (ZETIA) 10 MG tablet, Take 1 tablet (10 mg total) by mouth daily., Disp: 90 tablet, Rfl: 1   fluticasone (FLONASE) 50 MCG/ACT nasal spray, Place 2 sprays into the nose daily. Dr A, Disp: , Rfl:    folic acid ([employer]S FOLIC ACID) 800 MCG tablet, TAKE 1 TABLET DAILY. PT NEEDS TO TAKE 1 WHOLE PILL, Disp: 90 tablet, Rfl: 1   Glycopyrrolate-Formoterol (BEVESPI AEROSPHERE) 9-4.8 MCG/ACT AERO, 2 puffs 2 (two) times daily. Dr Karna Christmas, Disp: , Rfl:    hydrochlorothiazide (HYDRODIURIL) 25 MG tablet, Take 1 tablet (25 mg total) by mouth daily., Disp: 90 tablet, Rfl: 1   ipratropium-albuterol (  DUONEB) 0.5-2.5 (3) MG/3ML SOLN, Inhale 3 mLs into the lungs 3 (three) times daily as needed. Dr A, Disp: , Rfl:    losartan (COZAAR) 100 MG tablet, Take 1 tablet (100 mg total) by mouth daily., Disp: 90 tablet, Rfl: 1   metoprolol succinate (TOPROL-XL) 50 MG 24 hr tablet, Take 1 tablet (50 mg total) by mouth daily. TAKE WITH OR IMMEDIATELY FOLLOWING A MEAL., Disp: 90 tablet, Rfl: 1   Multiple Vitamins-Minerals (MULTIVITAMIN WITH MINERALS) tablet, Take 1 tablet by mouth daily., Disp: , Rfl:    omeprazole (PRILOSEC) 40 MG capsule, TAKE 1  CAPSULE (40 MG TOTAL) BY MOUTH DAILY., Disp: 30 capsule, Rfl: 0   triamcinolone cream (KENALOG) 0.1 %, Apply 1 Application topically 2 (two) times daily., Disp: 30 g, Rfl: 0  Allergies  Allergen Reactions   Latex Itching   Pravastatin Sodium    Review of Systems Objective:  There were no vitals filed for this visit.  General: Well developed, nourished, in no acute distress, alert and oriented x3   Dermatological: Skin is warm, dry and supple bilateral. Nails x 10 are well maintained; remaining integument appears unremarkable at this time. There are no open sores, no preulcerative lesions, no rash or signs of infection present.  Vascular: Dorsalis Pedis artery and Posterior Tibial artery pedal pulses are 2/4 bilateral with immedate capillary fill time. Pedal hair growth present. No varicosities and no lower extremity edema present bilateral.   Neruologic: Grossly intact via light touch bilateral. Vibratory intact via tuning fork bilateral. Protective threshold with Semmes Wienstein monofilament intact to all pedal sites bilateral. Patellar and Achilles deep tendon reflexes 2+ bilateral. No Babinski or clonus noted bilateral.   Musculoskeletal: No gross boney pedal deformities bilateral. No pain, crepitus, or limitation noted with foot and ankle range of motion bilateral. Muscular strength 5/5 in all groups tested bilateral.  Gait: Unassisted, Nonantalgic.    Radiographs:  Radiographs taken today demonstrate an osseously mature individual with some demineralization generalized throughout bilateral x-rays hallux valgus deformity with some osteoarthritic change first metatarsophalangeal joint bilateral left is worse than the right she also has a slightly elongated second metatarsal and hammertoe deformities lesser digits bilateral she has had an arthroplasty to the fifth digit bilateral.  Osteoarthritic change PIPJ second digit left foot  Assessment & Plan:   Assessment: Hallux  abductovalgus deformity plantarflexed second metatarsal hammertoe deformities second and to a lesser degree third and fourth bilateral left greater than right  Plan: Discussed etiology pathology conservative versus surgical therapies.  At this point we discussed in great detail surgical correction and the fact that there was nothing conservative that could be done for correcting the bunion and toed deformities.  She understands this and is amenable to it we will follow-up with me in December for surgery and January surgery should consist of a bunion repair second metatarsal osteotomy hammertoe repair #2 #3 #4 of the left foot with K wires.      T. [place], [place]

## 2022-10-21 DIAGNOSIS — I10 Essential (primary) hypertension: Secondary | ICD-10-CM | POA: Diagnosis not present

## 2022-10-21 DIAGNOSIS — N1832 Chronic kidney disease, stage 3b: Secondary | ICD-10-CM | POA: Diagnosis not present

## 2022-10-27 ENCOUNTER — Other Ambulatory Visit: Payer: Self-pay | Admitting: Nephrology

## 2022-10-27 DIAGNOSIS — N1832 Chronic kidney disease, stage 3b: Secondary | ICD-10-CM

## 2022-11-02 ENCOUNTER — Ambulatory Visit
Admission: RE | Admit: 2022-11-02 | Discharge: 2022-11-02 | Disposition: A | Discharge status: Home / Self Care | Payer: Medicare HMO | Source: Ambulatory Visit | Attending: Nephrology | Admitting: Nephrology

## 2022-11-02 DIAGNOSIS — N1832 Chronic kidney disease, stage 3b: Secondary | ICD-10-CM | POA: Insufficient documentation

## 2022-11-02 DIAGNOSIS — N189 Chronic kidney disease, unspecified: Secondary | ICD-10-CM | POA: Diagnosis not present

## 2022-11-25 DIAGNOSIS — I10 Essential (primary) hypertension: Secondary | ICD-10-CM | POA: Diagnosis not present

## 2022-11-25 DIAGNOSIS — N1831 Chronic kidney disease, stage 3a: Secondary | ICD-10-CM | POA: Diagnosis not present

## 2022-12-27 ENCOUNTER — Other Ambulatory Visit: Payer: Self-pay | Admitting: Family Medicine

## 2022-12-27 DIAGNOSIS — L309 Dermatitis, unspecified: Secondary | ICD-10-CM

## 2022-12-29 ENCOUNTER — Other Ambulatory Visit: Payer: Self-pay | Admitting: Family Medicine

## 2022-12-29 DIAGNOSIS — I10 Essential (primary) hypertension: Secondary | ICD-10-CM

## 2022-12-30 ENCOUNTER — Ambulatory Visit
Admission: RE | Admit: 2022-12-30 | Discharge: 2022-12-30 | Disposition: A | Discharge status: Home / Self Care | Payer: Medicare HMO | Source: Ambulatory Visit | Attending: Family Medicine | Admitting: Family Medicine

## 2022-12-30 DIAGNOSIS — M8588 Other specified disorders of bone density and structure, other site: Secondary | ICD-10-CM | POA: Diagnosis not present

## 2022-12-30 DIAGNOSIS — Z780 Asymptomatic menopausal state: Secondary | ICD-10-CM | POA: Insufficient documentation

## 2022-12-30 DIAGNOSIS — M810 Age-related osteoporosis without current pathological fracture: Secondary | ICD-10-CM | POA: Diagnosis not present

## 2023-03-17 ENCOUNTER — Ambulatory Visit: Payer: Medicare HMO | Admitting: Podiatry

## 2023-03-29 DIAGNOSIS — I10 Essential (primary) hypertension: Secondary | ICD-10-CM | POA: Diagnosis not present

## 2023-03-29 DIAGNOSIS — N1831 Chronic kidney disease, stage 3a: Secondary | ICD-10-CM | POA: Diagnosis not present

## 2023-04-01 ENCOUNTER — Encounter: Payer: Self-pay | Admitting: Family Medicine

## 2023-04-01 ENCOUNTER — Ambulatory Visit (INDEPENDENT_AMBULATORY_CARE_PROVIDER_SITE_OTHER): Payer: Medicare HMO | Admitting: Family Medicine

## 2023-04-01 VITALS — BP 124/78 | HR 70 | Ht 66.0 in | Wt 164.0 lb

## 2023-04-01 DIAGNOSIS — E785 Hyperlipidemia, unspecified: Secondary | ICD-10-CM | POA: Diagnosis not present

## 2023-04-01 DIAGNOSIS — Z23 Encounter for immunization: Secondary | ICD-10-CM | POA: Diagnosis not present

## 2023-04-01 DIAGNOSIS — I10 Essential (primary) hypertension: Secondary | ICD-10-CM | POA: Diagnosis not present

## 2023-04-01 MED ORDER — HYDROCHLOROTHIAZIDE 25 MG PO TABS
25.0000 mg | ORAL_TABLET | Freq: Every day | ORAL | 1 refills | Status: DC
Start: 2023-04-01 — End: 2023-07-22

## 2023-04-01 MED ORDER — METOPROLOL SUCCINATE ER 50 MG PO TB24
50.0000 mg | ORAL_TABLET | Freq: Every day | ORAL | 1 refills | Status: DC
Start: 2023-04-01 — End: 2023-07-22

## 2023-04-01 MED ORDER — LOSARTAN POTASSIUM 100 MG PO TABS
100.0000 mg | ORAL_TABLET | Freq: Every day | ORAL | 1 refills | Status: DC
Start: 2023-04-01 — End: 2023-07-22

## 2023-04-01 MED ORDER — EZETIMIBE 10 MG PO TABS
10.0000 mg | ORAL_TABLET | Freq: Every day | ORAL | 1 refills | Status: DC
Start: 2023-04-01 — End: 2023-07-22

## 2023-04-01 NOTE — Patient Instructions (Signed)

## 2023-04-01 NOTE — Progress Notes (Signed)
Date:  04/01/2023   Name:  Taylor Griffin   DOB:  April 22, 1950   MRN:  [id number]   Chief Complaint: Hypertension and Hyperlipidemia  Hypertension This is a chronic problem. The current episode started more than 1 year ago. The problem has been gradually improving since onset. The problem is controlled. Pertinent negatives include no anxiety, blurred vision, chest pain, headaches, malaise/fatigue, neck pain, orthopnea, palpitations, peripheral edema, PND, shortness of breath or sweats. There are no associated agents to hypertension. There are no known risk factors for coronary artery disease. Past treatments include diuretics, angiotensin blockers and beta blockers. The current treatment provides moderate improvement. There are no compliance problems.  There is no history of CAD/MI or CVA. There is no history of chronic renal disease, a hypertension causing med or renovascular disease.  Hyperlipidemia This is a chronic problem. The current episode started more than 1 year ago. The problem is controlled. Recent lipid tests were reviewed and are normal. She has no history of chronic renal disease, diabetes, hypothyroidism, liver disease, obesity or nephrotic syndrome. Pertinent negatives include no chest pain, focal sensory loss, focal weakness, leg pain, myalgias or shortness of breath. There are no compliance problems.  Risk factors for coronary artery disease include hypertension.    Lab Results  Component Value Date   NA 142 09/28/2022   K 3.8 09/28/2022   CO2 25 09/28/2022   GLUCOSE 98 09/28/2022   BUN 27 09/28/2022   CREATININE 1.38 (H) 09/28/2022   CALCIUM 9.7 09/28/2022   EGFR 40 (L) 09/28/2022   GFRNONAA 34 (L) 01/28/2022   Lab Results  Component Value Date   CHOL 217 (H) 03/25/2022   HDL 49 03/25/2022   LDLCALC 144 (H) 03/25/2022   TRIG 131 03/25/2022   CHOLHDL 5.2 (H) 07/11/2017   No results found for: "TSH" No results found for: "HGBA1C" Lab Results  Component Value  Date   WBC 5.0 09/28/2022   HGB 14.4 09/28/2022   HCT 43.3 09/28/2022   MCV 92 09/28/2022   PLT 343 09/28/2022   Lab Results  Component Value Date   ALT 19 09/28/2022   AST 24 09/28/2022   ALKPHOS 74 09/28/2022   BILITOT 0.9 09/28/2022   No results found for: "25OHVITD2", "25OHVITD3", "VD25OH"   Review of Systems  Constitutional:  Negative for chills, fever and malaise/fatigue.  HENT:  Negative for drooling, ear discharge, ear pain and sore throat.   Eyes:  Negative for blurred vision.  Respiratory:  Negative for cough, shortness of breath and wheezing.   Cardiovascular:  Negative for chest pain, palpitations, orthopnea, leg swelling and PND.  Gastrointestinal:  Negative for abdominal pain, blood in stool, constipation, diarrhea and nausea.  Endocrine: Negative for polydipsia.  Genitourinary:  Negative for dysuria, frequency, hematuria and urgency.  Musculoskeletal:  Negative for back pain, myalgias and neck pain.  Skin:  Negative for rash.  Allergic/Immunologic: Negative for environmental allergies.  Neurological:  Negative for dizziness, focal weakness and headaches.  Hematological:  Does not bruise/bleed easily.  Psychiatric/Behavioral:  Negative for suicidal ideas. The patient is not nervous/anxious.     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   Esophageal dysphagia    Hyperlipidemia, unspecified 05/03/2016   Hypertension 05/03/2016    Allergies  Allergen Reactions   Latex Itching   Pravastatin Sodium     Past Surgical History:  Procedure Laterality Date   COLONOSCOPY WITH PROPOFOL N/A 11/22/2014   Procedure: COLONOSCOPY WITH PROPOFOL;  Surgeon: Midge Minium, MD;  Location: [facility];  Service: Endoscopy;  Laterality: N/A;  LATEX ALLERGY   COLONOSCOPY WITH PROPOFOL N/A 12/14/2019   Procedure: COLONOSCOPY WITH PROPOFOL;   Surgeon: Midge Minium, MD;  Location: [facility];  Service: Endoscopy;  Laterality: N/A;  priority 4   ECTOPIC PREGNANCY SURGERY     ESOPHAGOGASTRODUODENOSCOPY (EGD) WITH PROPOFOL N/A 01/26/2017   Procedure: ESOPHAGOGASTRODUODENOSCOPY (EGD) WITH PROPOFOL;  Surgeon: Toney Reil, MD;  Location: [facility];  Service: Endoscopy;  Laterality: N/A;   ESOPHAGOGASTRODUODENOSCOPY (EGD) WITH PROPOFOL N/A 01/22/2021   Procedure: ESOPHAGOGASTRODUODENOSCOPY (EGD) WITH PROPOFOL;  Surgeon: Toney Reil, MD;  Location: [facility];  Service: Endoscopy;  Laterality: N/A;   POLYPECTOMY  12/14/2019   Procedure: POLYPECTOMY;  Surgeon: Midge Minium, MD;  Location: [facility];  Service: Endoscopy;;    Social History   Tobacco Use   Smoking status: Former    Current packs/day: 0.50    Average packs/day: 0.5 packs/day for 18.0 years (9.0 ttl pk-yrs)    Types: Cigarettes   Smokeless tobacco: Never   Tobacco comments:    Pt states quit less than 15 years ago in 2023  Vaping Use   Vaping status: Never Used  Substance Use Topics   Alcohol use: No    Alcohol/week: 0.0 standard drinks of alcohol   Drug use: No     Medication list has been reviewed and updated.  Current Meds  Medication Sig   aspirin 81 MG tablet Take 1 tablet (81 mg total) by mouth daily. AM   Calcium Carbonate-Vit D-Min (CALCIUM 1200 PO) Take 1 tablet by mouth daily.   cholecalciferol (VITAMIN D3) 25 MCG (1000 UNIT) tablet Take 1,000 Units by mouth daily.   Ergocalciferol 10 MCG (400 UNIT) TABS Take by mouth.   ezetimibe (ZETIA) 10 MG tablet Take 1 tablet (10 mg total) by mouth daily.   folic acid ([employer]S FOLIC ACID) 800 MCG tablet TAKE 1 TABLET DAILY. PT NEEDS TO TAKE 1 WHOLE PILL   Glycopyrrolate-Formoterol (BEVESPI AEROSPHERE) 9-4.8 MCG/ACT AERO 2 puffs 2 (two) times daily. Dr Karna Christmas   hydrochlorothiazide (HYDRODIURIL) 25 MG tablet Take 1 tablet (25 mg total) by mouth daily.   losartan  (COZAAR) 100 MG tablet TAKE 1 TABLET BY MOUTH EVERY DAY   metoprolol succinate (TOPROL-XL) 50 MG 24 hr tablet Take 1 tablet (50 mg total) by mouth daily. TAKE WITH OR IMMEDIATELY FOLLOWING A MEAL.   Multiple Vitamins-Minerals (MULTIVITAMIN WITH MINERALS) tablet Take 1 tablet by mouth daily.   omeprazole (PRILOSEC) 40 MG capsule TAKE 1 CAPSULE (40 MG TOTAL) BY MOUTH DAILY.   triamcinolone cream (KENALOG) 0.1 % APPLY TO AFFECTED AREA TWICE A DAY   VENTOLIN HFA 108 (90 Base) MCG/ACT inhaler INHALE 2 INHALATIONS INTO THE LUNGS EVERY 6 (SIX) HOURS AS NEEDED FOR WHEEZING FOR UP TO 90 DAYS       04/01/2023   10:16 AM 09/28/2022    9:56 AM 03/25/2022   10:36 AM 02/08/2022    3:42 PM  GAD 7 : Generalized Anxiety Score  Nervous, Anxious, on Edge 0 0 0 0  Control/stop worrying 0 0 0 0  Worry too much - different things 0 0 0 0  Trouble relaxing 0 0 0 0  Restless 0 0 0 0  Easily annoyed or irritable 0 0 0 0  Afraid - awful might happen 0  0 0 0  Total GAD 7 Score 0 0 0 0  Anxiety Difficulty Not difficult at all Not difficult at all Not difficult at all Not difficult at all       04/01/2023   10:16 AM 10/13/2022    8:11 AM 10/13/2022    8:08 AM  Depression screen PHQ 2/9  Decreased Interest 0 0 0  Down, Depressed, Hopeless 0 0 0  PHQ - 2 Score 0 0 0  Altered sleeping 0 0 0  Tired, decreased energy 0 0 0  Change in appetite 0 0 0  Feeling bad or failure about yourself  0 0 0  Trouble concentrating 0 0 0  Moving slowly or fidgety/restless 0 0 0  Suicidal thoughts 0 0 0  PHQ-9 Score 0 0 0  Difficult doing work/chores Not difficult at all Not difficult at all Not difficult at all    BP Readings from Last 3 Encounters:  04/01/23 124/78  09/28/22 100/70  03/25/22 120/76    Physical Exam Vitals and nursing note reviewed.  Constitutional:      General: She is not in acute distress.    Appearance: She is not diaphoretic.  HENT:     Head: Normocephalic and atraumatic.     Right Ear:  Tympanic membrane and external ear normal.     Left Ear: Tympanic membrane and external ear normal.     Nose: Nose normal. No congestion or rhinorrhea.  Eyes:     General:        Right eye: No discharge.        Left eye: No discharge.     Conjunctiva/sclera: Conjunctivae normal.     Pupils: Pupils are equal, round, and reactive to light.  Neck:     Thyroid: No thyromegaly.     Vascular: No JVD.  Cardiovascular:     Rate and Rhythm: Normal rate and regular rhythm.     Heart sounds: Normal heart sounds. No murmur heard.    No friction rub. No gallop.  Pulmonary:     Effort: Pulmonary effort is normal.     Breath sounds: Normal breath sounds. No wheezing, rhonchi or rales.  Abdominal:     General: Bowel sounds are normal.     Palpations: Abdomen is soft. There is no mass.     Tenderness: There is no abdominal tenderness. There is no guarding.  Musculoskeletal:        General: Normal range of motion.     Cervical back: Normal range of motion and neck supple.  Lymphadenopathy:     Cervical: No cervical adenopathy.  Skin:    General: Skin is warm and dry.  Neurological:     Mental Status: She is alert.     Deep Tendon Reflexes: Reflexes are normal and symmetric.     Wt Readings from Last 3 Encounters:  04/01/23 164 lb (74.4 kg)  10/13/22 159 lb (72.1 kg)  09/28/22 159 lb (72.1 kg)    BP 124/78   Pulse 70   Ht 5\' 6"  (1.676 m)   Wt 164 lb (74.4 kg)   SpO2 96%   BMI 26.47 kg/m   Assessment and Plan: 1. Essential hypertension (Primary) Chronic.  Controlled.  Stable.  Blood pressure 124/78.  Asymptomatic.  Tolerating medications well.  Continue hydrochlorothiazide 25 mg once a day, losartan 100 mg once a day and metoprolol XL 50 mg once a day.  Will check CMP for electrolytes and GFR.  Will recheck patient in  6 months. - hydrochlorothiazide (HYDRODIURIL) 25 MG tablet; Take 1 tablet (25 mg total) by mouth daily.  Dispense: 90 tablet; Refill: 1 - losartan (COZAAR) 100 MG  tablet; Take 1 tablet (100 mg total) by mouth daily.  Dispense: 90 tablet; Refill: 1 - metoprolol succinate (TOPROL-XL) 50 MG 24 hr tablet; Take 1 tablet (50 mg total) by mouth daily. TAKE WITH OR IMMEDIATELY FOLLOWING A MEAL.  Dispense: 90 tablet; Refill: 1 - Comprehensive metabolic panel  2. Hyperlipidemia, unspecified hyperlipidemia type Chronic.  Controlled.  Stable.  Asymptomatic while taking Zetia 10 mg once a day.  Check lipid panel for current level of control.  Patient is also been reissued a low-cholesterol low triglyceride dietary guidelines. - ezetimibe (ZETIA) 10 MG tablet; Take 1 tablet (10 mg total) by mouth daily.  Dispense: 90 tablet; Refill: 1 - Lipid Panel With LDL/HDL Ratio  3. Immunization due Discussed and administered - Flu Vaccine Trivalent High Dose (Fluad)     Elizabeth Sauer, MD

## 2023-04-02 ENCOUNTER — Encounter: Payer: Self-pay | Admitting: Family Medicine

## 2023-04-02 LAB — COMPREHENSIVE METABOLIC PANEL
ALT: 18 [IU]/L (ref 0–32)
AST: 22 [IU]/L (ref 0–40)
Albumin: 4.4 g/dL (ref 3.8–4.8)
Alkaline Phosphatase: 83 [IU]/L (ref 44–121)
BUN/Creatinine Ratio: 18 (ref 12–28)
BUN: 23 mg/dL (ref 8–27)
Bilirubin Total: 0.6 mg/dL (ref 0.0–1.2)
CO2: 28 mmol/L (ref 20–29)
Calcium: 9.5 mg/dL (ref 8.7–10.3)
Chloride: 99 mmol/L (ref 96–106)
Creatinine, Ser: 1.3 mg/dL — ABNORMAL HIGH (ref 0.57–1.00)
Globulin, Total: 2.5 g/dL (ref 1.5–4.5)
Glucose: 96 mg/dL (ref 70–99)
Potassium: 4.2 mmol/L (ref 3.5–5.2)
Sodium: 142 mmol/L (ref 134–144)
Total Protein: 6.9 g/dL (ref 6.0–8.5)
eGFR: 43 mL/min/{1.73_m2} — ABNORMAL LOW (ref >59)

## 2023-04-02 LAB — LIPID PANEL WITH LDL/HDL RATIO
Cholesterol, Total: 199 mg/dL (ref 100–199)
HDL: 44 mg/dL (ref >39)
LDL Chol Calc (NIH): 130 mg/dL — ABNORMAL HIGH (ref 0–99)
LDL/HDL Ratio: 3 {ratio} (ref 0.0–3.2)
Triglycerides: 141 mg/dL (ref 0–149)
VLDL Cholesterol Cal: 25 mg/dL (ref 5–40)

## 2023-04-14 ENCOUNTER — Ambulatory Visit (INDEPENDENT_AMBULATORY_CARE_PROVIDER_SITE_OTHER): Payer: Medicare HMO | Admitting: Podiatry

## 2023-04-14 DIAGNOSIS — M21612 Bunion of left foot: Secondary | ICD-10-CM

## 2023-04-14 DIAGNOSIS — M2012 Hallux valgus (acquired), left foot: Secondary | ICD-10-CM

## 2023-04-14 DIAGNOSIS — M2042 Other hammer toe(s) (acquired), left foot: Secondary | ICD-10-CM

## 2023-04-14 DIAGNOSIS — S99922A Unspecified injury of left foot, initial encounter: Secondary | ICD-10-CM

## 2023-04-14 DIAGNOSIS — E559 Vitamin D deficiency, unspecified: Secondary | ICD-10-CM

## 2023-04-14 NOTE — Progress Notes (Signed)
 Subjective:  Patient ID: Taylor Griffin, female    DOB: 1950-09-22,  MRN: [id number]  Chief Complaint  Patient presents with   Surgical Consult    LT 2nd overlapping 1st. Here to discuss surgery to correct this. Takes asa 81 mg. Not diabetic.     73 y.o. female presents with the above complaint. History confirmed with patient.  She notes pain in the second toe and under the second toe joint.  The bunion is painful as well.  Makes wearing shoes difficult.  Rubs on the top of the shoe and bottom of the shoe.  Objective:  Physical Exam: warm, good capillary refill, no trophic changes or ulcerative lesions, normal DP and PT pulses, normal sensory exam, and hallux valgus deformity with large dorsal medial bunion, limited range of motion with crepitance in the joint she has rigid hammertoe contracture of the second toe with subluxation dorsally of the toe on the MTP joint, semireducible third digit and reducible fourth digit.   Radiographs: Multiple views x-ray of the left foot: Hallux valgus with arthritic changes she has subluxation of the second MTP joint elongated second and third metatarsals Assessment:   1. Vitamin D  deficiency   2. Hammertoe of left foot   3. Hallux valgus with bunions, left   4. Plantar plate injury, left, initial encounter      Plan:  Patient was evaluated and treated and all questions answered.  Discussed the etiology and treatment including surgical and non surgical treatment for painful bunions and hammertoes.  She has exhausted all non surgical treatment prior to this visit including shoe gear changes and padding.  She desires surgical intervention. We discussed all risks including but not limited to: pain, swelling, infection, scar, numbness which may be temporary or permanent, chronic pain, stiffness, nerve pain or damage, wound healing problems, bone healing problems including delayed or non-union and recurrence. Specifically we discussed the following  procedures: Arthrodesis of the first MTP joint, Weil osteotomy of the second and third elongated metatarsals, with hammertoe PIPJ arthrodesis of the second and third toes, possible second plantar plate repair, bone graft from the heel, flexor tenotomy of the left fourth toe. Informed consent was signed today. Surgery will be scheduled at a mutually agreeable date. Information regarding this will be forwarded to our surgery scheduler. In the interim until surgery I recommended utilizing as wide of shoes as possible, take NSAIDs or tylenol  as tolerated for pain, and a bunion padding shield which can be purchased online.  Order for vitamin D  level placed her calcium level is adequate.  We discussed the period of nonweightbearing for 2 to 3 weeks and that she should remain out of work for 3 to 4 weeks but when she returns will need to be nonweightbearing on the scooter for extended periods of time   Surgical plan:  Procedure: -Arthrodesis of the first MTP joint, Weil osteotomy of the second and third elongated metatarsals, with hammertoe PIPJ arthrodesis of the second and third toes, possible second plantar plate repair, bone graft from the heel, flexor tenotomy of the left fourth toe  Location: -[facility]  Anesthesia plan: -Sedation with regional block  Postoperative pain plan: - Tylenol  1000 mg every 6 hours, ibuprofen  600 mg every 6 hours, gabapentin  300 mg every 8 hours x5 days, oxycodone  5 mg 1-2 tabs every 6 hours only as needed  DVT prophylaxis: -She will resume aspirin  postop  WB Restrictions / DME needs: -NWB in splint postop  No follow-ups on file.

## 2023-04-14 NOTE — Patient Instructions (Signed)
 Call [facility] to schedule your Ultrasound at the below locations.  Please allow at least 1 business day after your visit to process the referral.  It may take longer depending on approval from insurance.  Please let me know if you have issues or problems scheduling the Ultrasound   [facility] [phone] [address] [place] [address] [place], [place] [address]  [facility] [phone] [address] [place], [place] [address]

## 2023-04-15 DIAGNOSIS — E559 Vitamin D deficiency, unspecified: Secondary | ICD-10-CM | POA: Diagnosis not present

## 2023-04-16 LAB — VITAMIN D 25 HYDROXY (VIT D DEFICIENCY, FRACTURES): Vit D, 25-Hydroxy: 50.8 ng/mL (ref 30.0–100.0)

## 2023-04-18 ENCOUNTER — Encounter: Payer: Self-pay | Admitting: Podiatry

## 2023-04-25 ENCOUNTER — Telehealth: Payer: Self-pay | Admitting: Podiatry

## 2023-04-25 NOTE — Telephone Encounter (Signed)
 DOS-05/25/23   MET OSTEOTOMY X2 [id number] TENOTOMY LT-28010 HAMMERTOE REPAIR X2 LT-28285 HALLUX MPJ FUSION [id number] LIGAMENT REPAIR [id number]  [employer] EFFECTIVE DATE- 03/08/20  SPOKE WITH EDWARD Q FROM [employer] AND HE STATED THAT PRIOR AUTH IS NOT REQUIRED FOR CPT CODES [id number] AND [id number].  CALL REF #: [id number]

## 2023-04-28 ENCOUNTER — Other Ambulatory Visit: Payer: Medicare HMO

## 2023-05-07 HISTORY — PX: FOOT SURGERY: SHX648

## 2023-05-12 ENCOUNTER — Ambulatory Visit
Admission: RE | Admit: 2023-05-12 | Discharge: 2023-05-12 | Disposition: A | Discharge status: Home / Self Care | Payer: Medicare HMO | Source: Ambulatory Visit | Attending: Podiatry | Admitting: Podiatry

## 2023-05-12 DIAGNOSIS — S99922A Unspecified injury of left foot, initial encounter: Secondary | ICD-10-CM

## 2023-05-12 DIAGNOSIS — G8929 Other chronic pain: Secondary | ICD-10-CM | POA: Diagnosis not present

## 2023-05-12 DIAGNOSIS — M79672 Pain in left foot: Secondary | ICD-10-CM | POA: Diagnosis not present

## 2023-05-16 ENCOUNTER — Encounter: Payer: Self-pay | Admitting: Podiatry

## 2023-05-25 ENCOUNTER — Other Ambulatory Visit: Payer: Self-pay | Admitting: Podiatry

## 2023-05-25 DIAGNOSIS — M89772 Major osseous defect, left ankle and foot: Secondary | ICD-10-CM | POA: Diagnosis not present

## 2023-05-25 DIAGNOSIS — M2042 Other hammer toe(s) (acquired), left foot: Secondary | ICD-10-CM | POA: Diagnosis not present

## 2023-05-25 DIAGNOSIS — G8918 Other acute postprocedural pain: Secondary | ICD-10-CM | POA: Diagnosis not present

## 2023-05-25 DIAGNOSIS — M21542 Acquired clubfoot, left foot: Secondary | ICD-10-CM | POA: Diagnosis not present

## 2023-05-25 DIAGNOSIS — M7742 Metatarsalgia, left foot: Secondary | ICD-10-CM | POA: Diagnosis not present

## 2023-05-25 DIAGNOSIS — M2012 Hallux valgus (acquired), left foot: Secondary | ICD-10-CM | POA: Diagnosis not present

## 2023-05-25 MED ORDER — ACETAMINOPHEN 500 MG PO TABS: 1000.0000 mg | ORAL_TABLET | Freq: Four times a day (QID) | ORAL | 0 refills | Status: AC | PRN

## 2023-05-25 MED ORDER — IBUPROFEN 600 MG PO TABS: 600.0000 mg | ORAL_TABLET | Freq: Four times a day (QID) | ORAL | 0 refills | Status: AC | PRN

## 2023-05-25 MED ORDER — GABAPENTIN 300 MG PO CAPS: 300.0000 mg | ORAL_CAPSULE | Freq: Three times a day (TID) | ORAL | 0 refills | Status: DC

## 2023-05-25 MED ORDER — OXYCODONE HCL 5 MG PO TABS
5.0000 mg | ORAL_TABLET | ORAL | 0 refills | Status: AC | PRN
Start: 2023-05-25 — End: 2023-06-01

## 2023-05-25 NOTE — Progress Notes (Signed)
 3/19 MPJ fusion, met osteotomies and HT repair 2/3

## 2023-05-31 ENCOUNTER — Ambulatory Visit (INDEPENDENT_AMBULATORY_CARE_PROVIDER_SITE_OTHER)

## 2023-05-31 ENCOUNTER — Encounter: Payer: Self-pay | Admitting: Podiatry

## 2023-05-31 ENCOUNTER — Ambulatory Visit (INDEPENDENT_AMBULATORY_CARE_PROVIDER_SITE_OTHER): Payer: Medicare HMO | Admitting: Podiatry

## 2023-05-31 DIAGNOSIS — M21612 Bunion of left foot: Secondary | ICD-10-CM

## 2023-05-31 DIAGNOSIS — M2012 Hallux valgus (acquired), left foot: Secondary | ICD-10-CM

## 2023-05-31 DIAGNOSIS — M2042 Other hammer toe(s) (acquired), left foot: Secondary | ICD-10-CM

## 2023-06-01 ENCOUNTER — Encounter: Payer: Self-pay | Admitting: Podiatry

## 2023-06-01 NOTE — Progress Notes (Signed)
  Subjective:  Patient ID: Taylor Griffin, female    DOB: [birth year],  MRN: [id number]  Chief Complaint  Patient presents with   Routine Post Op    Rm 4: POV # 1 DOS 05/25/23 --- LEFT FOOT BUNION CORRECTION WITH JOINT FUSION AND BONE GRAFT FROM HEEL, HAMMERTOE CORRECTION AND METATARSAL SHORTENING 2,3, TENDON RELEASE 4TH, POSSIBLE LIGAMENT REPAIR 2ND Pt denies any pain, or signs and symptoms of infections. Sutures and hardware intact. Patient does have swelling to foot. She states she feel fine and likes her toes.    73 y.o. female returns for post-op check.   Review of Systems: Negative except as noted in the HPI. Denies N/V/F/Ch.   Objective:  There were no vitals filed for this visit. There is no height or weight on file to calculate BMI. Constitutional Well developed. Well nourished.  Vascular Foot warm and well perfused. Capillary refill normal to all digits.  Calf is soft and supple, no posterior calf or knee pain, negative Homans' sign  Neurologic Normal speech. Oriented to person, place, and time. Epicritic sensation to light touch grossly present bilaterally.  Dermatologic Skin healing well without signs of infection. Skin edges well coapted without signs of infection.  Orthopedic: Tenderness to palpation noted about the surgical site.   Multiple view plain film radiographs: Good correction noted there is mild elevation of the second metatarsal head Assessment:   1. Hallux valgus with bunions, left   2. Hammertoe of left foot    Plan:  Patient was evaluated and treated and all questions answered.  S/p foot surgery left -Progressing as expected post-operatively. -XR: Noted above.  Some elevation of second tarsal head but should be adequate correction still -WB Status:   nonweightbearing CAM Walker boot -Sutures: Removed in 2 weeks. -Medications: No refills required not having much pain at all -Foot redressed.  No follow-ups on file.

## 2023-06-14 ENCOUNTER — Ambulatory Visit: Payer: Medicare HMO | Admitting: Podiatry

## 2023-06-14 DIAGNOSIS — M21612 Bunion of left foot: Secondary | ICD-10-CM

## 2023-06-14 DIAGNOSIS — M2012 Hallux valgus (acquired), left foot: Secondary | ICD-10-CM

## 2023-06-14 DIAGNOSIS — M2042 Other hammer toe(s) (acquired), left foot: Secondary | ICD-10-CM

## 2023-06-16 ENCOUNTER — Telehealth: Payer: Self-pay | Admitting: Podiatry

## 2023-06-16 NOTE — Telephone Encounter (Signed)
 Patient is requesting prescription for yeast infection. Please contact patient at [phone]

## 2023-06-16 NOTE — Progress Notes (Signed)
  Subjective:  Patient ID: Taylor Griffin, female    DOB: 08/30/1950,  MRN: [id number]  No chief complaint on file.   73 y.o. female returns for post-op check.   Review of Systems: Negative except as noted in the HPI. Denies N/V/F/Ch.   Objective:  There were no vitals filed for this visit. There is no height or weight on file to calculate BMI. Constitutional Well developed. Well nourished.  Vascular Foot warm and well perfused. Capillary refill normal to all digits.  Calf is soft and supple, no posterior calf or knee pain, negative Homans' sign  Neurologic Normal speech. Oriented to person, place, and time. Epicritic sensation to light touch grossly present bilaterally.  Dermatologic Skin healing well without signs of infection. Skin edges well coapted without signs of infection.  Orthopedic: Tenderness to palpation noted about the surgical site.   Multiple view plain film radiographs: Good correction noted there is mild elevation of the second metatarsal head Assessment:   1. Hallux valgus with bunions, left   2. Hammertoe of left foot    Plan:  Patient was evaluated and treated and all questions answered.  S/p foot surgery left -doing well sutures removed uneventfully. Can partial WB on heel In boot. Return 3 weeks for new xrays  Return in about 3 weeks (around 07/05/2023) for post op (new x-rays).

## 2023-07-05 ENCOUNTER — Ambulatory Visit (INDEPENDENT_AMBULATORY_CARE_PROVIDER_SITE_OTHER)

## 2023-07-05 ENCOUNTER — Encounter: Payer: Self-pay | Admitting: Podiatry

## 2023-07-05 ENCOUNTER — Ambulatory Visit (INDEPENDENT_AMBULATORY_CARE_PROVIDER_SITE_OTHER): Payer: Medicare HMO | Admitting: Podiatry

## 2023-07-05 VITALS — Ht 66.0 in | Wt 164.0 lb

## 2023-07-05 DIAGNOSIS — M21612 Bunion of left foot: Secondary | ICD-10-CM

## 2023-07-05 DIAGNOSIS — M2042 Other hammer toe(s) (acquired), left foot: Secondary | ICD-10-CM

## 2023-07-05 DIAGNOSIS — M2012 Hallux valgus (acquired), left foot: Secondary | ICD-10-CM

## 2023-07-05 NOTE — Progress Notes (Signed)
  Subjective:  Patient ID: Taylor Griffin, female    DOB: 1950/05/21,  MRN: [id number]  Chief Complaint  Patient presents with   Routine Post Op    Pt is here for routine visit after surgery to left foot due to bunion correction, pt states everything is going well, she has no complaints.    73 y.o. female returns for post-op check.   Review of Systems: Negative except as noted in the HPI. Denies N/V/F/Ch.   Objective:  There were no vitals filed for this visit. Body mass index is 26.47 kg/m. Constitutional Well developed. Well nourished.  Vascular Foot warm and well perfused. Capillary refill normal to all digits.  Calf is soft and supple, no posterior calf or knee pain, negative Homans' sign  Neurologic Normal speech. Oriented to person, place, and time. Epicritic sensation to light touch grossly present bilaterally.  Dermatologic Incision well-healed minimal hypertrophy some limited plantarflexion of second toe  Orthopedic: No pain to palpation noted about the surgical site.   Multiple view plain film radiographs: Good consolidation noted across fusion and osteotomy sites Assessment:   1. Hallux valgus with bunions, left   2. Hammertoe of left foot    Plan:  Patient was evaluated and treated and all questions answered.  S/p foot surgery left - Removed uneventfully transition to surgical shoe this was dispensed today.  May be full weightbearing in this.  Return in 6 weeks for new x-rays.  Should be able to transition to shoe gear after that.  Return in about 6 weeks (around 08/16/2023) for post op (new x-rays).

## 2023-07-22 ENCOUNTER — Ambulatory Visit (INDEPENDENT_AMBULATORY_CARE_PROVIDER_SITE_OTHER): Payer: Self-pay | Admitting: Family Medicine

## 2023-07-22 VITALS — BP 122/82 | HR 80 | Ht 66.0 in | Wt 167.0 lb

## 2023-07-22 DIAGNOSIS — E785 Hyperlipidemia, unspecified: Secondary | ICD-10-CM | POA: Diagnosis not present

## 2023-07-22 DIAGNOSIS — I10 Essential (primary) hypertension: Secondary | ICD-10-CM

## 2023-07-22 DIAGNOSIS — D529 Folate deficiency anemia, unspecified: Secondary | ICD-10-CM

## 2023-07-22 DIAGNOSIS — R1319 Other dysphagia: Secondary | ICD-10-CM | POA: Diagnosis not present

## 2023-07-22 MED ORDER — METOPROLOL SUCCINATE ER 50 MG PO TB24
50.0000 mg | ORAL_TABLET | Freq: Every day | ORAL | 1 refills | Status: DC
Start: 2023-07-22 — End: 2024-01-17

## 2023-07-22 MED ORDER — OMEPRAZOLE 40 MG PO CPDR
40.0000 mg | DELAYED_RELEASE_CAPSULE | Freq: Every day | ORAL | 1 refills | Status: DC
Start: 2023-07-22 — End: 2024-01-06

## 2023-07-22 MED ORDER — HYDROCHLOROTHIAZIDE 25 MG PO TABS
25.0000 mg | ORAL_TABLET | Freq: Every day | ORAL | 1 refills | Status: DC
Start: 2023-07-22 — End: 2024-01-17

## 2023-07-22 MED ORDER — LOSARTAN POTASSIUM 100 MG PO TABS
100.0000 mg | ORAL_TABLET | Freq: Every day | ORAL | 1 refills | Status: DC
Start: 2023-07-22 — End: 2024-01-17

## 2023-07-22 MED ORDER — EZETIMIBE 10 MG PO TABS
10.0000 mg | ORAL_TABLET | Freq: Every day | ORAL | 1 refills | Status: DC
Start: 2023-07-22 — End: 2024-01-17

## 2023-07-22 MED ORDER — FOLIC ACID 800 MCG PO TABS
ORAL_TABLET | ORAL | 1 refills | Status: AC
Start: 2023-07-22 — End: ?

## 2023-07-22 NOTE — Progress Notes (Signed)
 Date:  07/22/2023   Name:  Taylor Griffin   DOB:  [birth year]   MRN:  [id number]   Chief Complaint: Hypertension  Hypertension This is a chronic problem. The current episode started more than 1 year ago. The problem has been gradually improving since onset. The problem is controlled. Pertinent negatives include no anxiety, blurred vision, chest pain, headaches, malaise/fatigue, neck pain, orthopnea, palpitations, peripheral edema, PND, shortness of breath or sweats. There are no associated agents to hypertension. There are no known risk factors for coronary artery disease. Past treatments include beta blockers, diuretics and angiotensin blockers. The current treatment provides moderate improvement. There are no compliance problems.  There is no history of CAD/MI or CVA. There is no history of chronic renal disease, a hypertension causing med or renovascular disease.  Anemia Presents for follow-up (folic acid  deficiency) visit. There has been no abdominal pain, bruising/bleeding easily, malaise/fatigue, palpitations or weight loss. Signs of blood loss that are not present include hematemesis, hematochezia, melena, menorrhagia and vaginal bleeding. There is no history of chronic renal disease. There are no compliance problems.   Hyperlipidemia This is a chronic problem. The current episode started more than 1 year ago. The problem is controlled. Recent lipid tests were reviewed and are normal. She has no history of chronic renal disease or diabetes. Pertinent negatives include no chest pain, myalgias or shortness of breath. Current antihyperlipidemic treatment includes ezetimibe . The current treatment provides moderate improvement of lipids.  Gastroesophageal Reflux She reports no abdominal pain, no chest pain, no coughing or no wheezing. This is a chronic problem. The current episode started more than 1 year ago. The problem occurs occasionally. The problem has been gradually improving. The  symptoms are aggravated by certain foods. Pertinent negatives include no melena or weight loss. She has tried a PPI for the symptoms.    Lab Results  Component Value Date   NA 142 04/01/2023   K 4.2 04/01/2023   CO2 28 04/01/2023   GLUCOSE 96 04/01/2023   BUN 23 04/01/2023   CREATININE 1.30 (H) 04/01/2023   CALCIUM 9.5 04/01/2023   EGFR 43 (L) 04/01/2023   GFRNONAA 34 (L) 01/28/2022   Lab Results  Component Value Date   CHOL 199 04/01/2023   HDL 44 04/01/2023   LDLCALC 130 (H) 04/01/2023   TRIG 141 04/01/2023   CHOLHDL 5.2 (H) 07/11/2017   No results found for: "TSH" No results found for: "HGBA1C" Lab Results  Component Value Date   WBC 5.0 09/28/2022   HGB 14.4 09/28/2022   HCT 43.3 09/28/2022   MCV 92 09/28/2022   PLT 343 09/28/2022   Lab Results  Component Value Date   ALT 18 04/01/2023   AST 22 04/01/2023   ALKPHOS 83 04/01/2023   BILITOT 0.6 04/01/2023   Lab Results  Component Value Date   VD25OH 50.8 04/15/2023     Review of Systems  Constitutional:  Negative for malaise/fatigue and weight loss.  Eyes:  Negative for blurred vision and visual disturbance.  Respiratory:  Negative for apnea, cough, shortness of breath and wheezing.   Cardiovascular:  Negative for chest pain, palpitations, orthopnea and PND.  Gastrointestinal:  Negative for abdominal pain, blood in stool, diarrhea, hematemesis, hematochezia and melena.  Genitourinary:  Negative for menorrhagia, vaginal bleeding and vaginal pain.  Musculoskeletal:  Negative for myalgias and neck pain.  Neurological:  Negative for headaches.  Hematological:  Does not bruise/bleed easily.    Patient Active Problem List  Diagnosis Date Noted   Spondylolysis of cervical spine 10/01/2020   Centrilobular emphysema (HCC) 08/21/2018   Dyshidrotic eczema 07/11/2017   Esophageal dysphagia    Hyperlipidemia, unspecified 05/03/2016   Hypertension 05/03/2016    Allergies  Allergen Reactions   Latex Itching    Pravastatin  Sodium     Past Surgical History:  Procedure Laterality Date   COLONOSCOPY WITH PROPOFOL  N/A 11/22/2014   Procedure: COLONOSCOPY WITH PROPOFOL ;  Surgeon: Marnee Sink, MD;  Location: [facility];  Service: Endoscopy;  Laterality: N/A;  LATEX ALLERGY   COLONOSCOPY WITH PROPOFOL  N/A 12/14/2019   Procedure: COLONOSCOPY WITH PROPOFOL ;  Surgeon: Marnee Sink, MD;  Location: [facility];  Service: Endoscopy;  Laterality: N/A;  priority 4   ECTOPIC PREGNANCY SURGERY     ESOPHAGOGASTRODUODENOSCOPY (EGD) WITH PROPOFOL  N/A 01/26/2017   Procedure: ESOPHAGOGASTRODUODENOSCOPY (EGD) WITH PROPOFOL ;  Surgeon: Selena Daily, MD;  Location: [facility];  Service: Endoscopy;  Laterality: N/A;   ESOPHAGOGASTRODUODENOSCOPY (EGD) WITH PROPOFOL  N/A 01/22/2021   Procedure: ESOPHAGOGASTRODUODENOSCOPY (EGD) WITH PROPOFOL ;  Surgeon: Selena Daily, MD;  Location: [facility];  Service: Endoscopy;  Laterality: N/A;   FOOT SURGERY Left 05/2023   POLYPECTOMY  12/14/2019   Procedure: POLYPECTOMY;  Surgeon: Marnee Sink, MD;  Location: [facility];  Service: Endoscopy;;    Social History   Tobacco Use   Smoking status: Former    Current packs/day: 0.50    Average packs/day: 0.5 packs/day for 18.0 years (9.0 ttl pk-yrs)    Types: Cigarettes   Smokeless tobacco: Never   Tobacco comments:    Pt states quit less than 15 years ago in 2023  Vaping Use   Vaping status: Never Used  Substance Use Topics   Alcohol use: No    Alcohol/week: 0.0 standard drinks of alcohol   Drug use: No     Medication list has been reviewed and updated.  Current Meds  Medication Sig   aspirin  81 MG tablet Take 1 tablet (81 mg total) by mouth daily. AM   Calcium Carbonate-Vit D-Min (CALCIUM 1200 PO) Take 1 tablet by mouth daily.   cholecalciferol (VITAMIN D3) 25 MCG (1000 UNIT) tablet Take 1,000 Units by mouth daily.   Ergocalciferol  10 MCG (400 UNIT) TABS Take by mouth.    ezetimibe  (ZETIA ) 10 MG tablet Take 1 tablet (10 mg total) by mouth daily.   fluticasone (FLONASE) 50 MCG/ACT nasal spray Place 2 sprays into the nose daily. Dr A   folic acid  ([employer] FOLIC ACID ) 800 MCG tablet TAKE 1 TABLET DAILY. PT NEEDS TO TAKE 1 WHOLE PILL   Glycopyrrolate -Formoterol (BEVESPI AEROSPHERE) 9-4.8 MCG/ACT AERO 2 puffs 2 (two) times daily. Dr Aleskerov   hydrochlorothiazide  (HYDRODIURIL ) 25 MG tablet Take 1 tablet (25 mg total) by mouth daily.   ipratropium-albuterol  (DUONEB) 0.5-2.5 (3) MG/3ML SOLN Inhale 3 mLs into the lungs 3 (three) times daily as needed. Dr A   losartan  (COZAAR ) 100 MG tablet Take 1 tablet (100 mg total) by mouth daily.   metoprolol  succinate (TOPROL -XL) 50 MG 24 hr tablet Take 1 tablet (50 mg total) by mouth daily. TAKE WITH OR IMMEDIATELY FOLLOWING A MEAL.   Multiple Vitamins-Minerals (MULTIVITAMIN WITH MINERALS) tablet Take 1 tablet by mouth daily.   omeprazole  (PRILOSEC) 40 MG capsule TAKE 1 CAPSULE (40 MG TOTAL) BY MOUTH DAILY.   triamcinolone  cream (KENALOG ) 0.1 % APPLY TO AFFECTED AREA TWICE A DAY       07/22/2023   10:11 AM 04/01/2023  10:16 AM 09/28/2022    9:56 AM 03/25/2022   10:36 AM  GAD 7 : Generalized Anxiety Score  Nervous, Anxious, on Edge 0 0 0 0  Control/stop worrying 0 0 0 0  Worry too much - different things 0 0 0 0  Trouble relaxing 0 0 0 0  Restless 0 0 0 0  Easily annoyed or irritable 0 0 0 0  Afraid - awful might happen 0 0 0 0  Total GAD 7 Score 0 0 0 0  Anxiety Difficulty Not difficult at all Not difficult at all Not difficult at all Not difficult at all       07/22/2023   10:11 AM 04/01/2023   10:16 AM 10/13/2022    8:11 AM  Depression screen PHQ 2/9  Decreased Interest 0 0 0  Down, Depressed, Hopeless 0 0 0  PHQ - 2 Score 0 0 0  Altered sleeping  0 0  Tired, decreased energy  0 0  Change in appetite  0 0  Feeling bad or failure about yourself   0 0  Trouble concentrating  0 0  Moving slowly or fidgety/restless   0 0  Suicidal thoughts  0 0  PHQ-9 Score  0 0  Difficult doing work/chores  Not difficult at all Not difficult at all    BP Readings from Last 3 Encounters:  07/22/23 122/82  04/01/23 124/78  09/28/22 100/70    Physical Exam Vitals and nursing note reviewed.  Constitutional:      General: She is not in acute distress.    Appearance: She is not diaphoretic.  HENT:     Head: Normocephalic and atraumatic.     Right Ear: External ear normal.     Left Ear: External ear normal.     Nose: Nose normal.  Eyes:     General:        Right eye: No discharge.        Left eye: No discharge.     Conjunctiva/sclera: Conjunctivae normal.     Pupils: Pupils are equal, round, and reactive to light.  Neck:     Thyroid: No thyromegaly.     Vascular: No JVD.  Cardiovascular:     Rate and Rhythm: Normal rate and regular rhythm.     Heart sounds: Normal heart sounds. No murmur heard.    No friction rub. No gallop.  Pulmonary:     Effort: Pulmonary effort is normal.     Breath sounds: Normal breath sounds. No wheezing, rhonchi or rales.  Abdominal:     General: Bowel sounds are normal.     Palpations: Abdomen is soft. There is no mass.     Tenderness: There is no abdominal tenderness. There is no guarding.  Musculoskeletal:        General: Normal range of motion.     Cervical back: Normal range of motion and neck supple.  Lymphadenopathy:     Cervical: No cervical adenopathy.  Skin:    General: Skin is warm and dry.  Neurological:     Mental Status: She is alert.     Deep Tendon Reflexes: Reflexes are normal and symmetric.     Wt Readings from Last 3 Encounters:  07/22/23 167 lb (75.8 kg)  07/05/23 164 lb (74.4 kg)  04/01/23 164 lb (74.4 kg)    BP 122/82   Pulse 80   Ht 5\' 6"  (1.676 m)   Wt 167 lb (75.8 kg)   SpO2 94%  BMI 26.95 kg/m   Assessment and Plan:  1. Hyperlipidemia, unspecified hyperlipidemia type Chronic.  Controlled.  Stable.  Asymptomatic.  Patient is not  experiencing any myalgias or muscle weakness.  Will continue on Zetia  10 mg once a day.  Review of previous lipid panel is acceptable and we will recheck in 6 months. - ezetimibe  (ZETIA ) 10 MG tablet; Take 1 tablet (10 mg total) by mouth daily.  Dispense: 90 tablet; Refill: 1  2. Anemia due to folic acid  deficiency, unspecified deficiency type Chronic.  Controlled.  Stable.  Last hemoglobin was noted to be 14.3.  Will continue folic acid  over-the-counter and will check CBC for current level of hemoglobin and indices. - folic acid  ([employer]S FOLIC ACID ) 800 MCG tablet; TAKE 1 TABLET DAILY. PT NEEDS TO TAKE 1 WHOLE PILL  Dispense: 90 tablet; Refill: 1 - CBC with Differential/Platelet  3. Essential hypertension (Primary) Phonic.  Controlled.  Stable.  Blood pressure today is 122/82.  Asymptomatic.  Tolerating medications well.  Will continue hydrochlorothiazide  25 mg once a day, losartan  100 mg once a day, and metoprolol  XL 50 mg once a day.  Will continue current dosings and when recheck in 6 months. - hydrochlorothiazide  (HYDRODIURIL ) 25 MG tablet; Take 1 tablet (25 mg total) by mouth daily.  Dispense: 90 tablet; Refill: 1 - losartan  (COZAAR ) 100 MG tablet; Take 1 tablet (100 mg total) by mouth daily.  Dispense: 90 tablet; Refill: 1 - metoprolol  succinate (TOPROL -XL) 50 MG 24 hr tablet; Take 1 tablet (50 mg total) by mouth daily. TAKE WITH OR IMMEDIATELY FOLLOWING A MEAL.  Dispense: 90 tablet; Refill: 1  4. Esophageal dysphagia Chronic.  Controlled.  Stable.  Patient currently is on omeprazole  40 mg daily.  Will continue at current dosing and follow-up will be with Dr. Baldomero Bone when she has reestablished practice at [facility] clinic gastroenterology. - omeprazole  (PRILOSEC) 40 MG capsule; Take 1 capsule (40 mg total) by mouth daily.  Dispense: 90 capsule; Refill: 1    Alayne Allis, MD

## 2023-07-23 LAB — CBC WITH DIFFERENTIAL/PLATELET
Basophils Absolute: 0.1 10*3/uL (ref 0.0–0.2)
Basos: 1 %
EOS (ABSOLUTE): 0.2 10*3/uL (ref 0.0–0.4)
Eos: 3 %
Hematocrit: 44.7 % (ref 34.0–46.6)
Hemoglobin: 14.7 g/dL (ref 11.1–15.9)
Immature Grans (Abs): 0 10*3/uL (ref 0.0–0.1)
Immature Granulocytes: 0 %
Lymphocytes Absolute: 2 10*3/uL (ref 0.7–3.1)
Lymphs: 40 %
MCH: 30.8 pg (ref 26.6–33.0)
MCHC: 32.9 g/dL (ref 31.5–35.7)
MCV: 94 fL (ref 79–97)
Monocytes Absolute: 0.5 10*3/uL (ref 0.1–0.9)
Monocytes: 9 %
Neutrophils Absolute: 2.4 10*3/uL (ref 1.4–7.0)
Neutrophils: 47 %
Platelets: 367 10*3/uL (ref 150–450)
RBC: 4.78 x10E6/uL (ref 3.77–5.28)
RDW: 13.3 % (ref 11.7–15.4)
WBC: 5 10*3/uL (ref 3.4–10.8)

## 2023-07-28 DIAGNOSIS — I10 Essential (primary) hypertension: Secondary | ICD-10-CM | POA: Diagnosis not present

## 2023-07-28 DIAGNOSIS — N1831 Chronic kidney disease, stage 3a: Secondary | ICD-10-CM | POA: Diagnosis not present

## 2023-08-02 DIAGNOSIS — N1832 Chronic kidney disease, stage 3b: Secondary | ICD-10-CM | POA: Diagnosis not present

## 2023-08-02 DIAGNOSIS — I10 Essential (primary) hypertension: Secondary | ICD-10-CM | POA: Diagnosis not present

## 2023-08-16 ENCOUNTER — Ambulatory Visit: Admitting: Podiatry

## 2023-08-25 ENCOUNTER — Ambulatory Visit (INDEPENDENT_AMBULATORY_CARE_PROVIDER_SITE_OTHER)

## 2023-08-25 ENCOUNTER — Ambulatory Visit (INDEPENDENT_AMBULATORY_CARE_PROVIDER_SITE_OTHER): Admitting: Podiatry

## 2023-08-25 ENCOUNTER — Encounter: Payer: Self-pay | Admitting: Podiatry

## 2023-08-25 VITALS — Ht 66.0 in | Wt 167.0 lb

## 2023-08-25 DIAGNOSIS — M21612 Bunion of left foot: Secondary | ICD-10-CM | POA: Diagnosis not present

## 2023-08-25 DIAGNOSIS — M2012 Hallux valgus (acquired), left foot: Secondary | ICD-10-CM

## 2023-08-25 NOTE — Progress Notes (Signed)
  Subjective:  Patient ID: Taylor Griffin, female    DOB: 06-17-1950,  MRN: [id number]  Chief Complaint  Patient presents with   Routine Post Op    Pt is here to f/0 on left foot after surgery she states her toes have been giving her a problem no other concerns.    73 y.o. female returns for post-op check. Still feels swollen some, the toes are tender but otherwise doing well  Review of Systems: Negative except as noted in the HPI. Denies N/V/F/Ch.   Objective:  There were no vitals filed for this visit. Body mass index is 26.95 kg/m. Constitutional Well developed. Well nourished.  Vascular Foot warm and well perfused. Capillary refill normal to all digits.  Calf is soft and supple, no posterior calf or knee pain, negative Homans' sign  Neurologic Normal speech. Oriented to person, place, and time. Epicritic sensation to light touch grossly present bilaterally.  Dermatologic Scars well-healed minimal hypertrophic  Orthopedic: No pain to palpation noted about the surgical site.  Imp motion of second MTP   Multiple view plain film radiographs: Good consolidation noted across fusion and osteotomy sites, significant correction and improvement in alignment from preoperative films Assessment:   1. Hallux valgus with bunions, left    Plan:  Patient was evaluated and treated and all questions answered.  S/p foot surgery left - Doing well.  Continue regular shoe gear and activity as tolerated.  Expect scar tissue and edema to resolve gradually with time.  Follow-up with me as needed.  No follow-ups on file.

## 2023-10-17 DIAGNOSIS — J449 Chronic obstructive pulmonary disease, unspecified: Secondary | ICD-10-CM | POA: Diagnosis not present

## 2023-10-17 DIAGNOSIS — J42 Unspecified chronic bronchitis: Secondary | ICD-10-CM | POA: Diagnosis not present

## 2023-10-24 ENCOUNTER — Encounter

## 2023-10-26 DIAGNOSIS — J449 Chronic obstructive pulmonary disease, unspecified: Secondary | ICD-10-CM | POA: Diagnosis not present

## 2023-12-05 DIAGNOSIS — I10 Essential (primary) hypertension: Secondary | ICD-10-CM | POA: Diagnosis not present

## 2023-12-05 DIAGNOSIS — N1832 Chronic kidney disease, stage 3b: Secondary | ICD-10-CM | POA: Diagnosis not present

## 2024-01-06 ENCOUNTER — Other Ambulatory Visit: Payer: Self-pay

## 2024-01-06 DIAGNOSIS — R1319 Other dysphagia: Secondary | ICD-10-CM

## 2024-01-06 MED ORDER — OMEPRAZOLE 40 MG PO CPDR
40.0000 mg | DELAYED_RELEASE_CAPSULE | Freq: Every day | ORAL | 1 refills | Status: AC
Start: 2024-01-06 — End: ?

## 2024-01-17 ENCOUNTER — Encounter: Payer: Self-pay | Admitting: Student

## 2024-01-17 ENCOUNTER — Ambulatory Visit: Admitting: Student

## 2024-01-17 VITALS — BP 112/68 | HR 73 | Temp 97.7°F | Ht 66.0 in | Wt 165.0 lb

## 2024-01-17 DIAGNOSIS — E782 Mixed hyperlipidemia: Secondary | ICD-10-CM

## 2024-01-17 DIAGNOSIS — N183 Chronic kidney disease, stage 3 unspecified: Secondary | ICD-10-CM | POA: Insufficient documentation

## 2024-01-17 DIAGNOSIS — J0100 Acute maxillary sinusitis, unspecified: Secondary | ICD-10-CM | POA: Diagnosis not present

## 2024-01-17 DIAGNOSIS — I10 Essential (primary) hypertension: Secondary | ICD-10-CM | POA: Diagnosis not present

## 2024-01-17 DIAGNOSIS — K219 Gastro-esophageal reflux disease without esophagitis: Secondary | ICD-10-CM

## 2024-01-17 DIAGNOSIS — E785 Hyperlipidemia, unspecified: Secondary | ICD-10-CM | POA: Diagnosis not present

## 2024-01-17 DIAGNOSIS — M8589 Other specified disorders of bone density and structure, multiple sites: Secondary | ICD-10-CM

## 2024-01-17 DIAGNOSIS — Z23 Encounter for immunization: Secondary | ICD-10-CM | POA: Diagnosis not present

## 2024-01-17 DIAGNOSIS — J432 Centrilobular emphysema: Secondary | ICD-10-CM

## 2024-01-17 DIAGNOSIS — N1832 Chronic kidney disease, stage 3b: Secondary | ICD-10-CM | POA: Diagnosis not present

## 2024-01-17 DIAGNOSIS — J329 Chronic sinusitis, unspecified: Secondary | ICD-10-CM | POA: Insufficient documentation

## 2024-01-17 DIAGNOSIS — H6123 Impacted cerumen, bilateral: Secondary | ICD-10-CM

## 2024-01-17 DIAGNOSIS — M8580 Other specified disorders of bone density and structure, unspecified site: Secondary | ICD-10-CM | POA: Insufficient documentation

## 2024-01-17 MED ORDER — EZETIMIBE 10 MG PO TABS: 10.0000 mg | ORAL_TABLET | Freq: Every day | ORAL | 1 refills | Status: AC

## 2024-01-17 MED ORDER — HYDROCHLOROTHIAZIDE 25 MG PO TABS: 25.0000 mg | ORAL_TABLET | Freq: Every day | ORAL | 1 refills | Status: AC

## 2024-01-17 MED ORDER — LOSARTAN POTASSIUM 100 MG PO TABS: 100.0000 mg | ORAL_TABLET | Freq: Every day | ORAL | 1 refills | Status: AC

## 2024-01-17 MED ORDER — METOPROLOL SUCCINATE ER 50 MG PO TB24: 50.0000 mg | ORAL_TABLET | Freq: Every day | ORAL | 1 refills | Status: AC

## 2024-01-17 MED ORDER — FLUTICASONE PROPIONATE 50 MCG/ACT NA SUSP: 2.0000 | Freq: Every day | NASAL | 0 refills | Status: DC

## 2024-01-17 NOTE — Assessment & Plan Note (Addendum)
 Follows with pulmonology Dr. Aleskerov.  Currently on Bevespi and albuterol  as needed. Feels sx are at baseline. Reports rarely using albuterol .

## 2024-01-17 NOTE — Assessment & Plan Note (Addendum)
 Well controlled at 112/68 on losartan  100 mg daily, metoprolol  succinate 50 mg daily and hydrochlorothiazide  25 mg daily. Stable kidney function from labs from nephology on  12/05/2023. Continue current medications.

## 2024-01-17 NOTE — Assessment & Plan Note (Addendum)
 BMD 12/30/2022, osteopenia spine and R femoral neck. She does takes vitamin D  and calcium supplements.

## 2024-01-17 NOTE — Assessment & Plan Note (Addendum)
 Stable on omeprazole 40 mg daily

## 2024-01-17 NOTE — Assessment & Plan Note (Signed)
 Stable kidney function  Cr 1.4 with GFR 39 on 9/29, continue to follow with nephology Dr. Marcelino.

## 2024-01-17 NOTE — Assessment & Plan Note (Addendum)
 This seems to be improving. No wheezing, dyspnea, or increase cough. Continue flonase and Claritin She will follow up if not improving.

## 2024-01-17 NOTE — Progress Notes (Signed)
 Established Patient Office Visit  Subjective   Patient ID: Taylor Griffin, female    DOB: 27-Sep-1950  Age: 73 y.o. MRN: [id number]  Chief Complaint  Patient presents with   Hypertension   Hyperlipidemia   Sinusitis    X 1 week   Anemia    Taylor Griffin with medical hx listed below presents today for transfer of care.   Sinus pain and rhinorrhea for the past week is slowly improving. Minimal sinus pain. Feels some draining in the back of of throat. Used coricidin and loratadine with improvement.  Denies fever, chills, dyspnea, cough, wheezing, ear pain/tinnitus, or sore throat. No sick contacts.   Patient Active Problem List   Diagnosis Date Noted   Osteopenia 01/17/2024   CKD (chronic kidney disease), stage III (HCC) 01/17/2024   Sinusitis 01/17/2024   Spondylolysis of cervical spine 10/01/2020   COPD (chronic obstructive pulmonary disease) (HCC) 08/21/2018   Dyshidrotic eczema 07/11/2017   GERD (gastroesophageal reflux disease) 07/11/2017   Hyperlipidemia, unspecified 05/03/2016   Hypertension 05/03/2016      ROS Refer to HPI    Objective:    Outpatient Encounter Medications as of 01/17/2024  Medication Sig   aspirin  81 MG tablet Take 1 tablet (81 mg total) by mouth daily. AM   Calcium Carbonate-Vit D-Min (CALCIUM 1200 PO) Take 1 tablet by mouth daily.   Ergocalciferol  10 MCG (400 UNIT) TABS Take by mouth.   folic acid  ([employer] FOLIC ACID ) 800 MCG tablet TAKE 1 TABLET DAILY. PT NEEDS TO TAKE 1 WHOLE PILL   Glycopyrrolate -Formoterol (BEVESPI AEROSPHERE) 9-4.8 MCG/ACT AERO 2 puffs 2 (two) times daily. Dr Aleskerov   ipratropium-albuterol  (DUONEB) 0.5-2.5 (3) MG/3ML SOLN Inhale 3 mLs into the lungs 3 (three) times daily as needed. Dr A   Multiple Vitamins-Minerals (MULTIVITAMIN WITH MINERALS) tablet Take 1 tablet by mouth daily.   omeprazole  (PRILOSEC) 40 MG capsule Take 1 capsule (40 mg total) by mouth daily.   triamcinolone  cream (KENALOG ) 0.1 % APPLY TO AFFECTED  AREA TWICE A DAY   [DISCONTINUED] cholecalciferol (VITAMIN D3) 25 MCG (1000 UNIT) tablet Take 1,000 Units by mouth daily.   [DISCONTINUED] ezetimibe  (ZETIA ) 10 MG tablet Take 1 tablet (10 mg total) by mouth daily.   [DISCONTINUED] fluticasone (FLONASE) 50 MCG/ACT nasal spray Place 2 sprays into the nose daily. Dr A   [DISCONTINUED] hydrochlorothiazide  (HYDRODIURIL ) 25 MG tablet Take 1 tablet (25 mg total) by mouth daily.   [DISCONTINUED] losartan  (COZAAR ) 100 MG tablet Take 1 tablet (100 mg total) by mouth daily.   [DISCONTINUED] metoprolol  succinate (TOPROL -XL) 50 MG 24 hr tablet Take 1 tablet (50 mg total) by mouth daily. TAKE WITH OR IMMEDIATELY FOLLOWING A MEAL.   ezetimibe  (ZETIA ) 10 MG tablet Take 1 tablet (10 mg total) by mouth daily.   fluticasone (FLONASE) 50 MCG/ACT nasal spray Place 2 sprays into both nostrils daily. Dr A   hydrochlorothiazide  (HYDRODIURIL ) 25 MG tablet Take 1 tablet (25 mg total) by mouth daily.   losartan  (COZAAR ) 100 MG tablet Take 1 tablet (100 mg total) by mouth daily.   metoprolol  succinate (TOPROL -XL) 50 MG 24 hr tablet Take 1 tablet (50 mg total) by mouth daily. TAKE WITH OR IMMEDIATELY FOLLOWING A MEAL.   No facility-administered encounter medications on file as of 01/17/2024.    BP 112/68   Pulse 73   Temp 97.7 F (36.5 C) (Oral)   Ht 5' 6 (1.676 m)   Wt 165 lb (74.8 kg)   SpO2 94%  BMI 26.63 kg/m  BP Readings from Last 3 Encounters:  01/17/24 112/68  07/22/23 122/82  04/01/23 124/78    Physical Exam Constitutional:      Appearance: Normal appearance.  HENT:     Right Ear: There is impacted cerumen.     Left Ear: There is impacted cerumen.     Nose: Congestion and rhinorrhea present.     Mouth/Throat:     Mouth: Mucous membranes are moist.     Pharynx: Oropharynx is clear. No oropharyngeal exudate or posterior oropharyngeal erythema.  Eyes:     Extraocular Movements: Extraocular movements intact.     Pupils: Pupils are equal, round,  and reactive to light.  Cardiovascular:     Rate and Rhythm: Normal rate and regular rhythm.  Pulmonary:     Effort: Pulmonary effort is normal. No respiratory distress.     Breath sounds: Normal breath sounds. No rales. Wheezes: intermittent. Abdominal:     General: Abdomen is flat. Bowel sounds are normal. There is no distension.     Palpations: Abdomen is soft.     Tenderness: There is no abdominal tenderness.  Musculoskeletal:        General: Normal range of motion.     Right lower leg: No edema.     Left lower leg: No edema.  Skin:    General: Skin is warm and dry.     Capillary Refill: Capillary refill takes less than 2 seconds.  Neurological:     General: No focal deficit present.     Mental Status: She is alert and oriented to person, place, and time.  Psychiatric:        Mood and Affect: Mood normal.        Behavior: Behavior normal.        01/17/2024    9:53 AM 07/22/2023   10:11 AM 04/01/2023   10:16 AM  Depression screen PHQ 2/9  Decreased Interest 0 0 0  Down, Depressed, Hopeless 0 0 0  PHQ - 2 Score 0 0 0  Altered sleeping 0  0  Tired, decreased energy 0  0  Change in appetite 0  0  Feeling bad or failure about yourself  0  0  Trouble concentrating 0  0  Moving slowly or fidgety/restless 0  0  Suicidal thoughts 0  0  PHQ-9 Score 0  0   Difficult doing work/chores Not difficult at all  Not difficult at all     Data saved with a previous flowsheet row definition       01/17/2024    9:53 AM 07/22/2023   10:11 AM 04/01/2023   10:16 AM 09/28/2022    9:56 AM  GAD 7 : Generalized Anxiety Score  Nervous, Anxious, on Edge 0 0 0 0  Control/stop worrying 0 0 0 0  Worry too much - different things 0 0 0 0  Trouble relaxing 0 0 0 0  Restless 0 0 0 0  Easily annoyed or irritable 0 0 0 0  Afraid - awful might happen 0 0 0 0  Total GAD 7 Score 0 0 0 0  Anxiety Difficulty Not difficult at all Not difficult at all Not difficult at all Not difficult at all    No  results found for any visits on 01/17/24.  Last CBC Lab Results  Component Value Date   WBC 5.0 07/22/2023   HGB 14.7 07/22/2023   HCT 44.7 07/22/2023   MCV 94 07/22/2023   MCH 30.8  07/22/2023   RDW 13.3 07/22/2023   PLT 367 07/22/2023   Last metabolic panel Lab Results  Component Value Date   GLUCOSE 96 04/01/2023   NA 142 04/01/2023   K 4.2 04/01/2023   CL 99 04/01/2023   CO2 28 04/01/2023   BUN 23 04/01/2023   CREATININE 1.30 (H) 04/01/2023   EGFR 43 (L) 04/01/2023   CALCIUM 9.5 04/01/2023   PHOS 4.5 (H) 03/25/2022   PROT 6.9 04/01/2023   ALBUMIN 4.4 04/01/2023   LABGLOB 2.5 04/01/2023   AGRATIO 1.7 09/21/2021   BILITOT 0.6 04/01/2023   ALKPHOS 83 04/01/2023   AST 22 04/01/2023   ALT 18 04/01/2023   ANIONGAP 16 (H) 01/28/2022   Last lipids Lab Results  Component Value Date   CHOL 199 04/01/2023   HDL 44 04/01/2023   LDLCALC 130 (H) 04/01/2023   TRIG 141 04/01/2023   CHOLHDL 5.2 (H) 07/11/2017    The 10-year ASCVD risk score (Arnett DK, et al., 2019) is: 11.1%    Assessment & Plan:  Acute non-recurrent maxillary sinusitis Assessment & Plan: This seems to be improving. No wheezing, dyspnea, or increase cough. Continue flonase and Claritin She will follow up if not improving.    Centrilobular emphysema (HCC) Assessment & Plan: Follows with pulmonology Dr. Aleskerov.  Currently on Bevespi and albuterol  as needed. Feels sx are at baseline. Reports rarely using albuterol .    Osteopenia of multiple sites Assessment & Plan: BMD 12/30/2022, osteopenia spine and R femoral neck. She does takes vitamin D  and calcium supplements.    Gastroesophageal reflux disease, unspecified whether esophagitis present Assessment & Plan: Stable on omeprazole  40 mg daily.   Stage 3b chronic kidney disease (HCC) Assessment & Plan: Stable kidney function  Cr 1.4 with GFR 39 on 9/29, continue to follow with nephology Dr. Marcelino.     Mixed hyperlipidemia Assessment &  Plan: The 10-year ASCVD risk score (Arnett DK, et al., 2019) is: 11.1%, Is currently on zetia , appears she was on pravastatin  but stopped due to muscle aches in arms, however still have some discomfort in arms intermittently. Lipid panel at next visit.    Primary hypertension Assessment & Plan: Well controlled at 112/68 on losartan  100 mg daily, metoprolol  succinate 50 mg daily and hydrochlorothiazide  25 mg daily. Stable kidney function from labs from nephology on  12/05/2023. Continue current medications.    Hyperlipidemia, unspecified hyperlipidemia type Assessment & Plan: The 10-year ASCVD risk score (Arnett DK, et al., 2019) is: 11.1%, Is currently on zetia , appears she was on pravastatin  but stopped due to muscle aches in arms, however still have some discomfort in arms intermittently. Lipid panel at next visit.   Orders: -     Ezetimibe ; Take 1 tablet (10 mg total) by mouth daily.  Dispense: 90 tablet; Refill: 1  Essential hypertension -     Losartan  Potassium; Take 1 tablet (100 mg total) by mouth daily.  Dispense: 90 tablet; Refill: 1 -     hydroCHLOROthiazide ; Take 1 tablet (25 mg total) by mouth daily.  Dispense: 90 tablet; Refill: 1 -     Metoprolol  Succinate ER; Take 1 tablet (50 mg total) by mouth daily. TAKE WITH OR IMMEDIATELY FOLLOWING A MEAL.  Dispense: 90 tablet; Refill: 1  Encounter for immunization -     Flu vaccine HIGH DOSE PF(Fluzone Trivalent)  Impacted cerumen of both ears -     Ear Lavage  Other orders -     Fluticasone Propionate; Place 2 sprays into  both nostrils daily. Dr A  Dispense: 1 g; Refill: 0     Return in about 6 months (around 07/16/2024) for physical.    Harlene Saddler, MD

## 2024-01-17 NOTE — Assessment & Plan Note (Addendum)
 The 10-year ASCVD risk score (Arnett DK, et al., 2019) is: 11.1%, Is currently on zetia , appears she was on pravastatin  but stopped due to muscle aches in arms, however still have some discomfort in arms intermittently. Lipid panel at next visit.

## 2024-01-19 ENCOUNTER — Ambulatory Visit (INDEPENDENT_AMBULATORY_CARE_PROVIDER_SITE_OTHER): Admitting: Emergency Medicine

## 2024-01-19 VITALS — Ht 66.0 in | Wt 163.0 lb

## 2024-01-19 DIAGNOSIS — Z0000 Encounter for general adult medical examination without abnormal findings: Secondary | ICD-10-CM

## 2024-01-19 DIAGNOSIS — Z1231 Encounter for screening mammogram for malignant neoplasm of breast: Secondary | ICD-10-CM

## 2024-01-19 NOTE — Patient Instructions (Addendum)
 Ms. Junod,  Thank you for taking the time for your Medicare Wellness Visit. I appreciate your continued commitment to your health goals. Please review the care plan we discussed, and feel free to reach out if I can assist you further.  Please note that Annual Wellness Visits do not include a physical exam. Some assessments may be limited, especially if the visit was conducted virtually. If needed, we may recommend an in-person follow-up with your provider.  Ongoing Care Seeing your primary care provider every 3 to 6 months helps us  monitor your health and provide consistent, personalized care.   Referrals If a referral was made during today's visit and you haven't received any updates within two weeks, please contact the referred provider directly to check on the status.  Recommended Screenings:  You may get the covid vaccine at your local pharmacy at your convenience.  Please call to schedule your mammogram:  [facility] at [facility] Address: [address], [place], [place] Phone: [phone]  [facility] [address], [address] [place], [place] [address] Phone: [phone]   Health Maintenance  Topic Date Due   Zoster (Shingles) Vaccine (1 of 2) Never done   Breast Cancer Screening  01/16/2023   Medicare Annual Wellness Visit  10/13/2023   COVID-19 Vaccine (5 - 2025-26 season) 02/02/2024*   DTaP/Tdap/Td vaccine (2 - Td or Tdap) 05/03/2026   Colon Cancer Screening  12/14/2026   DEXA scan (bone density measurement)  12/30/2027   Pneumococcal Vaccine for age over 23  Completed   Flu Shot  Completed   Hepatitis C Screening  Completed   Meningitis B Vaccine  Aged Out  *Topic was postponed. The date shown is not the original due date.       01/19/2024    1:46 PM  Advanced Directives  Does Patient Have a Medical Advance Directive? Yes  Type of Advance Directive Living will  Does patient want to make changes to  medical advance directive? No - Patient declined    Vision: Annual vision screenings are recommended for early detection of glaucoma, cataracts, and diabetic retinopathy. These exams can also reveal signs of chronic conditions such as diabetes and high blood pressure.  Dental: Annual dental screenings help detect early signs of oral cancer, gum disease, and other conditions linked to overall health, including heart disease and diabetes.  Please see the attached documents for additional preventive care recommendations.   Fall Prevention in the Home, Adult Falls can cause injuries and affect people of all ages. There are many simple things that you can do to make your home safe and to help prevent falls. If you need it, ask for help making these changes. What actions can I take to prevent falls? General information Use good lighting in all rooms. Make sure to: Replace any light bulbs that burn out. Turn on lights if it is dark and use night-lights. Keep items that you use often in easy-to-reach places. Lower the shelves around your home if needed. Move furniture so that there are clear paths around it. Do not keep throw rugs or other things on the floor that can make you trip. If any of your floors are uneven, fix them. Add color or contrast paint or tape to clearly mark and help you see: Grab bars or handrails. First and last steps of staircases. Where the edge of each step is. If you use a ladder or stepladder: Make sure that it is fully  opened. Do not climb a closed ladder. Make sure the sides of the ladder are locked in place. Have someone hold the ladder while you use it. Know where your pets are as you move through your home. What can I do in the bathroom?     Keep the floor dry. Clean up any water  that is on the floor right away. Remove soap buildup in the bathtub or shower. Buildup makes bathtubs and showers slippery. Use non-skid mats or decals on the floor of the bathtub  or shower. Attach bath mats securely with double-sided, non-slip rug tape. If you need to sit down while you are in the shower, use a non-slip stool. Install grab bars by the toilet and in the bathtub and shower. Do not use towel bars as grab bars. What can I do in the bedroom? Make sure that you have a light by your bed that is easy to reach. Do not use any sheets or blankets on your bed that hang to the floor. Have a firm bench or chair with side arms that you can use for support when you get dressed. What can I do in the kitchen? Clean up any spills right away. If you need to reach something above you, use a sturdy step stool that has a grab bar. Keep electrical cables out of the way. Do not use floor polish or wax that makes floors slippery. What can I do with my stairs? Do not leave anything on the stairs. Make sure that you have a light switch at the top and the bottom of the stairs. Have them installed if you do not have them. Make sure that there are handrails on both sides of the stairs. Fix handrails that are broken or loose. Make sure that handrails are as long as the staircases. Install non-slip stair treads on all stairs in your home if they do not have carpet. Avoid having throw rugs at the top or bottom of stairs, or secure the rugs with carpet tape to prevent them from moving. Choose a carpet design that does not hide the edge of steps on the stairs. Make sure that carpet is firmly attached to the stairs. Fix any carpet that is loose or worn. What can I do on the outside of my home? Use bright outdoor lighting. Repair the edges of walkways and driveways and fix any cracks. Clear paths of anything that can make you trip, such as tools or rocks. Add color or contrast paint or tape to clearly mark and help you see high doorway thresholds. Trim any bushes or trees on the main path into your home. Check that handrails are securely fastened and in good repair. Both sides of all  steps should have handrails. Install guardrails along the edges of any raised decks or porches. Have leaves, snow, and ice cleared regularly. Use sand, salt, or ice melt on walkways during winter months if you live where there is ice and snow. In the garage, clean up any spills right away, including grease or oil spills. What other actions can I take? Review your medicines with your health care provider. Some medicines can make you confused or feel dizzy. This can increase your chance of falling. Wear closed-toe shoes that fit well and support your feet. Wear shoes that have rubber soles and low heels. Use a cane, walker, scooter, or crutches that help you move around if needed. Talk with your provider about other ways that you can decrease your risk of falls.  This may include seeing a physical therapist to learn to do exercises to improve movement and strength. Where to find more information Centers for Disease Control and Prevention, [employer]: tonerpromos.no [employer] on Aging: baseringtones.pl National Institute on Aging: baseringtones.pl Contact a health care provider if: You are afraid of falling at home. You feel weak, drowsy, or dizzy at home. You fall at home. Get help right away if you: Lose consciousness or have trouble moving after a fall. Have a fall that causes a head injury. These symptoms may be an emergency. Get help right away. Call 911. Do not wait to see if the symptoms will go away. Do not drive yourself to the hospital. This information is not intended to replace advice given to you by your health care provider. Make sure you discuss any questions you have with your health care provider. Document Revised: 10/26/2021 Document Reviewed: 10/26/2021 Elsevier Patient Education  2024 [employer].

## 2024-01-19 NOTE — Progress Notes (Signed)
 Chief Complaint  Patient presents with   Medicare Wellness     Subjective:   Taylor Griffin is a 73 y.o. female who presents for a Medicare Annual Wellness Visit.  Allergies (verified) Latex and Pravastatin  sodium   History: Past Medical History:  Diagnosis Date   Anemia    Anemia due to folic acid  deficiency 05/03/2016   Arthritis    WRIST AND FINGERS   Benign neoplasm of ascending colon    Benign neoplasm of descending colon    Benign neoplasm of sigmoid colon    COPD (chronic obstructive pulmonary disease) (HCC)    Esophageal dysphagia    Gastroesophageal reflux disease 07/11/2017   Heart burn    Hyperlipidemia    Hypertension    CONTROLLED WITH MEDS   Past Surgical History:  Procedure Laterality Date   COLONOSCOPY WITH PROPOFOL  N/A 11/22/2014   Procedure: COLONOSCOPY WITH PROPOFOL ;  Surgeon: Rogelia Copping, MD;  Location: [facility];  Service: Endoscopy;  Laterality: N/A;  LATEX ALLERGY   COLONOSCOPY WITH PROPOFOL  N/A 12/14/2019   Procedure: COLONOSCOPY WITH PROPOFOL ;  Surgeon: Copping Rogelia, MD;  Location: [facility];  Service: Endoscopy;  Laterality: N/A;  priority 4   ECTOPIC PREGNANCY SURGERY     ESOPHAGOGASTRODUODENOSCOPY (EGD) WITH PROPOFOL  N/A 01/26/2017   Procedure: ESOPHAGOGASTRODUODENOSCOPY (EGD) WITH PROPOFOL ;  Surgeon: Unk Corinn Skiff, MD;  Location: [facility];  Service: Endoscopy;  Laterality: N/A;   ESOPHAGOGASTRODUODENOSCOPY (EGD) WITH PROPOFOL  N/A 01/22/2021   Procedure: ESOPHAGOGASTRODUODENOSCOPY (EGD) WITH PROPOFOL ;  Surgeon: Unk Corinn Skiff, MD;  Location: [facility];  Service: Endoscopy;  Laterality: N/A;   FOOT SURGERY Left 05/2023   POLYPECTOMY  12/14/2019   Procedure: POLYPECTOMY;  Surgeon: Copping Rogelia, MD;  Location: [facility];  Service: Endoscopy;;   Family History  Problem Relation Age of Onset   Diabetes Mother    Hypertension Mother    Breast cancer Sister 69   Breast cancer Cousin         2 mat cousins   Social History   Occupational History   Not on file  Tobacco Use   Smoking status: Former    Current packs/day: 0.00    Average packs/day: 0.1 packs/day for 52.0 years (5.2 ttl pk-yrs)    Types: Cigarettes    Start date: 75    Quit date: 2021    Years since quitting: 4.8   Smokeless tobacco: Never   Tobacco comments:    Pt states quit less than 15 years ago in 2023    01/19/24 quit smoking ~ 2021, previous 1 pack would last a week/pbt  Vaping Use   Vaping status: Never Used  Substance and Sexual Activity   Alcohol use: No    Alcohol/week: 0.0 standard drinks of alcohol   Drug use: No   Sexual activity: Never   Tobacco Counseling Counseling given: Not Answered Tobacco comments: Pt states quit less than 15 years ago in 2023 01/19/24 quit smoking ~ 2021, previous 1 pack would last a week/pbt  SDOH Screenings   Food Insecurity: No Food Insecurity (01/19/2024)  Housing: Low Risk  (01/19/2024)  Transportation Needs: No Transportation Needs (01/19/2024)  Utilities: Not At Risk (01/19/2024)  Alcohol Screen: Low Risk  (10/13/2022)  Depression (PHQ2-9): Low Risk  (01/19/2024)  Financial Resource Strain: Low Risk  (07/22/2023)  Physical Activity: Inactive (01/19/2024)  Social Connections: Moderately Integrated (01/19/2024)  Stress: No Stress Concern Present (01/19/2024)  Tobacco Use: Medium Risk (01/19/2024)  Health Literacy: Adequate Health  Literacy (01/19/2024)   See flowsheets for full screening details  Depression Screen PHQ 2 & 9 Depression Scale- Over the past 2 weeks, how often have you been bothered by any of the following problems? Little interest or pleasure in doing things: 0 Feeling down, depressed, or hopeless (PHQ Adolescent also includes...irritable): 0 PHQ-2 Total Score: 0 Trouble falling or staying asleep, or sleeping too much: 0 Feeling tired or having little energy: 0 Poor appetite or overeating (PHQ Adolescent also  includes...weight loss): 0 Feeling bad about yourself - or that you are a failure or have let yourself or your family down: 0 Trouble concentrating on things, such as reading the newspaper or watching television (PHQ Adolescent also includes...like school work): 0 Moving or speaking so slowly that other people could have noticed. Or the opposite - being so fidgety or restless that you have been moving around a lot more than usual: 0 Thoughts that you would be better off dead, or of hurting yourself in some way: 0 PHQ-9 Total Score: 0 If you checked off any problems, how difficult have these problems made it for you to do your work, take care of things at home, or get along with other people?: Not difficult at all  Depression Treatment Depression Interventions/Treatment : EYV7-0 Score <4 Follow-up Not Indicated     Goals Addressed               This Visit's Progress     Weight (lb) < 140 lb (63.5 kg) (pt-stated)   163 lb (73.9 kg)     COMPLETED: Weight (lb) < 200 lb (90.7 kg)   163 lb (73.9 kg)      Visit info / Clinical Intake: Medicare Wellness Visit Type:: Subsequent Annual Wellness Visit Persons participating in visit:: patient Medicare Wellness Visit Mode:: Telephone If telephone:: video declined Because this visit was a virtual/telehealth visit:: pt reported vitals If Telephone or Video please confirm:: I connected with the patient using audio enabled telemedicine application and verified that I am speaking with the correct person using two identifiers; I discussed the limitations of evaluation and management by telemedicine; The patient expressed understanding and agreed to proceed Patient Location:: work Dispensing Optician:: home Information given by:: patient Interpreter Needed?: No Pre-visit prep was completed: yes AWV questionnaire completed by patient prior to visit?: no Living arrangements:: lives with spouse/significant other Patient's Overall Health Status Rating:  very good Typical amount of pain: none Does pain affect daily life?: no Are you currently prescribed opioids?: no  Dietary Habits and Nutritional Risks How many meals a day?: 2 Eats fruit and vegetables daily?: (!) no Most meals are obtained by: eating out In the last 2 weeks, have you had any of the following?: none Diabetic:: no  Functional Status Activities of Daily Living (to include ambulation/medication): Independent Ambulation: Independent with device- listed below Home Assistive Devices/Equipment: Eyeglasses Medication Administration: Independent Home Management: Independent Manage your own finances?: yes Primary transportation is: driving Concerns about vision?: no *vision screening is required for WTM* Concerns about hearing?: no  Fall Screening Falls in the past year?: 0 Number of falls in past year: 0 Was there an injury with Fall?: 0 Fall Risk Category Calculator: 0 Patient Fall Risk Level: Low Fall Risk  Fall Risk Patient at Risk for Falls Due to: No Fall Risks Fall risk Follow up: Falls evaluation completed  Home and Transportation Safety: All rugs have non-skid backing?: yes All stairs or steps have railings?: (!) no (3 steps without handrail) Grab  bars in the bathtub or shower?: yes Have non-skid surface in bathtub or shower?: yes Good home lighting?: yes Regular seat belt use?: yes Hospital stays in the last year:: no  Cognitive Assessment Difficulty concentrating, remembering, or making decisions? : no Will 6CIT or Mini Cog be Completed: yes What year is it?: 0 points What month is it?: 0 points Give patient an address phrase to remember (5 components): [address] [place] About what time is it?: 0 points Count backwards from 20 to 1: 0 points Say the months of the year in reverse: 0 points Repeat the address phrase from earlier: 0 points 6 CIT Score: 0 points  Advance Directives (For Healthcare) Does Patient Have a Medical Advance  Directive?: Yes Does patient want to make changes to medical advance directive?: No - Patient declined Type of Advance Directive: Living will Copy of Living Will in Chart?: Yes - validated most recent copy scanned in chart (See row information)  Reviewed/Updated  Reviewed/Updated: Reviewed All (Medical, Surgical, Family, Medications, Allergies, Care Teams, Patient Goals)        Objective:    Today's Vitals   01/19/24 1336  Weight: 163 lb (73.9 kg)  Height: 5' 6 (1.676 m)   Body mass index is 26.31 kg/m.  Current Medications (verified) Outpatient Encounter Medications as of 01/19/2024  Medication Sig   aspirin  81 MG tablet Take 1 tablet (81 mg total) by mouth daily. AM   Calcium Carbonate-Vit D-Min (CALCIUM 1200 PO) Take 1 tablet by mouth daily.   Ergocalciferol  10 MCG (400 UNIT) TABS Take by mouth.   ezetimibe  (ZETIA ) 10 MG tablet Take 1 tablet (10 mg total) by mouth daily.   fluticasone (FLONASE) 50 MCG/ACT nasal spray Place 2 sprays into both nostrils daily. Dr A   folic acid  ([employer] FOLIC ACID ) 800 MCG tablet TAKE 1 TABLET DAILY. PT NEEDS TO TAKE 1 WHOLE PILL   Glycopyrrolate -Formoterol (BEVESPI AEROSPHERE) 9-4.8 MCG/ACT AERO 2 puffs 2 (two) times daily. Dr Aleskerov   hydrochlorothiazide  (HYDRODIURIL ) 25 MG tablet Take 1 tablet (25 mg total) by mouth daily.   ipratropium-albuterol  (DUONEB) 0.5-2.5 (3) MG/3ML SOLN Inhale 3 mLs into the lungs 3 (three) times daily as needed. Dr A   losartan  (COZAAR ) 100 MG tablet Take 1 tablet (100 mg total) by mouth daily.   metoprolol  succinate (TOPROL -XL) 50 MG 24 hr tablet Take 1 tablet (50 mg total) by mouth daily. TAKE WITH OR IMMEDIATELY FOLLOWING A MEAL.   Multiple Vitamins-Minerals (MULTIVITAMIN WITH MINERALS) tablet Take 1 tablet by mouth daily.   omeprazole  (PRILOSEC) 40 MG capsule Take 1 capsule (40 mg total) by mouth daily.   triamcinolone  cream (KENALOG ) 0.1 % APPLY TO AFFECTED AREA TWICE A DAY (Patient not taking: Reported on  01/19/2024)   No facility-administered encounter medications on file as of 01/19/2024.   Hearing/Vision screen Hearing Screening - Comments:: Denies hearing loss  Vision Screening - Comments:: UTD @ [facility] [place] Immunizations and Health Maintenance Health Maintenance  Topic Date Due   Zoster Vaccines- Shingrix (1 of 2) Never done   Mammogram  01/16/2023   COVID-19 Vaccine (5 - 2025-26 season) 02/02/2024 (Originally 11/07/2023)   Medicare Annual Wellness (AWV)  01/18/2025   DTaP/Tdap/Td (2 - Td or Tdap) 05/03/2026   Colonoscopy  12/14/2026   DEXA SCAN  12/30/2027   Pneumococcal Vaccine: 50+ Years  Completed   Influenza Vaccine  Completed   Hepatitis C Screening  Completed   Meningococcal B  Vaccine  Aged Out        Assessment/Plan:  This is a routine wellness examination for Taylor Griffin.  Patient Care Team: Lemon Raisin, MD as PCP - General (Internal Medicine) Marcelino Gales, MD (Nephrology) Parris Manna, MD as Consulting Physician (Pulmonary Disease) Silva Juliene SAUNDERS, DPM as Consulting Physician (Podiatry) [facility])  I have personally reviewed and noted the following in the patient's chart:   Medical and social history Use of alcohol, tobacco or illicit drugs  Current medications and supplements including opioid prescriptions. Functional ability and status Nutritional status Physical activity Advanced directives List of other physicians Hospitalizations, surgeries, and ER visits in previous 12 months Vitals Screenings to include cognitive, depression, and falls Referrals and appointments  Orders Placed This Encounter  Procedures   MM 3D SCREENING MAMMOGRAM BILATERAL BREAST    Standing Status:   Future    Expected Date:   01/20/2024    Expiration Date:   01/18/2025    Reason for Exam (SYMPTOM  OR DIAGNOSIS REQUIRED):   screening for breast cancer    Preferred imaging location?:    [facility]   In addition, I have reviewed and discussed with patient certain preventive protocols, quality metrics, and best practice recommendations. A written personalized care plan for preventive services as well as general preventive health recommendations were provided to patient.   Vina Ned, CMA   01/19/2024   Return in 55 weeks (on 02/07/2025).  After Visit Summary: (MyChart) Due to this being a telephonic visit, the after visit summary with patients personalized plan was offered to patient via MyChart   Nurse Notes:  Placed order for a MMG May get Covid vaccine (pharmacy) Declined shingles vaccine

## 2024-02-08 ENCOUNTER — Other Ambulatory Visit: Payer: Self-pay | Admitting: Student

## 2024-02-10 NOTE — Telephone Encounter (Signed)
 Requested Prescriptions  Pending Prescriptions Disp Refills   fluticasone  (FLONASE ) 50 MCG/ACT nasal spray [Pharmacy Med Name: FLUTICASONE  PROP 50 MCG SPRAY] 48 mL 1    Sig: PLACE 2 SPRAYS INTO BOTH NOSTRILS DAILY.     Ear, Nose, and Throat: Nasal Preparations - Corticosteroids Passed - 02/10/2024  3:15 PM      Passed - Valid encounter within last 12 months    Recent Outpatient Visits           3 weeks ago Acute non-recurrent maxillary sinusitis   [place], MD   6 months ago Essential hypertension    [facility] at [facility] Cathryne [facility]YSON, MD

## 2024-07-17 ENCOUNTER — Encounter: Admitting: Student

## 2025-02-07 ENCOUNTER — Ambulatory Visit
